# Patient Record
Sex: Female | Born: 1995 | Race: White | Hispanic: No | Marital: Single | Smoking: Former smoker
Health system: Southern US, Community
[De-identification: ages and names within clinical notes are randomized; demographics above are authoritative.]

## PROBLEM LIST (undated history)

## (undated) DIAGNOSIS — K589 Irritable bowel syndrome without diarrhea: Secondary | ICD-10-CM

## (undated) DIAGNOSIS — F419 Anxiety disorder, unspecified: Secondary | ICD-10-CM

## (undated) DIAGNOSIS — G709 Myoneural disorder, unspecified: Secondary | ICD-10-CM

## (undated) DIAGNOSIS — R200 Anesthesia of skin: Secondary | ICD-10-CM

## (undated) DIAGNOSIS — M351 Other overlap syndromes: Secondary | ICD-10-CM

## (undated) DIAGNOSIS — F32A Depression, unspecified: Secondary | ICD-10-CM

## (undated) HISTORY — DX: Anesthesia of skin: R20.0

## (undated) HISTORY — DX: Depression, unspecified: F32.A

## (undated) HISTORY — DX: Other overlap syndromes: M35.1

## (undated) HISTORY — DX: Myoneural disorder, unspecified: G70.9

## (undated) HISTORY — DX: Irritable bowel syndrome, unspecified: K58.9

## (undated) HISTORY — PX: TONSILLECTOMY AND ADENOIDECTOMY: SUR1326

## (undated) HISTORY — PX: HERNIA REPAIR: SHX51

## (undated) HISTORY — DX: Anxiety disorder, unspecified: F41.9

---

## 2013-04-28 ENCOUNTER — Ambulatory Visit: Payer: Self-pay | Admitting: Otolaryngology

## 2013-04-29 LAB — PATHOLOGY REPORT

## 2013-09-08 ENCOUNTER — Ambulatory Visit: Payer: Self-pay | Admitting: Internal Medicine

## 2015-03-23 NOTE — Op Note (Signed)
PATIENT NAME:  Joanne Wallace, [name] MR#:  [id number] DATE OF BIRTH:  Oct 08, 1996  DATE OF PROCEDURE:  04/28/2013  SURGEON:  Zackery BarefootJ. Madison Zinedine Ellner, MD   PREOPERATIVE DIAGNOSIS: Adenotonsillar hypertrophy with chronic adenotonsillitis.   POSTOPERATIVE DIAGNOSIS: Adenotonsillar hypertrophy with chronic adenotonsillitis.   PROCEDURE:  Tonsillectomy and adenoidectomy.   ANESTHESIA:  General endotracheal.  DESCRIPTION OF THE PROCEDURE:  The patient was identified in the holding area and taken to the operating room and placed in the supine position.  After general endotracheal anesthesia, the table was turned 45 degrees and the patient was draped in the usual fashion for a tonsillectomy.  A mouth gag was inserted into the oral cavity and examination of the oropharynx showed the uvula was non-bifid.  There was no evidence of submucous cleft to the palate.  A red rubber catheter was placed through the nostril.  Examination of the nasopharynx showed large obstructing adenoids.  Under indirect vision with the mirror, an adenotome was placed in the nasopharynx.  The adenoids were curetted free.  Reinspection with a mirror showed excellent removal of the adenoid.  Nasopharyngeal packs were then placed.  The nasopharyngeal packs were removed.  Suction cautery was then used to cauterize the nasopharyngeal bed to prevent bleeding.  The red rubber catheter was removed with no active bleeding.  The patient tolerated the procedure well and was awakened in the operating room and taken to the recovery room in stable condition.   CULTURES:  None.  SPECIMENS:  Adenoids.  ESTIMATED BLOOD LOSS:  Less than 10 ml.      FINDINGS: The adenoids were 3+, tonsils were 4+, chronic cryptic purulence was expressed with the tonsil tenaculum. Total amount of local used was 5 mL.   DESCRIPTION OF THE PROCEDURE: The patient was identified in the holding area and taken to the operating room and placed in the supine position.  After general  endotracheal anesthesia, the table was turned 45 degrees and the patient was draped in the usual fashion for a tonsillectomy.  A mouth gag was inserted into the oral cavity and examination of the oropharynx showed the uvula was non-bifid.  There was no evidence of submucous cleft to the palate.  There were large tonsils.  Beginning on the left-hand side a tenaculum was used to grasp the tonsil and the Bovie cautery was used to dissect it free from the fossa.  In a similar fashion, the right tonsil was removed.  Meticulous hemostasis was achieved using the Bovie cautery.  With both tonsils removed and no active bleeding, 0.5% plain Marcaine was used to inject the anterior and posterior tonsillar pillars bilaterally.  A total of 5 mL was used.  The patient tolerated the procedure well and was awakened in the operating room and taken to the recovery room in stable condition.   CULTURES:  None.  SPECIMENS:  Tonsils.  ESTIMATED BLOOD LOSS:  Less than 10 mL.     ____________________________ J. Gertie BaronMadison Hiawatha Dressel, MD [id number] D: 04/28/2013 15:27:07 ET T: 04/29/2013 02:04:59 ET JOB#: [id number]  cc: Zackery BarefootJ. Madison Omarrion Carmer, MD, <Dictator> Wendee CoppJMADISON Kemi Gell MD ELECTRONICALLY SIGNED 05/25/2013 7:45

## 2019-05-02 ENCOUNTER — Ambulatory Visit (INDEPENDENT_AMBULATORY_CARE_PROVIDER_SITE_OTHER): Payer: Commercial Managed Care - PPO | Admitting: Internal Medicine

## 2019-05-02 ENCOUNTER — Other Ambulatory Visit: Payer: Self-pay

## 2019-05-02 ENCOUNTER — Encounter: Payer: Self-pay | Admitting: Internal Medicine

## 2019-05-02 DIAGNOSIS — Z13220 Encounter for screening for lipoid disorders: Secondary | ICD-10-CM | POA: Diagnosis not present

## 2019-05-02 DIAGNOSIS — K589 Irritable bowel syndrome without diarrhea: Secondary | ICD-10-CM | POA: Diagnosis not present

## 2019-05-02 DIAGNOSIS — N926 Irregular menstruation, unspecified: Secondary | ICD-10-CM | POA: Diagnosis not present

## 2019-05-02 DIAGNOSIS — R5383 Other fatigue: Secondary | ICD-10-CM | POA: Diagnosis not present

## 2019-05-02 NOTE — Progress Notes (Signed)
Patient ID: Joanne Wallace, female   DOB: [birth year], 23 y.o.   MRN: [id number]   Virtual Visit via video Note  This visit type was conducted due to national recommendations for restrictions regarding the COVID-19 pandemic (e.g. social distancing).  This format is felt to be most appropriate for this patient at this time.  All issues noted in this document were discussed and addressed.  No physical exam was performed (except for noted visual exam findings with Video Visits).   I connected with Joanne Wallace by a video enabled telemedicine application and verified that I am speaking with the correct person using two identifiers. Location patient: home Location provider: work  Persons participating in the virtual visit: patient, provider  I discussed the limitations, risks, security and privacy concerns of performing an evaluation and management service by video and the availability of in person appointments.. The patient expressed understanding and agreed to proceed.   Reason for visit: establish care  HPI: She recently moved back home a couple of months ago.  Working at [employer].  Tries to stay active.  No chest pain.  No sob.  No acid reflux.  No abdominal pain.  Bowels moving.  Has regular periods.  States - occurs every 31 days.  Increased cramping.  Takes alleve.  Heavy cycles.  States has pain with ovulating.  Request further evaluation for possible endometriosis.  Discussed gyn evaluation.  Has a diagnosis of IBS.  Has previously been prescribed amitriptyline.  Did not tolerate.  Changed diet and now bowels are doing better.  Smoked from age [age].  Stopped smoking now.  Does smoke marijuana.     ROS: See pertinent positives and negatives per HPI.  Past Medical History:  Diagnosis Date  . IBS (irritable bowel syndrome)     Past Surgical History:  Procedure Laterality Date  . HERNIA REPAIR    . TONSILLECTOMY AND ADENOIDECTOMY      Family History  Problem  Relation Age of Onset  . Breast cancer Mother     SOCIAL HX: reviewed.    Current Outpatient Medications:  .  levocetirizine (XYZAL) 5 MG tablet, Take 5 mg by mouth every evening., Disp: , Rfl:   EXAM:  GENERAL: alert.  Answering questions appropriately.  Sounds to be in no acute distress.    PSYCH/NEURO: pleasant and cooperative, no obvious depression or anxiety, speech and thought processing grossly intact  ASSESSMENT AND PLAN:  Discussed the following assessment and plan:  Screening cholesterol level - Plan: Lipid panel  Irritable bowel syndrome, unspecified type  Menstrual changes - Plan: TSH  Other fatigue - Plan: CBC with Differential/Platelet, Comprehensive metabolic panel  IBS (irritable bowel syndrome) Doing better since she adjusted her diet.  Follow.    Menstrual changes Increased pain, cramping and bleeding with her menstrual cycle.  Request referral to gyn.  Has never had pap smear.   Fatigue Check routine labs.      I discussed the assessment and treatment plan with the patient. The patient was provided an opportunity to ask questions and all were answered. The patient agreed with the plan and demonstrated an understanding of the instructions.   The patient was advised to call back or seek an in-person evaluation if the symptoms worsen or if the condition fails to improve as anticipated.  I provided 30 minutes of non-face-to-face time during this encounter.   D[place], MD

## 2019-05-07 ENCOUNTER — Encounter: Payer: Self-pay | Admitting: Internal Medicine

## 2019-05-07 DIAGNOSIS — R5383 Other fatigue: Secondary | ICD-10-CM | POA: Insufficient documentation

## 2019-05-07 DIAGNOSIS — N926 Irregular menstruation, unspecified: Secondary | ICD-10-CM | POA: Insufficient documentation

## 2019-05-07 DIAGNOSIS — K589 Irritable bowel syndrome without diarrhea: Secondary | ICD-10-CM | POA: Insufficient documentation

## 2019-05-07 NOTE — Assessment & Plan Note (Signed)
Check routine labs.   

## 2019-05-07 NOTE — Assessment & Plan Note (Signed)
Increased pain, cramping and bleeding with her menstrual cycle.  Request referral to gyn.  Has never had pap smear.

## 2019-05-07 NOTE — Assessment & Plan Note (Signed)
Doing better since she adjusted her diet.  Follow.

## 2019-06-21 ENCOUNTER — Ambulatory Visit: Payer: Self-pay | Admitting: Internal Medicine

## 2019-08-12 ENCOUNTER — Ambulatory Visit: Payer: Commercial Managed Care - PPO | Admitting: Family Medicine

## 2019-09-07 ENCOUNTER — Ambulatory Visit: Payer: Self-pay | Admitting: Family Medicine

## 2020-06-14 ENCOUNTER — Other Ambulatory Visit: Payer: Self-pay

## 2020-06-14 ENCOUNTER — Encounter: Payer: Self-pay | Admitting: Nurse Practitioner

## 2020-06-14 ENCOUNTER — Ambulatory Visit: Payer: 59 | Admitting: Nurse Practitioner

## 2020-06-14 VITALS — BP 118/72 | HR 82 | Temp 97.7°F | Ht 65.4 in | Wt 161.0 lb

## 2020-06-14 DIAGNOSIS — Z8659 Personal history of other mental and behavioral disorders: Secondary | ICD-10-CM

## 2020-06-14 DIAGNOSIS — Z1159 Encounter for screening for other viral diseases: Secondary | ICD-10-CM

## 2020-06-14 DIAGNOSIS — Z114 Encounter for screening for human immunodeficiency virus [HIV]: Secondary | ICD-10-CM

## 2020-06-14 DIAGNOSIS — M79642 Pain in left hand: Secondary | ICD-10-CM

## 2020-06-14 DIAGNOSIS — Z13228 Encounter for screening for other metabolic disorders: Secondary | ICD-10-CM

## 2020-06-14 DIAGNOSIS — M79641 Pain in right hand: Secondary | ICD-10-CM

## 2020-06-14 DIAGNOSIS — K589 Irritable bowel syndrome without diarrhea: Secondary | ICD-10-CM | POA: Diagnosis not present

## 2020-06-14 NOTE — Progress Notes (Signed)
This visit occurred during the SARS-CoV-2 public health emergency.  Safety protocols were in place, including screening questions prior to the visit, additional usage of staff PPE, and extensive cleaning of exam room while observing appropriate contact time as indicated for disinfecting solutions.  Subjective:     Patient ID: Joanne Wallace , female    DOB: December 13, 1995 , 24 y.o.   MRN: [id number]   Chief Complaint  Patient presents with  . Establish Care    HPI  Here to establish care she found [place] with approved providers with her insurance.  She has recently moved here from [facility]. She has been to see Dr Amedeo Plenty in [facility].  She is working in [employer].  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  Single, she lives with her boyfriend. No children.  She has not seen a GYN.  She is sexually active.  She has had the Covid vaccine.  Her parents live in [place].   She has been exercising more and eating a vegan.   Aunt with Rheumatoid Arthritis, she feels her father has some sort of arthritis. Also multiple aunts with osteoarthritis.   PMH - IBS but has improved since going vegan (no fish or shrimp for the last 2 months), hand pain (she has had improvement as well).  Seasonal depression and advised she is Bipolar but not taking any medications.   She was a smoker for 7 years and has not smoked in 1 year 4 months.     Past Medical History:  Diagnosis Date  . IBS (irritable bowel syndrome)      Family History  Problem Relation Age of Onset  . Breast cancer Mother   . Hyperthyroidism Mother   . Hyperthyroidism Father      Current Outpatient Medications:  .  levocetirizine (XYZAL) 5 MG tablet, Take 5 mg by mouth every evening., Disp: , Rfl:  .  Multiple Vitamin (MULTIVITAMIN WITH MINERALS) TABS tablet, Take 1 tablet by mouth daily., Disp: , Rfl:    Allergies  Allergen Reactions  . Latex Itching     Review of Systems  Constitutional: Negative.  Negative for  fatigue.  Eyes: Negative for visual disturbance.  Respiratory: Negative.  Negative for shortness of breath.   Cardiovascular: Negative.  Negative for chest pain, palpitations and leg swelling.  Gastrointestinal: Negative.   Endocrine: Negative.   Musculoskeletal: Negative.   Skin: Negative.   Neurological: Negative for dizziness, weakness and headaches.  Psychiatric/Behavioral: Negative for confusion. The patient is not nervous/anxious.      Today's Vitals   06/14/20 1551  BP: 118/72  Pulse: 82  Temp: 97.7 F (36.5 C)  TempSrc: Oral  Weight: 161 lb (73 kg)  Height: 5' 5.4" (1.661 m)  PainSc: 0-No pain   Body mass index is 26.47 kg/m.   Objective:  Physical Exam Vitals reviewed.  Constitutional:      General: She is not in acute distress.    Appearance: Normal appearance. She is well-developed.  HENT:     Head: Normocephalic and atraumatic.  Eyes:     Pupils: Pupils are equal, round, and reactive to light.  Cardiovascular:     Rate and Rhythm: Normal rate and regular rhythm.     Pulses: Normal pulses.     Heart sounds: Normal heart sounds. No murmur heard.   Pulmonary:     Effort: Pulmonary effort is normal.     Breath sounds: Normal breath sounds.  Musculoskeletal:  General: Normal range of motion.  Skin:    General: Skin is warm and dry.     Capillary Refill: Capillary refill takes less than 2 seconds.  Neurological:     General: No focal deficit present.     Mental Status: She is alert and oriented to person, place, and time.     Cranial Nerves: No cranial nerve deficit.  Psychiatric:        Mood and Affect: Mood normal.         Assessment And Plan:     1. Irritable bowel syndrome, unspecified type  Reports this as being chronic, which has improved since changing her diet to plant based  2. Bilateral hand pain  Will check autoimmune panel due to family history of rheumatoid arthritis - Autoimmune Profile - Sed Rate (ESR)  3. History of  depression  Would like to go for counseling vs taking medications   She would like to have an established counselor - Ambulatory referral to Psychology  4. Encounter for screening for metabolic disorder - Lipid panel - CMP14+EGFR - CBC  5. Encounter for hepatitis C screening test for low risk patient  Will check Hepatitis C screening due to recent recommendations to screen all adults 18 years and older - Hepatitis C antibody  6. Encounter for screening for HIV - HIV Antibody (routine testing w rflx)     Patient was given opportunity to ask questions. Patient verbalized understanding of the plan and was able to repeat key elements of the plan. All questions were answered to their satisfaction.  Minette Brine, FNP   I, Minette Brine, FNP, have reviewed all documentation for this visit. The documentation on 06/21/20 for the exam, diagnosis, procedures, and orders are all accurate and complete.   THE PATIENT IS ENCOURAGED TO PRACTICE SOCIAL DISTANCING DUE TO THE COVID-19 PANDEMIC.

## 2020-06-15 LAB — CBC
Hematocrit: 40 % (ref 34.0–46.6)
Hemoglobin: 13.9 g/dL (ref 11.1–15.9)
MCH: 29.7 pg (ref 26.6–33.0)
MCHC: 34.8 g/dL (ref 31.5–35.7)
MCV: 86 fL (ref 79–97)
Platelets: 293 10*3/uL (ref 150–450)
RBC: 4.68 x10E6/uL (ref 3.77–5.28)
RDW: 12.5 % (ref 11.7–15.4)
WBC: 7.3 10*3/uL (ref 3.4–10.8)

## 2020-06-15 LAB — CMP14+EGFR
ALT: 9 IU/L (ref 0–32)
AST: 15 IU/L (ref 0–40)
Albumin/Globulin Ratio: 1.6 (ref 1.2–2.2)
Albumin: 4.6 g/dL (ref 3.9–5.0)
Alkaline Phosphatase: 55 IU/L (ref 48–121)
BUN/Creatinine Ratio: 9 (ref 9–23)
BUN: 6 mg/dL (ref 6–20)
Bilirubin Total: 0.4 mg/dL (ref 0.0–1.2)
CO2: 24 mmol/L (ref 20–29)
Calcium: 9.6 mg/dL (ref 8.7–10.2)
Chloride: 103 mmol/L (ref 96–106)
Creatinine, Ser: 0.67 mg/dL (ref 0.57–1.00)
GFR calc Af Amer: 140 mL/min/{1.73_m2} (ref >59)
GFR calc non Af Amer: 122 mL/min/{1.73_m2} (ref >59)
Globulin, Total: 2.8 g/dL (ref 1.5–4.5)
Glucose: 80 mg/dL (ref 65–99)
Potassium: 4.1 mmol/L (ref 3.5–5.2)
Sodium: 139 mmol/L (ref 134–144)
Total Protein: 7.4 g/dL (ref 6.0–8.5)

## 2020-06-15 LAB — AUTOIMMUNE PROFILE
Anti Nuclear Antibody (ANA): POSITIVE — AB
Complement C3, Serum: 117 mg/dL (ref 82–167)
dsDNA Ab: 2 IU/mL (ref 0–9)

## 2020-06-15 LAB — SEDIMENTATION RATE: Sed Rate: 9 mm/hr (ref 0–32)

## 2020-06-15 LAB — HIV ANTIBODY (ROUTINE TESTING W REFLEX): HIV Screen 4th Generation wRfx: NONREACTIVE

## 2020-06-15 LAB — LIPID PANEL
Chol/HDL Ratio: 3.7 ratio (ref 0.0–4.4)
Cholesterol, Total: 139 mg/dL (ref 100–199)
HDL: 38 mg/dL — ABNORMAL LOW (ref >39)
LDL Chol Calc (NIH): 82 mg/dL (ref 0–99)
Triglycerides: 104 mg/dL (ref 0–149)
VLDL Cholesterol Cal: 19 mg/dL (ref 5–40)

## 2020-06-15 LAB — HEPATITIS C ANTIBODY: Hep C Virus Ab: <0.1 s/co ratio (ref 0.0–0.9)

## 2020-06-21 ENCOUNTER — Other Ambulatory Visit: Payer: Self-pay | Admitting: Nurse Practitioner

## 2020-06-21 DIAGNOSIS — R768 Other specified abnormal immunological findings in serum: Secondary | ICD-10-CM

## 2020-08-13 ENCOUNTER — Ambulatory Visit: Payer: Self-pay

## 2020-08-13 ENCOUNTER — Other Ambulatory Visit: Payer: Self-pay

## 2020-08-13 ENCOUNTER — Ambulatory Visit (INDEPENDENT_AMBULATORY_CARE_PROVIDER_SITE_OTHER): Payer: 59 | Admitting: Rheumatology

## 2020-08-13 ENCOUNTER — Encounter: Payer: Self-pay | Admitting: Rheumatology

## 2020-08-13 VITALS — BP 111/71 | HR 75 | Resp 15 | Ht 65.75 in | Wt 163.0 lb

## 2020-08-13 DIAGNOSIS — M79642 Pain in left hand: Secondary | ICD-10-CM | POA: Diagnosis not present

## 2020-08-13 DIAGNOSIS — F329 Major depressive disorder, single episode, unspecified: Secondary | ICD-10-CM

## 2020-08-13 DIAGNOSIS — R768 Other specified abnormal immunological findings in serum: Secondary | ICD-10-CM

## 2020-08-13 DIAGNOSIS — R5383 Other fatigue: Secondary | ICD-10-CM

## 2020-08-13 DIAGNOSIS — M79671 Pain in right foot: Secondary | ICD-10-CM | POA: Diagnosis not present

## 2020-08-13 DIAGNOSIS — M2550 Pain in unspecified joint: Secondary | ICD-10-CM | POA: Diagnosis not present

## 2020-08-13 DIAGNOSIS — F32A Depression, unspecified: Secondary | ICD-10-CM

## 2020-08-13 DIAGNOSIS — M79672 Pain in left foot: Secondary | ICD-10-CM | POA: Diagnosis not present

## 2020-08-13 DIAGNOSIS — F419 Anxiety disorder, unspecified: Secondary | ICD-10-CM

## 2020-08-13 DIAGNOSIS — M79641 Pain in right hand: Secondary | ICD-10-CM

## 2020-08-13 DIAGNOSIS — Z8261 Family history of arthritis: Secondary | ICD-10-CM

## 2020-08-13 DIAGNOSIS — J302 Other seasonal allergic rhinitis: Secondary | ICD-10-CM

## 2020-08-13 DIAGNOSIS — Z8719 Personal history of other diseases of the digestive system: Secondary | ICD-10-CM

## 2020-08-13 DIAGNOSIS — Z7189 Other specified counseling: Secondary | ICD-10-CM

## 2020-08-13 DIAGNOSIS — Z8269 Family history of other diseases of the musculoskeletal system and connective tissue: Secondary | ICD-10-CM

## 2020-08-13 NOTE — Progress Notes (Signed)
Office Visit Note  Patient: Joanne Wallace             Date of Birth: June 10, 1996           MRN: [id number]             PCP: Minette Brine, FNP Referring: Minette Brine, FNP Visit Date: 08/13/2020 Occupation: @GUAROCC @  Subjective:  Pain in multiple joints.   History of Present Illness: Joanne Wallace is a 24 y.o. female seen in consultation per request of her PCP.  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  She states she was working very hard and started experiencing tightness in both of her forearms and fingers.  She states she will experience throbbing pain in the middle of the night.  She had tests for nerve conduction velocity which was negative for carpal tunnel syndrome.  She quit her job in December 2020.  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  She states she is very active there she works on the computer and also lift objects as needed.  She switched her diet to vegan diet in May 2021 which helped to some extent.  She states she continues to have pain and discomfort in multiple joints.  She has pain and swelling in her bilateral hands.  She has noticed discomfort in her elbows, hips, knees and feet.  She has noted swelling in her hands, knee joints and her feet.  She has difficulty making a fist with her hands.  She does yoga for exercise which helps her to some extent.  She has longstanding history of depression and anxiety.  She states she is not seeing a counselor at this time.  There is no history of oral ulcers, nasal ulcers, malar rash, photosensitivity, sicca symptoms, Raynaud's phenomenon or lymphadenopathy.  Paternal aunt has rheumatoid arthritis.  He is gravida 0.  No history of blood clots.  Activities of Daily Living:  Patient  reports morning stiffness for several  hours.   Patient Denies nocturnal pain.  Difficulty dressing/grooming: Denies Difficulty climbing stairs: Reports Difficulty getting out of chair: Reports Difficulty using hands for taps, buttons, cutlery, and/or writing: Reports  Review of Systems  Constitutional: Positive for fatigue. Negative for night sweats, weight gain and weight loss.  HENT: Negative for mouth sores, trouble swallowing, trouble swallowing, mouth dryness and nose dryness.   Eyes: Negative for pain, redness, itching, visual disturbance and dryness.  Respiratory: Negative for cough, shortness of breath and difficulty breathing.   Cardiovascular: Negative for chest pain, palpitations, hypertension, irregular heartbeat and swelling in legs/feet.  Gastrointestinal: Negative for blood in stool, constipation and diarrhea.  Endocrine: Negative for increased urination.  Genitourinary: Negative for difficulty urinating and vaginal dryness.  Musculoskeletal: Positive for arthralgias, joint pain, joint swelling and morning stiffness. Negative for myalgias, muscle weakness, muscle tenderness and myalgias.  Skin: Negative for color change, rash, hair loss, redness, skin tightness, ulcers and sensitivity to sunlight.  Allergic/Immunologic: Negative for susceptible to infections.  Neurological: Positive for weakness. Negative for dizziness, numbness, headaches, memory loss and night sweats.  Hematological: Negative for bruising/bleeding tendency and swollen glands.  Psychiatric/Behavioral: Positive for depressed mood and sleep disturbance. Negative for confusion. The patient is nervous/anxious.     [place]  History:  Patient Active Problem List   Diagnosis Date Noted  . IBS (irritable bowel syndrome) 05/07/2019  . Menstrual changes 05/07/2019  . Fatigue 05/07/2019    Past Medical History:  Diagnosis Date  . IBS (irritable bowel syndrome)     Family History  Problem Relation Age of Onset    . Breast cancer Mother   . Hypothyroidism Mother   . Hypothyroidism Father   . Healthy Brother   . Rheum arthritis Paternal Aunt   . Arthritis Maternal Grandmother   . Osteoarthritis Paternal Aunt    Past Surgical History:  Procedure Laterality Date  . HERNIA REPAIR     double  . TONSILLECTOMY AND ADENOIDECTOMY     Social History   Social History Narrative  . Not on file   Immunization History  Administered Date(s) Administered  . Moderna SARS-COVID-2 Vaccination 02/09/2020, 03/08/2020     Objective: Vital Signs: BP 111/71 (BP Location: Right Arm, Patient Position: Sitting, Cuff Size: Normal)   Pulse 75   Resp 15   Ht 5' 5.75" (1.67 m)   Wt 163 lb (73.9 kg)   BMI 26.51 kg/m    Physical Exam Vitals and nursing note reviewed.  Constitutional:      Appearance: She is well-developed.  HENT:     Head: Normocephalic and atraumatic.  Eyes:     Conjunctiva/sclera: Conjunctivae normal.  Cardiovascular:     Rate and Rhythm: Normal rate and regular rhythm.     Heart sounds: Normal heart sounds.  Pulmonary:     Effort: Pulmonary effort is normal.     Breath sounds: Normal breath sounds.  Abdominal:     General: Bowel sounds are normal.     Palpations: Abdomen is soft.  Musculoskeletal:     Cervical back: Normal range of motion.  Lymphadenopathy:     Cervical: No cervical adenopathy.  Skin:    General: Skin is warm and dry.     Capillary Refill: Capillary refill takes less than 2 seconds.  Neurological:     Mental Status: She is alert and oriented to person, place, and time.  Psychiatric:        Behavior: Behavior normal.      Musculoskeletal Exam: She had good range of motion of cervical, thoracic and lumbar spine.  Shoulder joints, elbow joints, wrist joints, MCPs PIPs and DIPs with good range of motion with no synovitis.  Hip joints, knee joints, ankles, MTPs PIPs and DIPs with good range of motion with no synovitis.  No tenderness was noted on the  examination.  CDAI Exam: CDAI Score: -- Patient Global: --; Provider Global: -- Swollen: --; Tender: -- Joint Exam 08/13/2020   No joint exam has been documented for this visit   There is currently no information documented on the homunculus. Go to the Rheumatology activity and complete the homunculus joint exam.  Investigation: No additional findings.  Imaging: No results found.  Recent Labs: Lab Results  Component Value Date   WBC 7.3 06/14/2020   HGB 13.9 06/14/2020   PLT 293 06/14/2020   NA 139 06/14/2020   K 4.1 06/14/2020   CL 103 06/14/2020   CO2 24 06/14/2020   GLUCOSE 80 06/14/2020   BUN 6 06/14/2020   CREATININE 0.67 06/14/2020   BILITOT 0.4 06/14/2020   ALKPHOS 55 06/14/2020   AST 15 06/14/2020   ALT 9 06/14/2020   PROT 7.4 06/14/2020   ALBUMIN 4.6 06/14/2020   CALCIUM 9.6 06/14/2020   GFRAA 140 06/14/2020  Speciality Comments: No specialty comments available.  Procedures:  No procedures performed Allergies: Latex   Assessment / Plan:     Visit Diagnoses: Positive ANA (antinuclear antibody) - 06/14/20: ANA+, ESR 9, dsDNA 2, C3 117 -she had labs performed by her PCP for history of arthralgias.  ANA is positive but no titer is given.  I will obtain complete panel today.  Plan: Urinalysis, Routine w reflex microscopic, ANA, Anti-scleroderma antibody, RNP Antibody, Anti-Smith antibody, Sjogrens syndrome-A extractable nuclear antibody, Sjogrens syndrome-B extractable nuclear antibody, C3 and C4, Beta-2 glycoprotein antibodies, Cardiolipin antibodies, IgG, IgM, IgA, Lupus Anticoagulant Eval w/Reflex  Pain in both hands -she complains of discomfort in her bilateral hands with intermittent swelling.  No synovitis was noted on my examination today.  I will obtain x-rays and labs today.  Plan: XR Hand 2 View Right, XR Hand 2 View Left, x-ray of bilateral hands were unremarkable.  Rheumatoid factor, Cyclic citrul peptide antibody, IgG  Pain in both feet -pain in  both feet with intermittent swelling palpation.  No synovitis was noted.  Plan: XR Foot 2 Views Right, XR Foot 2 Views Left.  X-ray of bilateral feet were unremarkable.  Polyarthralgia-she complains of discomfort in her bilateral elbows, bilateral hips and bilateral knees.  No warmth swelling effusion was noted.  All the joints were in good range of motion.  Other fatigue -she has been working very hard and gets tired easily.  Plan: CBC with Differential/Platelet, COMPLETE METABOLIC PANEL WITH GFR, CK, Glucose 6 phosphate dehydrogenase  History of IBS-currently not very symptomatic.  Seasonal allergies  Anxiety and depression-she has been to a counselor in the past.  She states she is dealing with anxiety and depression.  She is trying to find a Social worker.  She also gives history of insomnia.  She states she uses marijuana for insomnia.  Family history of rheumatoid arthritis - Paternal aunt  Family history of gout - Father  Educated about COVID-19 virus infection-patient has received both COVID-19 vaccines.  Use of mask, hand hygiene and social distancing was emphasized.  She should get a booster when it is available to her.  Orders: Orders Placed This Encounter  Procedures  . XR Hand 2 View Right  . XR Hand 2 View Left  . XR Foot 2 Views Right  . XR Foot 2 Views Left  . CBC with Differential/Platelet  . COMPLETE METABOLIC PANEL WITH GFR  . Urinalysis, Routine w reflex microscopic  . CK  . Rheumatoid factor  . Cyclic citrul peptide antibody, IgG  . ANA  . Anti-scleroderma antibody  . RNP Antibody  . Anti-Smith antibody  . Sjogrens syndrome-A extractable nuclear antibody  . Sjogrens syndrome-B extractable nuclear antibody  . C3 and C4  . Beta-2 glycoprotein antibodies  . Cardiolipin antibodies, IgG, IgM, IgA  . Lupus Anticoagulant Eval w/Reflex  . Glucose 6 phosphate dehydrogenase   No orders of the defined types were placed in this encounter.     Follow-Up  Instructions: Return for Pain in multiple joints.   Bo Merino, MD  Note - This record has been created using Editor, commissioning.  Chart creation errors have been sought, but may not always  have been located. Such creation errors do not reflect on  the standard of medical care.

## 2020-08-14 NOTE — Progress Notes (Signed)
We will discuss results at the follow-up visit.

## 2020-08-15 ENCOUNTER — Telehealth: Payer: Self-pay | Admitting: Rheumatology

## 2020-08-15 NOTE — Telephone Encounter (Signed)
Patient called to cancel her NPT FU appointment on 09/06/20 due to attending a wedding.  Patient states she is "extremely busy" the month of October and was hoping to combine her follow-up appointment with her ultrasound on 09/26/20.  Patient states if that is not okay she will reschedule the follow-up  in November.  Please advise.

## 2020-08-16 LAB — URINALYSIS, ROUTINE W REFLEX MICROSCOPIC
Bacteria, UA: NONE SEEN /HPF
Bilirubin Urine: NEGATIVE
Glucose, UA: NEGATIVE
Hgb urine dipstick: NEGATIVE
Hyaline Cast: NONE SEEN /LPF
Ketones, ur: NEGATIVE
Nitrite: NEGATIVE
Protein, ur: NEGATIVE
RBC / HPF: NONE SEEN /HPF (ref 0–2)
Specific Gravity, Urine: 1.01 (ref 1.001–1.03)
WBC, UA: NONE SEEN /HPF (ref 0–5)
pH: 7.5 (ref 5.0–8.0)

## 2020-08-16 LAB — COMPLETE METABOLIC PANEL WITH GFR
AG Ratio: 1.7 (calc) (ref 1.0–2.5)
ALT: 10 U/L (ref 6–29)
AST: 14 U/L (ref 10–30)
Albumin: 4.4 g/dL (ref 3.6–5.1)
Alkaline phosphatase (APISO): 52 U/L (ref 31–125)
BUN: 10 mg/dL (ref 7–25)
CO2: 25 mmol/L (ref 20–32)
Calcium: 8.7 mg/dL (ref 8.6–10.2)
Chloride: 107 mmol/L (ref 98–110)
Creat: 0.62 mg/dL (ref 0.50–1.10)
GFR, Est African American: 144 mL/min/{1.73_m2} (ref >=60)
GFR, Est Non African American: 124 mL/min/{1.73_m2} (ref >=60)
Globulin: 2.6 g/dL (calc) (ref 1.9–3.7)
Glucose, Bld: 90 mg/dL (ref 65–99)
Potassium: 4.2 mmol/L (ref 3.5–5.3)
Sodium: 138 mmol/L (ref 135–146)
Total Bilirubin: 0.6 mg/dL (ref 0.2–1.2)
Total Protein: 7 g/dL (ref 6.1–8.1)

## 2020-08-16 LAB — C3 AND C4
C3 Complement: 111 mg/dL (ref 83–193)
C4 Complement: 17 mg/dL (ref 15–57)

## 2020-08-16 LAB — CBC WITH DIFFERENTIAL/PLATELET
Absolute Monocytes: 380 cells/uL (ref 200–950)
Basophils Absolute: 60 cells/uL (ref 0–200)
Basophils Relative: 1.2 %
Eosinophils Absolute: 90 cells/uL (ref 15–500)
Eosinophils Relative: 1.8 %
HCT: 43.6 % (ref 35.0–45.0)
Hemoglobin: 14.5 g/dL (ref 11.7–15.5)
Lymphs Abs: 1290 cells/uL (ref 850–3900)
MCH: 30.4 pg (ref 27.0–33.0)
MCHC: 33.3 g/dL (ref 32.0–36.0)
MCV: 91.4 fL (ref 80.0–100.0)
MPV: 11.9 fL (ref 7.5–12.5)
Monocytes Relative: 7.6 %
Neutro Abs: 3180 cells/uL (ref 1500–7800)
Neutrophils Relative %: 63.6 %
Platelets: 263 10*3/uL (ref 140–400)
RBC: 4.77 10*6/uL (ref 3.80–5.10)
RDW: 12.3 % (ref 11.0–15.0)
Total Lymphocyte: 25.8 %
WBC: 5 10*3/uL (ref 3.8–10.8)

## 2020-08-16 LAB — CK: Total CK: 65 U/L (ref 29–143)

## 2020-08-16 LAB — GLUCOSE 6 PHOSPHATE DEHYDROGENASE: G-6PDH: 13.6 U/g Hgb (ref 7.0–20.5)

## 2020-08-16 LAB — ANTI-SMITH ANTIBODY: ENA SM Ab Ser-aCnc: <1 AI

## 2020-08-16 LAB — CARDIOLIPIN ANTIBODIES, IGG, IGM, IGA
Anticardiolipin IgA: <2 APL-U/mL
Anticardiolipin IgG: <2 GPL-U/mL
Anticardiolipin IgM: <2 MPL-U/mL

## 2020-08-16 LAB — RNP ANTIBODY: Ribonucleic Protein(ENA) Antibody, IgG: 3.2 AI — AB

## 2020-08-16 LAB — ANTI-NUCLEAR AB-TITER (ANA TITER): ANA Titer 1: 1:80 {titer} — ABNORMAL HIGH

## 2020-08-16 LAB — BETA-2 GLYCOPROTEIN ANTIBODIES
Beta-2 Glyco 1 IgA: <2 U/mL
Beta-2 Glyco 1 IgM: <2 U/mL
Beta-2 Glyco I IgG: <2 U/mL

## 2020-08-16 LAB — LUPUS ANTICOAGULANT EVAL W/ REFLEX
PTT-LA Screen: 35 s (ref <=40)
dRVVT: 35 s (ref <=45)

## 2020-08-16 LAB — SJOGRENS SYNDROME-A EXTRACTABLE NUCLEAR ANTIBODY: SSA (Ro) (ENA) Antibody, IgG: 4.3 AI — AB

## 2020-08-16 LAB — ANTI-SCLERODERMA ANTIBODY: Scleroderma (Scl-70) (ENA) Antibody, IgG: <1 AI

## 2020-08-16 LAB — ANA: Anti Nuclear Antibody (ANA): POSITIVE — AB

## 2020-08-16 LAB — SJOGRENS SYNDROME-B EXTRACTABLE NUCLEAR ANTIBODY: SSB (La) (ENA) Antibody, IgG: <1 AI

## 2020-08-16 LAB — RHEUMATOID FACTOR: Rheumatoid fact SerPl-aCnc: <14 IU/mL (ref <14)

## 2020-08-16 LAB — CYCLIC CITRUL PEPTIDE ANTIBODY, IGG: Cyclic Citrullin Peptide Ab: <16 UNITS

## 2020-08-16 NOTE — Telephone Encounter (Signed)
Left voicemail to let patient know her appointment on 09/26/20 has been changed to do both her ultrasound and follow-up at that time.

## 2020-09-06 ENCOUNTER — Ambulatory Visit: Payer: Self-pay | Admitting: Rheumatology

## 2020-09-17 NOTE — Progress Notes (Signed)
Office Visit Note  Patient: Joanne Wallace             Date of Birth: [birth year]           MRN: [id number]             PCP: Minette Brine, FNP Referring: Minette Brine, FNP Visit Date: 09/26/2020 Occupation: _0 @  Subjective:  Fatigue and joint pain.   History of Present Illness: Joanne Wallace is a 24 y.o. female with history of joint pain.  She states she continues to have fatigue and joint pain.  She has discomfort mostly in her hands and feet.  She notices intermittent swelling in her hands.  She is not able to perform certain activities she used to do before.  She has morning stiffness lasting for about 2 hours.  She denies any symptoms of dry eyes but she gives mild symptoms of dry mouth.  Is no history of oral ulcers, nasal ulcers, malar rash, Raynaud's phenomenon, photosensitivity or lymphadenopathy.  Activities of Daily Living:  Patient reports morning stiffness for 2 hours.   Patient Denies nocturnal pain.  Difficulty dressing/grooming: Denies Difficulty climbing stairs: Denies Difficulty getting out of chair: Denies Difficulty using hands for taps, buttons, cutlery, and/or writing: Denies  Review of Systems  Constitutional: Positive for fatigue.  HENT: Positive for mouth dryness.   Eyes: Negative for dryness.  Respiratory: Negative for shortness of breath.   Cardiovascular: Positive for swelling in legs/feet.  Gastrointestinal: Negative for constipation.  Endocrine: Negative for excessive thirst.  Genitourinary: Negative for difficulty urinating.  Musculoskeletal: Positive for arthralgias, gait problem, joint pain, joint swelling, muscle weakness, morning stiffness and muscle tenderness.  Skin: Negative for rash.  Allergic/Immunologic: Negative for susceptible to infections.  Neurological: Positive for numbness and weakness.  Hematological: Positive for bruising/bleeding tendency.  Psychiatric/Behavioral: Negative for sleep disturbance.    PMFS History:    Patient Active Problem List   Diagnosis Date Noted  . Autoimmune disease ([place]) 09/26/2020  . IBS (irritable bowel syndrome) 05/07/2019  . Menstrual changes 05/07/2019  . Fatigue 05/07/2019    Past Medical History:  Diagnosis Date  . IBS (irritable bowel syndrome)     Family History  Problem Relation Age of Onset  . Breast cancer Mother   . Hypothyroidism Mother   . Hypothyroidism Father   . Healthy Brother   . Rheum arthritis Paternal Aunt   . Arthritis Maternal Grandmother   . Osteoarthritis Paternal Aunt    Past Surgical History:  Procedure Laterality Date  . HERNIA REPAIR     double  . TONSILLECTOMY AND ADENOIDECTOMY     Social History   Social History Narrative  . Not on file   Immunization History  Administered Date(s) Administered  . Moderna SARS-COVID-2 Vaccination 02/09/2020, 03/08/2020     Objective: Vital Signs: BP 102/63 (BP Location: Left Arm, Patient Position: Sitting, Cuff Size: Normal)   Pulse 81   Resp 14   Ht _1  (1.651 m)   Wt 163 lb (73.9 kg)   BMI 27.12 kg/m    Physical Exam Vitals and nursing note reviewed.  Constitutional:      Appearance: She is well-developed.  HENT:     Head: Normocephalic and atraumatic.  Eyes:     Conjunctiva/sclera: Conjunctivae normal.  Cardiovascular:     Rate and Rhythm: Normal rate and regular rhythm.     Heart sounds: Normal heart sounds.  Pulmonary:     Effort: Pulmonary effort is normal.  Breath sounds: Normal breath sounds.  Abdominal:     General: Bowel sounds are normal.     Palpations: Abdomen is soft.  Musculoskeletal:     Cervical back: Normal range of motion.  Lymphadenopathy:     Cervical: No cervical adenopathy.  Skin:    General: Skin is warm and dry.     Capillary Refill: Capillary refill takes less than 2 seconds.  Neurological:     Mental Status: She is alert and oriented to person, place, and time.  Psychiatric:        Behavior: Behavior normal.      Musculoskeletal  Exam: C-spine thoracic lumbar spine with good range of motion.  Shoulder joints, elbow joints, wrist joints, MCPs PIPs and DIPs with good range of motion with no synovitis.  Hip joints, knee joints, ankles, MTPs and PIPs with good range of motion with no synovitis.  CDAI Exam: CDAI Score: -- Patient Global: --; Provider Global: -- Swollen: --; Tender: -- Joint Exam 09/26/2020   No joint exam has been documented for this visit   There is currently no information documented on the homunculus. Go to the Rheumatology activity and complete the homunculus joint exam.  Investigation: No additional findings.  Imaging: [place] COMPLETE JOINT SPACE STRUCTURES UP BILAT  Result Date: 09/26/2020 Ultrasound examination of bilateral hands was performed per EULAR recommendations. Using 15 MHz transducer, grayscale and power Doppler bilateral second, third, and fifth MCP joints and bilateral wrist joints both dorsal and volar aspects were evaluated to look for synovitis or tenosynovitis. The findings were there was no synovitis or tenosynovitis on ultrasound examination. Right median nerve was 0.06 cm squares which was in normal limits and left median nerve was 0.04 cm squares which was within normal limits. Impression: Ultrasound examination did not show any synovitis.  Bilateral median nerves within normal limits.   Recent Labs: Lab Results  Component Value Date   WBC 5.0 08/13/2020   HGB 14.5 08/13/2020   PLT 263 08/13/2020   NA 138 08/13/2020   K 4.2 08/13/2020   CL 107 08/13/2020   CO2 25 08/13/2020   GLUCOSE 90 08/13/2020   BUN 10 08/13/2020   CREATININE 0.62 08/13/2020   BILITOT 0.6 08/13/2020   ALKPHOS 55 06/14/2020   AST 14 08/13/2020   ALT 10 08/13/2020   PROT 7.0 08/13/2020   ALBUMIN 4.6 06/14/2020   CALCIUM 8.7 08/13/2020   GFRAA 144 08/13/2020   August 13, 2020 UA trace leukocytes, ANA 1:80 NS, positive, RNP positive, Ro positive) SCL 70, [place], [place] negative) C3-C4 normal,  anticardiolipin negative, beta-2 negative, lupus anticoagulant negative, RF negative, anti-CCP negative, CK 65, G6PD normal  06/14/20: ANA+, ESR 9, dsDNA 2, C3 117   Speciality Comments: No specialty comments available.  Procedures:  No procedures performed Allergies: Latex   Assessment / Plan:     Visit Diagnoses: Autoimmune disease ([place]) - Positive ANA, positive RNP, positive Ro, arthritis.  I detailed discussion with the patient regarding the lab results.  She complains of significant fatigue and sicca symptoms.  Different treatment options and their side effects were discussed.  Indications side effects contraindications of hydroxychloroquine were discussed at length.  Handout was given and consent was taken.  Patient was counseled on the purpose, proper use, and adverse effects of hydroxychloroquine including nausea/diarrhea, skin rash, headaches, and sun sensitivity.  Discussed importance of annual eye exams while on hydroxychloroquine to monitor to ocular toxicity and discussed importance of frequent laboratory monitoring.  Provided patient with  eye exam form for baseline ophthalmologic exam.  Provided patient with educational materials on hydroxychloroquine and answered all questions.  Patient consented to hydroxychloroquine.  Will upload consent in the media tab.    High risk medication use-the plan is to start her on Plaquenil 200 mg twice daily Monday to Friday.  We will check labs in a month and then every 3 months.  If labs are stable will move them to every 5 months.  She will get a baseline examination and then yearly examination.  Pain in both hands - History of intermittent swelling.  X-rays were unremarkable at the last visit. - Plan: [place] COMPLETE JOINT SPACE STRUCTURES UP BILAT.  Ultrasound of bilateral hands today did not show any synovitis.  Findings were discussed with the patient.  Bilateral median nerves within normal limits.  Pain in both feet -no synovitis was noted on the  examination today.  History of intermittent swelling.  X-rays were unremarkable at the last visit.  Other fatigue - Patient complains of extreme fatigue.  History of IBS  Anxiety and depression  Seasonal allergies  Family history of rheumatoid arthritis - Paternal aunt  Family history of gout - Father  Educated about COVID-19 virus infection-information was placed in the AVS and booster, use of mask, social distancing and hand hygiene was discussed.  Orders: Orders Placed This Encounter  Procedures  . [place] COMPLETE JOINT SPACE STRUCTURES UP BILAT   Meds ordered this encounter  Medications  . hydroxychloroquine (PLAQUENIL) 200 MG tablet    Sig: Take 214m by mouth twice daily, Monday through Friday only.    Dispense:  40 tablet    Refill:  2      Follow-Up Instructions: Return in about 6 weeks (around 11/07/2020) for Autoimmune disease.   SBo Merino MD  Note - This record has been created using DEditor, commissioning  Chart creation errors have been sought, but may not always  have been located. Such creation errors do not reflect on  the standard of medical care.

## 2020-09-26 ENCOUNTER — Other Ambulatory Visit: Payer: Self-pay

## 2020-09-26 ENCOUNTER — Ambulatory Visit: Payer: Self-pay

## 2020-09-26 ENCOUNTER — Ambulatory Visit: Payer: 59 | Admitting: Rheumatology

## 2020-09-26 ENCOUNTER — Other Ambulatory Visit: Payer: Self-pay | Admitting: *Deleted

## 2020-09-26 ENCOUNTER — Encounter: Payer: Self-pay | Admitting: Rheumatology

## 2020-09-26 VITALS — BP 102/63 | HR 81 | Resp 14 | Ht 65.0 in | Wt 163.0 lb

## 2020-09-26 DIAGNOSIS — M79672 Pain in left foot: Secondary | ICD-10-CM

## 2020-09-26 DIAGNOSIS — Z8269 Family history of other diseases of the musculoskeletal system and connective tissue: Secondary | ICD-10-CM

## 2020-09-26 DIAGNOSIS — M79671 Pain in right foot: Secondary | ICD-10-CM

## 2020-09-26 DIAGNOSIS — Z7189 Other specified counseling: Secondary | ICD-10-CM

## 2020-09-26 DIAGNOSIS — Z8261 Family history of arthritis: Secondary | ICD-10-CM

## 2020-09-26 DIAGNOSIS — M79641 Pain in right hand: Secondary | ICD-10-CM | POA: Diagnosis not present

## 2020-09-26 DIAGNOSIS — M79642 Pain in left hand: Secondary | ICD-10-CM

## 2020-09-26 DIAGNOSIS — R5383 Other fatigue: Secondary | ICD-10-CM

## 2020-09-26 DIAGNOSIS — Z79899 Other long term (current) drug therapy: Secondary | ICD-10-CM

## 2020-09-26 DIAGNOSIS — Z8719 Personal history of other diseases of the digestive system: Secondary | ICD-10-CM

## 2020-09-26 DIAGNOSIS — F419 Anxiety disorder, unspecified: Secondary | ICD-10-CM

## 2020-09-26 DIAGNOSIS — M359 Systemic involvement of connective tissue, unspecified: Secondary | ICD-10-CM

## 2020-09-26 DIAGNOSIS — J302 Other seasonal allergic rhinitis: Secondary | ICD-10-CM

## 2020-09-26 DIAGNOSIS — F32A Depression, unspecified: Secondary | ICD-10-CM

## 2020-09-26 MED ORDER — HYDROXYCHLOROQUINE SULFATE 200 MG PO TABS: ORAL_TABLET | ORAL | 2 refills | Status: DC

## 2020-09-26 NOTE — Patient Instructions (Addendum)
Hydroxychloroquine tablets What is this medicine? HYDROXYCHLOROQUINE (hye drox ee KLOR oh kwin) is used to treat rheumatoid arthritis and systemic lupus erythematosus. It is also used to treat malaria. This medicine may be used for other purposes; ask your health care provider or pharmacist if you have questions. COMMON BRAND NAME(S): Plaquenil, Quineprox What should I tell my health care provider before I take this medicine? They need to know if you have any of these conditions:  diabetes  eye disease, vision problems  G6PD deficiency  heart disease  history of irregular heartbeat  if you often drink alcohol  kidney disease  liver disease  porphyria  psoriasis  an unusual or allergic reaction to chloroquine, hydroxychloroquine, other medicines, foods, dyes, or preservatives  pregnant or trying to get pregnant  breast-feeding How should I use this medicine? Take this medicine by mouth with a glass of water. Follow the directions on the prescription label. Do not cut, crush or chew this medicine. Swallow the tablets whole. Take this medicine with food. Avoid taking antacids within 4 hours of taking this medicine. It is best to separate these medicines by at least 4 hours. Take your medicine at regular intervals. Do not take it more often than directed. Take all of your medicine as directed even if you think you are better. Do not skip doses or stop your medicine early. Talk to your pediatrician regarding the use of this medicine in children. While this drug may be prescribed for selected conditions, precautions do apply. Overdosage: If you think you have taken too much of this medicine contact a poison control center or emergency room at once. NOTE: This medicine is only for you. Do not share this medicine with others. What if I miss a dose? If you miss a dose, take it as soon as you can. If it is almost time for your next dose, take only that dose. Do not take double or extra  doses. What may interact with this medicine? Do not take this medicine with any of the following medications:  cisapride  dronedarone  pimozide  thioridazine This medicine may also interact with the following medications:  ampicillin  antacids  cimetidine  cyclosporine  digoxin  kaolin  medicines for diabetes, like insulin, glipizide, glyburide  medicines for seizures like carbamazepine, phenobarbital, phenytoin  mefloquine  methotrexate  other medicines that prolong the QT interval (cause an abnormal heart rhythm)  praziquantel This list may not describe all possible interactions. Give your health care provider a list of all the medicines, herbs, non-prescription drugs, or dietary supplements you use. Also tell them if you smoke, drink alcohol, or use illegal drugs. Some items may interact with your medicine. What should I watch for while using this medicine? Visit your health care professional for regular checks on your progress. Tell your health care professional if your symptoms do not start to get better or if they get worse. You may need blood work done while you are taking this medicine. If you take other medicines that can affect heart rhythm, you may need more testing. Talk to your health care professional if you have questions. Your vision may be tested before and during use of this medicine. Tell your health care professional right away if you have any change in your eyesight. What side effects may I notice from receiving this medicine? Side effects that you should report to your doctor or health care professional as soon as possible:  allergic reactions like skin rash, itching or hives,   swelling of the face, lips, or tongue  changes in vision  decreased hearing or ringing of the ears  muscle weakness  redness, blistering, peeling or loosening of the skin, including inside the mouth  sensitivity to light  signs and symptoms of a dangerous change in  heartbeat or heart rhythm like chest pain; dizziness; fast or irregular heartbeat; palpitations; feeling faint or lightheaded, falls; breathing problems  signs and symptoms of liver injury like dark yellow or brown urine; general ill feeling or flu-like symptoms; light-colored stools; loss of appetite; nausea; right upper belly pain; unusually weak or tired; yellowing of the eyes or skin  signs and symptoms of low blood sugar such as feeling anxious; confusion; dizziness; increased hunger; unusually weak or tired; sweating; shakiness; cold; irritable; headache; blurred vision; fast heartbeat; loss of consciousness  suicidal thoughts  uncontrollable head, mouth, neck, arm, or leg movements Side effects that usually do not require medical attention (report to your doctor or health care professional if they continue or are bothersome):  diarrhea  dizziness  hair loss  headache  irritable  loss of appetite  nausea, vomiting  stomach pain This list may not describe all possible side effects. Call your doctor for medical advice about side effects. You may report side effects to FDA at 1-800-FDA-1088. Where should I keep my medicine? Keep out of the reach of children. Store at room temperature between 15 and 30 degrees C (59 and 86 degrees F). Protect from moisture and light. Throw away any unused medicine after the expiration date. NOTE: This sheet is a summary. It may not cover all possible information. If you have questions about this medicine, talk to your doctor, pharmacist, or health care provider.  2020 Elsevier/Gold Standard (2019-03-28 12:56:32)  Standing Labs We placed an order today for your standing lab work.   Please have your standing labs drawn in 1 month, 3 months and then every 5 months  If possible, please have your labs drawn 2 weeks prior to your appointment so that the provider can discuss your results at your appointment.  We have open lab daily Monday through  Thursday from 8:30-12:30 PM and 1:30-4:30 PM and Friday from 8:30-12:30 PM and 1:30-4:00 PM at the office of Dr. Pollyann Savoy, [facility].   Please be advised, patients with office appointments requiring lab work will take precedents over walk-in lab work.  If possible, please come for your lab work on Monday and Friday afternoons, as you may experience shorter wait times. The office is located at [address], [place], [place] [address] No appointment is necessary.   Labs are drawn by [facility]. Please bring your co-pay at the time of your lab draw.  You may receive a bill from [facility] for your lab work.  If you wish to have your labs drawn at another location, please call the office 24 hours in advance to send orders.  If you have any questions regarding directions or hours of operation,  please call [phone].   As a reminder, please drink plenty of water prior to coming for your lab work. Thanks!    COVID-19 vaccine recommendations:   COVID-19 vaccine is recommended for everyone (unless you are allergic to a vaccine component), even if you are on a medication that suppresses your immune system.   If you are on Methotrexate, Cellcept (mycophenolate), Rinvoq, Harriette Ohara, and Olumiant- hold the medication for 1 week after each vaccine. Hold Methotrexate for 2 weeks after the single dose COVID-19  vaccine.   If you are on Orencia subcutaneous injection - hold medication one week prior to and one week after the first COVID-19 vaccine dose (only).   If you are on Orencia IV infusions- time vaccination administration so that the first COVID-19 vaccination will occur four weeks after the infusion and postpone the subsequent infusion by one week.   If you are on Cyclophosphamide or Rituxan infusions please contact your doctor prior to receiving the COVID-19 vaccine.   Do not take Tylenol or any anti-inflammatory medications (NSAIDs) 24 hours prior to the COVID-19  vaccination.   There is no direct evidence about the efficacy of the COVID-19 vaccine in individuals who are on medications that suppress the immune system.   Even if you are fully vaccinated, and you are on any medications that suppress your immune system, please continue to wear a mask, maintain at least six feet social distance and practice hand hygiene.   If you develop a COVID-19 infection, please contact your PCP or our office to determine if you need monoclonal antibody infusion.  The booster vaccine is now available for immunocompromised patients.   Please see the following web sites for updated information.   [email or web address]

## 2020-10-01 ENCOUNTER — Ambulatory Visit: Payer: Self-pay | Admitting: Rheumatology

## 2020-10-16 ENCOUNTER — Other Ambulatory Visit: Payer: Self-pay

## 2020-10-16 ENCOUNTER — Other Ambulatory Visit (HOSPITAL_COMMUNITY)
Admission: RE | Admit: 2020-10-16 | Discharge: 2020-10-16 | Disposition: A | Discharge status: Home / Self Care | Payer: 59 | Source: Ambulatory Visit | Attending: Nurse Practitioner | Admitting: Nurse Practitioner

## 2020-10-16 ENCOUNTER — Encounter: Payer: Self-pay | Admitting: Nurse Practitioner

## 2020-10-16 ENCOUNTER — Ambulatory Visit: Payer: 59 | Admitting: Nurse Practitioner

## 2020-10-16 VITALS — BP 116/80 | HR 83 | Temp 98.4°F | Ht 65.0 in | Wt 162.2 lb

## 2020-10-16 DIAGNOSIS — Z23 Encounter for immunization: Secondary | ICD-10-CM | POA: Diagnosis not present

## 2020-10-16 DIAGNOSIS — Z0000 Encounter for general adult medical examination without abnormal findings: Secondary | ICD-10-CM

## 2020-10-16 DIAGNOSIS — E786 Lipoprotein deficiency: Secondary | ICD-10-CM

## 2020-10-16 DIAGNOSIS — Z124 Encounter for screening for malignant neoplasm of cervix: Secondary | ICD-10-CM | POA: Insufficient documentation

## 2020-10-16 NOTE — Patient Instructions (Addendum)
Health Maintenance, Female Adopting a healthy lifestyle and getting preventive care are important in promoting health and wellness. Ask your health care provider about:  The right schedule for you to have regular tests and exams.  Things you can do on your own to prevent diseases and keep yourself healthy. What should I know about diet, weight, and exercise? Eat a healthy diet   Eat a diet that includes plenty of vegetables, fruits, low-fat dairy products, and lean protein.  Do not eat a lot of foods that are high in solid fats, added sugars, or sodium. Maintain a healthy weight Body mass index (BMI) is used to identify weight problems. It estimates body fat based on height and weight. Your health care provider can help determine your BMI and help you achieve or maintain a healthy weight. Get regular exercise Get regular exercise. This is one of the most important things you can do for your health. Most adults should:  Exercise for at least 150 minutes each week. The exercise should increase your heart rate and make you sweat (moderate-intensity exercise).  Do strengthening exercises at least twice a week. This is in addition to the moderate-intensity exercise.  Spend less time sitting. Even light physical activity can be beneficial. Watch cholesterol and blood lipids Have your blood tested for lipids and cholesterol at 26 years of age, then have this test every 5 years. Have your cholesterol levels checked more often if:  Your lipid or cholesterol levels are high.  You are older than 26 years of age.  You are at high risk for heart disease. What should I know about cancer screening? Depending on your health history and family history, you may need to have cancer screening at various ages. This may include screening for:  Breast cancer.  Cervical cancer.  Colorectal cancer.  Skin cancer.  Lung cancer. What should I know about heart disease, diabetes, and high blood  pressure? Blood pressure and heart disease  High blood pressure causes heart disease and increases the risk of stroke. This is more likely to develop in people who have high blood pressure readings, are of African descent, or are overweight.  Have your blood pressure checked: ? Every 3-5 years if you are 18-39 years of age. ? Every year if you are 40 years old or older. Diabetes Have regular diabetes screenings. This checks your fasting blood sugar level. Have the screening done:  Once every three years after age 40 if you are at a normal weight and have a low risk for diabetes.  More often and at a younger age if you are overweight or have a high risk for diabetes. What should I know about preventing infection? Hepatitis B If you have a higher risk for hepatitis B, you should be screened for this virus. Talk with your health care provider to find out if you are at risk for hepatitis B infection. Hepatitis C Testing is recommended for:  Everyone born from 1945 through 1965.  Anyone with known risk factors for hepatitis C. Sexually transmitted infections (STIs)  Get screened for STIs, including gonorrhea and chlamydia, if: ? You are sexually active and are younger than 26 years of age. ? You are older than 26 years of age and your health care provider tells you that you are at risk for this type of infection. ? Your sexual activity has changed since you were last screened, and you are at increased risk for chlamydia or gonorrhea. Ask your health care provider if   you are at risk.  Ask your health care provider about whether you are at high risk for HIV. Your health care provider may recommend a prescription medicine to help prevent HIV infection. If you choose to take medicine to prevent HIV, you should first get tested for HIV. You should then be tested every 3 months for as long as you are taking the medicine. Pregnancy  If you are about to stop having your period (premenopausal) and  you may become pregnant, seek counseling before you get pregnant.  Take 400 to 800 micrograms (mcg) of folic acid every day if you become pregnant.  Ask for birth control (contraception) if you want to prevent pregnancy. Osteoporosis and menopause Osteoporosis is a disease in which the bones lose minerals and strength with aging. This can result in bone fractures. If you are 65 years old or older, or if you are at risk for osteoporosis and fractures, ask your health care provider if you should:  Be screened for bone loss.  Take a calcium or vitamin D supplement to lower your risk of fractures.  Be given hormone replacement therapy (HRT) to treat symptoms of menopause. Follow these instructions at home: Lifestyle  Do not use any products that contain nicotine or tobacco, such as cigarettes, e-cigarettes, and chewing tobacco. If you need help quitting, ask your health care provider.  Do not use street drugs.  Do not share needles.  Ask your health care provider for help if you need support or information about quitting drugs. Alcohol use  Do not drink alcohol if: ? Your health care provider tells you not to drink. ? You are pregnant, may be pregnant, or are planning to become pregnant.  If you drink alcohol: ? Limit how much you use to 0-1 drink a day. ? Limit intake if you are breastfeeding.  Be aware of how much alcohol is in your drink. In the [place] one drink equals one 12 oz bottle of beer (355 mL), one 5 oz glass of wine (148 mL), or one 1 oz glass of hard liquor (44 mL). General instructions  Schedule regular health, dental, and eye exams.  Stay current with your vaccines.  Tell your health care provider if: ? You often feel depressed. ? You have ever been abused or do not feel safe at home. Summary  Adopting a healthy lifestyle and getting preventive care are important in promoting health and wellness.  Follow your health care provider's instructions about healthy  diet, exercising, and getting tested or screened for diseases.  Follow your health care provider's instructions on monitoring your cholesterol and blood pressure. This information is not intended to replace advice given to you by your health care provider. Make sure you discuss any questions you have with your health care provider. Document Revised: 11/10/2018 Document Reviewed: 11/10/2018 Elsevier Patient Education  2020 Else[employer]  Influenza Virus Vaccine (Flucelvax) What is this medicine? INFLUENZA VIRUS VACCINE (in floo EN zuh VAHY ruhs vak SEEN) helps to reduce the risk of getting influenza also known as the flu. The vaccine only helps protect you against some strains of the flu. This medicine may be used for other purposes; ask your health care provider or pharmacist if you have questions. COMMON BRAND NAME(S): FLUCELVAX What should I tell my health care provider before I take this medicine? They need to know if you have any of these conditions:  bleeding disorder like hemophilia  fever or infection  Guillain-Barre syndrome or other neurological problems    immune system problems  infection with the human immunodeficiency virus (HIV) or AIDS  low blood platelet counts  multiple sclerosis  an unusual or allergic reaction to influenza virus vaccine, other medicines, foods, dyes or preservatives  pregnant or trying to get pregnant  breast-feeding How should I use this medicine? This vaccine is for injection into a muscle. It is given by a health care professional. A copy of Vaccine Information Statements will be given before each vaccination. Read this sheet carefully each time. The sheet may change frequently. Talk to your pediatrician regarding the use of this medicine in children. Special care may be needed. Overdosage: If you think you've taken too much of this medicine contact a poison control center or emergency room at once. Overdosage: If you think you have taken  too much of this medicine contact a poison control center or emergency room at once. NOTE: This medicine is only for you. Do not share this medicine with others. What if I miss a dose? This does not apply. What may interact with this medicine?  chemotherapy or radiation therapy  medicines that lower your immune system like etanercept, anakinra, infliximab, and adalimumab  medicines that treat or prevent blood clots like warfarin  phenytoin  steroid medicines like prednisone or cortisone  theophylline  vaccines This list may not describe all possible interactions. Give your health care provider a list of all the medicines, herbs, non-prescription drugs, or dietary supplements you use. Also tell them if you smoke, drink alcohol, or use illegal drugs. Some items may interact with your medicine. What should I watch for while using this medicine? Report any side effects that do not go away within 3 days to your doctor or health care professional. Call your health care provider if any unusual symptoms occur within 6 weeks of receiving this vaccine. You may still catch the flu, but the illness is not usually as bad. You cannot get the flu from the vaccine. The vaccine will not protect against colds or other illnesses that may cause fever. The vaccine is needed every year. What side effects may I notice from receiving this medicine? Side effects that you should report to your doctor or health care professional as soon as possible:  allergic reactions like skin rash, itching or hives, swelling of the face, lips, or tongue Side effects that usually do not require medical attention (Report these to your doctor or health care professional if they continue or are bothersome.):  fever  headache  muscle aches and pains  pain, tenderness, redness, or swelling at the injection site  tiredness This list may not describe all possible side effects. Call your doctor for medical advice about side  effects. You may report side effects to FDA at 1-800-FDA-1088. Where should I keep my medicine? The vaccine will be given by a health care professional in a clinic, pharmacy, doctor's office, or other health care setting. You will not be given vaccine doses to store at home. NOTE: This sheet is a summary. It may not cover all possible information. If you have questions about this medicine, talk to your doctor, pharmacist, or health care provider.  2020 Elsevier/Gold Standard (2011-10-29 14:06:47)  

## 2020-10-16 NOTE — Progress Notes (Signed)
I,Yamilka Roman [employer] as a Neurosurgeon for [employer], FNP.,have documented all relevant documentation on the behalf of [name], FNP,as directed by  [name], FNP while in the presence of [name], FNP. This visit occurred during the SARS-CoV-2 public health emergency.  Safety protocols were in place, including screening questions prior to the visit, additional usage of staff PPE, and extensive cleaning of exam room while observing appropriate contact time as indicated for disinfecting solutions.  Subjective:     Patient ID: Joanne Wallace , female    DOB: 1996-01-23 , 24 y.o.   MRN: [id number]   Chief Complaint  Patient presents with  . Annual Exam    HPI  Patient here for HM.  She has seen Dr Loni Beckwith and has "some type of mixed connective tissue disorder", now taking plaquenil and her eye appt is in February.   Wt Readings from Last 3 Encounters: 10/16/20 : 162 lb 3.2 oz (73.6 kg) 09/26/20 : 163 lb (73.9 kg) 08/13/20 : 163 lb (73.9 kg)    Past Medical History:  Diagnosis Date  . IBS (irritable bowel syndrome)      Family History  Problem Relation Age of Onset  . Breast cancer Mother   . Hypothyroidism Mother   . Hypothyroidism Father   . Healthy Brother   . Rheum arthritis Paternal Aunt   . Arthritis Maternal Grandmother   . Osteoarthritis Paternal Aunt      Current Outpatient Medications:  .  hydroxychloroquine (PLAQUENIL) 200 MG tablet, Take 200mg  by mouth twice daily, Monday through Friday only., Disp: 40 tablet, Rfl: 2 .  Multiple Vitamin (MULTIVITAMIN WITH MINERALS) TABS tablet, Take 1 tablet by mouth daily., Disp: , Rfl:  .  TURMERIC PO, Take by mouth as needed., Disp: , Rfl:  .  levocetirizine (XYZAL) 5 MG tablet, Take 5 mg by mouth as needed.  (Patient not taking: Reported on 10/16/2020), Disp: , Rfl:    Allergies  Allergen Reactions  . Latex Itching      The patient states she uses condoms for birth control.  Patient's last menstrual  period was 10/03/2020.. Negative for Dysmenorrhea and Negative for Menorrhagia Negative for: breast discharge, breast lump(s), breast pain and breast self exam. Associated symptoms include abnormal vaginal bleeding. Pertinent negatives include abnormal bleeding (hematology), anxiety, decreased libido, depression, difficulty falling sleep, dyspareunia, history of infertility, nocturia, sexual dysfunction, sleep disturbances, urinary incontinence, urinary urgency, vaginal discharge and vaginal itching. Diet: vegan.  The patient states her exercise level is minimal, since being promoted she is working more.  She manages a bed and breakfast.    The patient's tobacco use is:  Social History   Tobacco Use  Smoking Status Former Smoker  . Packs/day: 0.25  . Years: 7.00  . Pack years: 1.75  . Types: Cigarettes  . Quit date: 2019  . Years since quitting: 2.8  Smokeless Tobacco Never Used   She has been exposed to passive smoke. The patient's alcohol use is:  Social History   Substance and Sexual Activity  Alcohol Use Yes   Comment: occ   Additional information: she has never had a PAP, next one scheduled for today.    Review of Systems  Constitutional: Negative.   HENT: Negative.   Eyes: Negative.   Respiratory: Negative.   Cardiovascular: Negative.   Gastrointestinal: Negative.   Endocrine: Negative.   Genitourinary: Negative.   Musculoskeletal: Negative.   Skin: Negative.   Allergic/Immunologic: Negative.   Neurological: Negative.   Hematological: Negative.  Psychiatric/Behavioral: Negative.      Today's Vitals   10/16/20 1444  BP: 116/80  Pulse: 83  Temp: 98.4 F (36.9 C)  TempSrc: Oral  Weight: 162 lb 3.2 oz (73.6 kg)  Height: 5\' 5"  (1.651 m)  PainSc: 0-No pain   Body mass index is 26.99 kg/m.   Objective:  Physical Exam Vitals reviewed.  Constitutional:      General: She is not in acute distress.    Appearance: Normal appearance. She is well-developed. She is  obese.  HENT:     Head: Normocephalic and atraumatic.     Right Ear: Hearing, tympanic membrane, ear canal and external ear normal. There is no impacted cerumen.     Left Ear: Hearing, tympanic membrane, ear canal and external ear normal. There is no impacted cerumen.     Nose:     Comments: Deferred - masked    Mouth/Throat:     Comments: Deferred - masked Eyes:     General: Lids are normal.     Extraocular Movements: Extraocular movements intact.     Conjunctiva/sclera: Conjunctivae normal.     Pupils: Pupils are equal, round, and reactive to light.     Funduscopic exam:    Right eye: No papilledema.        Left eye: No papilledema.  Neck:     Thyroid: No thyroid mass.     Vascular: No carotid bruit.  Cardiovascular:     Rate and Rhythm: Normal rate and regular rhythm.     Pulses: Normal pulses.     Heart sounds: Normal heart sounds. No murmur heard.   Pulmonary:     Effort: Pulmonary effort is normal.     Breath sounds: Normal breath sounds.  Chest:     Chest wall: No mass.     Breasts: Tanner Score is 5.        Right: Normal. No mass or tenderness.        Left: Normal. No mass or tenderness.  Abdominal:     General: Abdomen is flat. Bowel sounds are normal. There is no distension.     Palpations: Abdomen is soft.     Tenderness: There is no abdominal tenderness.  Genitourinary:    Labia:        Right: No tenderness.        Left: No tenderness.      Vagina: Normal.     Cervix: Normal.     Uterus: Normal.      Adnexa: Right adnexa normal and left adnexa normal.       Right: No mass or tenderness.         Left: No mass or tenderness.       Rectum: Guaiac result negative.  Musculoskeletal:        General: No swelling. Normal range of motion.     Cervical back: Full passive range of motion without pain, normal range of motion and neck supple.     Right lower leg: No edema.     Left lower leg: No edema.  Lymphadenopathy:     Upper Body:     Right upper body: No  supraclavicular, axillary or pectoral adenopathy.     Left upper body: No supraclavicular, axillary or pectoral adenopathy.  Skin:    General: Skin is warm and dry.     Capillary Refill: Capillary refill takes less than 2 seconds.  Neurological:     General: No focal deficit present.     Mental  Status: She is alert and oriented to person, place, and time.     Cranial Nerves: No cranial nerve deficit.     Sensory: No sensory deficit.  Psychiatric:        Mood and Affect: Mood normal.        Behavior: Behavior normal.        Thought Content: Thought content normal.        Judgment: Judgment normal.         Assessment And Plan:     1. Encounter for annual physical exam . Behavior modifications discussed and diet history reviewed.   . Pt will continue to exercise regularly and modify diet with low GI, plant based foods and decrease intake of processed foods.  . Recommend intake of daily multivitamin, Vitamin D, and calcium.  . Recommend for preventive screenings, as well as recommend immunizations that include influenza, TDAP (she is due last done in 2009)  2. Need for influenza vaccination  Influenza vaccine administered  Encouraged to take Tylenol as needed for fever or muscle aches. - Flu Vaccine QUAD 6+ mos PF IM (Fluarix Quad PF)   3. Encounter for Papanicolaou smear of cervix  No abnormal findings on physical exam, this was her first pap - Cytology -Pap Smear  4. Low HDL (under 40) Just under normal, discussed ways to improve by exercising, increasing good fats - Lipid panel  5. Encounter for immunization  Will give tetanus vaccine today while in office. TDAP will be administered to adults 56-41 years old every 10 years. - Tdap vaccine greater than or equal to 7yo IM     Patient was given opportunity to ask questions. Patient verbalized understanding of the plan and was able to repeat key elements of the plan. All questions were answered to their satisfaction.      Jeanell Sparrow, FNP, have reviewed all documentation for this visit. The documentation on 10/16/20 for the exam, diagnosis, procedures, and orders are all accurate and complete.  THE PATIENT IS ENCOURAGED TO PRACTICE SOCIAL DISTANCING DUE TO THE COVID-19 PANDEMIC.

## 2020-10-17 LAB — LIPID PANEL
Chol/HDL Ratio: 2.7 ratio (ref 0.0–4.4)
Cholesterol, Total: 146 mg/dL (ref 100–199)
HDL: 55 mg/dL (ref >39)
LDL Chol Calc (NIH): 73 mg/dL (ref 0–99)
Triglycerides: 97 mg/dL (ref 0–149)
VLDL Cholesterol Cal: 18 mg/dL (ref 5–40)

## 2020-10-19 LAB — CYTOLOGY - PAP: Diagnosis: NEGATIVE

## 2020-10-24 NOTE — Progress Notes (Signed)
Office Visit Note  Patient: Joanne Wallace             Date of Birth: 19-May-1996           MRN: [id number]             PCP: [name], FNP Referring: [name], FNP Visit Date: 11/07/2020 Occupation: @GUAROCC @  Subjective:  Fatigue and joint pain.   History of Present Illness: Joanne Wallace is a 24 y.o. female with history of autoimmune disease.  She was placed on hydroxychloroquine about a month ago.  She states she has not noticed much improvement in her fatigue.  Although she feels the joint inflammation has gone down.  She states she still have discomfort in her hands and feet after doing strenuous activities.  She states she is unable to make a complete fist with her left hand.  There is no history of oral ulcers, nasal ulcers, malar rash, photosensitivity or Raynaud's phenomenon.  She denies any shortness of breath or palpitations.  Activities of Daily Living:  Patient reports morning stiffness for 0 minutes.   Patient Reports nocturnal pain.  Difficulty dressing/grooming: Denies Difficulty climbing stairs: Denies Difficulty getting out of chair: Denies Difficulty using hands for taps, buttons, cutlery, and/or writing: Reports  Review of Systems  Constitutional: Positive for fatigue.  HENT: Negative for mouth sores, mouth dryness and nose dryness.   Eyes: Negative for pain, itching, visual disturbance and dryness.  Respiratory: Negative for cough, hemoptysis, shortness of breath and difficulty breathing.   Cardiovascular: Negative for chest pain, palpitations and swelling in legs/feet.  Gastrointestinal: Negative for abdominal pain, blood in stool, constipation and diarrhea.  Endocrine: Negative for increased urination.  Genitourinary: Negative for painful urination.  Musculoskeletal: Positive for arthralgias, joint pain, myalgias and myalgias. Negative for joint swelling, muscle weakness, morning stiffness and muscle tenderness.  Skin: Negative for color change,  rash, redness and sensitivity to sunlight.  Allergic/Immunologic: Negative for susceptible to infections.  Neurological: Positive for weakness. Negative for dizziness, numbness, headaches and memory loss.  Hematological: Negative for swollen glands.  Psychiatric/Behavioral: Positive for confusion. Negative for sleep disturbance.    PMFS History:  Patient Active Problem List   Diagnosis Date Noted  . Autoimmune disease (HCC) 09/26/2020  . IBS (irritable bowel syndrome) 05/07/2019  . Menstrual changes 05/07/2019  . Fatigue 05/07/2019    Past Medical History:  Diagnosis Date  . IBS (irritable bowel syndrome)     Family History  Problem Relation Age of Onset  . Breast cancer Mother   . Hypothyroidism Mother   . Hypothyroidism Father   . Healthy Brother   . Rheum arthritis Paternal Aunt   . Arthritis Maternal Grandmother   . Osteoarthritis Paternal Aunt    Past Surgical History:  Procedure Laterality Date  . HERNIA REPAIR     double  . TONSILLECTOMY AND ADENOIDECTOMY     Social History   Social History Narrative  . Not on file   Immunization History  Administered Date(s) Administered  . Influenza,inj,Quad PF,6+ Mos 10/16/2020  . Moderna SARS-COVID-2 Vaccination 02/09/2020, 03/08/2020, 11/01/2020  . Tdap 10/16/2020     Objective: Vital Signs: BP 115/79 (BP Location: Left Arm, Patient Position: Sitting, Cuff Size: Small)   Pulse 73   Ht 5' 4.5" (1.638 m)   Wt 163 lb (73.9 kg)   BMI 27.55 kg/m    Physical Exam Vitals and nursing note reviewed.  Constitutional:      Appearance: She is well-developed.  HENT:  Head: Normocephalic and atraumatic.  Eyes:     Conjunctiva/sclera: Conjunctivae normal.  Cardiovascular:     Rate and Rhythm: Normal rate and regular rhythm.     Heart sounds: Normal heart sounds.  Pulmonary:     Effort: Pulmonary effort is normal.     Breath sounds: Normal breath sounds.  Abdominal:     General: Bowel sounds are normal.      Palpations: Abdomen is soft.  Musculoskeletal:     Cervical back: Normal range of motion.  Lymphadenopathy:     Cervical: No cervical adenopathy.  Skin:    General: Skin is warm and dry.     Capillary Refill: Capillary refill takes less than 2 seconds.  Neurological:     Mental Status: She is alert and oriented to person, place, and time.  Psychiatric:        Behavior: Behavior normal.      Musculoskeletal Exam: C-spine thoracic and lumbar spine with good range of motion.  Shoulder joints, elbow joints, wrist joints, MCPs PIPs and DIPs with good range of motion with no synovitis.  Hip joints, knee joints, ankles, MTPs and PIPs with good range of motion with no synovitis.  CDAI Exam: CDAI Score: -- Patient Global: --; Provider Global: -- Swollen: --; Tender: -- Joint Exam 11/07/2020   No joint exam has been documented for this visit   There is currently no information documented on the homunculus. Go to the Rheumatology activity and complete the homunculus joint exam.  Investigation: No additional findings.  Imaging: No results found.  Recent Labs: Lab Results  Component Value Date   WBC 4.8 11/05/2020   HGB 13.8 11/05/2020   PLT 279 11/05/2020   NA 142 11/05/2020   K 4.1 11/05/2020   CL 108 11/05/2020   CO2 23 11/05/2020   GLUCOSE 77 11/05/2020   BUN 10 11/05/2020   CREATININE 0.66 11/05/2020   BILITOT 0.4 11/05/2020   ALKPHOS 55 06/14/2020   AST 12 11/05/2020   ALT 8 11/05/2020   PROT 6.8 11/05/2020   ALBUMIN 4.6 06/14/2020   CALCIUM 9.0 11/05/2020   GFRAA 141 11/05/2020    Speciality Comments: No specialty comments available.  Procedures:  No procedures performed Allergies: Latex   Assessment / Plan:     Visit Diagnoses: Autoimmune disease (HCC) - Positive ANA, positive RNP, positive Ro, arthritis.  She continues to have fatigue.  She denies any joint swelling but continues to have a stiffness in her joints.  She has difficulty making a fist with her  left hand.  We will reevaluate the situation at the next visit.    High risk medication use - Plaquenil 200 mg twice daily Monday to Friday.  Her labs were normal in December 2021.  She states her eye exam is a scheduled for February.  Pain in both hands - History of intermittent swelling.  X-rays were unremarkable.  She has noticed improvement in the swelling.  Pain in both feet - History of intermittent swelling.  X-rays were unremarkable.  She reports improvement in the swelling.  She still have some discomfort.  Other fatigue-she continues to have some fatigue.  History of IBS  Seasonal allergies  Anxiety and depression  Family history of rheumatoid arthritis - Paternal aunt  Family history of gout - Father  Orders: No orders of the defined types were placed in this encounter.  No orders of the defined types were placed in this encounter.    Follow-Up Instructions: Return in about  3 months (around 02/05/2021) for Autoimmune disease.   Pollyann Savoy, MD  Note - This record has been created using Animal nutritionist.  Chart creation errors have been sought, but may not always  have been located. Such creation errors do not reflect on  the standard of medical care.

## 2020-10-30 ENCOUNTER — Ambulatory Visit: Payer: Self-pay | Admitting: Rheumatology

## 2020-11-05 ENCOUNTER — Other Ambulatory Visit: Payer: Self-pay | Admitting: *Deleted

## 2020-11-05 DIAGNOSIS — Z79899 Other long term (current) drug therapy: Secondary | ICD-10-CM

## 2020-11-06 LAB — CBC WITH DIFFERENTIAL/PLATELET
Absolute Monocytes: 336 cells/uL (ref 200–950)
Basophils Absolute: 82 cells/uL (ref 0–200)
Basophils Relative: 1.7 %
Eosinophils Absolute: 317 cells/uL (ref 15–500)
Eosinophils Relative: 6.6 %
HCT: 41.1 % (ref 35.0–45.0)
Hemoglobin: 13.8 g/dL (ref 11.7–15.5)
Lymphs Abs: 1670 cells/uL (ref 850–3900)
MCH: 30.5 pg (ref 27.0–33.0)
MCHC: 33.6 g/dL (ref 32.0–36.0)
MCV: 90.9 fL (ref 80.0–100.0)
MPV: 11.8 fL (ref 7.5–12.5)
Monocytes Relative: 7 %
Neutro Abs: 2395 cells/uL (ref 1500–7800)
Neutrophils Relative %: 49.9 %
Platelets: 279 10*3/uL (ref 140–400)
RBC: 4.52 10*6/uL (ref 3.80–5.10)
RDW: 12 % (ref 11.0–15.0)
Total Lymphocyte: 34.8 %
WBC: 4.8 10*3/uL (ref 3.8–10.8)

## 2020-11-06 LAB — COMPLETE METABOLIC PANEL WITH GFR
AG Ratio: 1.5 (calc) (ref 1.0–2.5)
ALT: 8 U/L (ref 6–29)
AST: 12 U/L (ref 10–30)
Albumin: 4.1 g/dL (ref 3.6–5.1)
Alkaline phosphatase (APISO): 43 U/L (ref 31–125)
BUN: 10 mg/dL (ref 7–25)
CO2: 23 mmol/L (ref 20–32)
Calcium: 9 mg/dL (ref 8.6–10.2)
Chloride: 108 mmol/L (ref 98–110)
Creat: 0.66 mg/dL (ref 0.50–1.10)
GFR, Est African American: 141 mL/min/{1.73_m2} (ref >=60)
GFR, Est Non African American: 122 mL/min/{1.73_m2} (ref >=60)
Globulin: 2.7 g/dL (calc) (ref 1.9–3.7)
Glucose, Bld: 77 mg/dL (ref 65–139)
Potassium: 4.1 mmol/L (ref 3.5–5.3)
Sodium: 142 mmol/L (ref 135–146)
Total Bilirubin: 0.4 mg/dL (ref 0.2–1.2)
Total Protein: 6.8 g/dL (ref 6.1–8.1)

## 2020-11-07 ENCOUNTER — Ambulatory Visit (INDEPENDENT_AMBULATORY_CARE_PROVIDER_SITE_OTHER): Payer: 59 | Admitting: Rheumatology

## 2020-11-07 ENCOUNTER — Encounter: Payer: Self-pay | Admitting: Rheumatology

## 2020-11-07 ENCOUNTER — Other Ambulatory Visit: Payer: Self-pay

## 2020-11-07 VITALS — BP 115/79 | HR 73 | Ht 64.5 in | Wt 163.0 lb

## 2020-11-07 DIAGNOSIS — F419 Anxiety disorder, unspecified: Secondary | ICD-10-CM

## 2020-11-07 DIAGNOSIS — Z8261 Family history of arthritis: Secondary | ICD-10-CM

## 2020-11-07 DIAGNOSIS — M79671 Pain in right foot: Secondary | ICD-10-CM | POA: Diagnosis not present

## 2020-11-07 DIAGNOSIS — J302 Other seasonal allergic rhinitis: Secondary | ICD-10-CM

## 2020-11-07 DIAGNOSIS — Z8269 Family history of other diseases of the musculoskeletal system and connective tissue: Secondary | ICD-10-CM

## 2020-11-07 DIAGNOSIS — R5383 Other fatigue: Secondary | ICD-10-CM

## 2020-11-07 DIAGNOSIS — Z8719 Personal history of other diseases of the digestive system: Secondary | ICD-10-CM

## 2020-11-07 DIAGNOSIS — Z79899 Other long term (current) drug therapy: Secondary | ICD-10-CM | POA: Diagnosis not present

## 2020-11-07 DIAGNOSIS — M359 Systemic involvement of connective tissue, unspecified: Secondary | ICD-10-CM

## 2020-11-07 DIAGNOSIS — M79642 Pain in left hand: Secondary | ICD-10-CM

## 2020-11-07 DIAGNOSIS — M79641 Pain in right hand: Secondary | ICD-10-CM | POA: Diagnosis not present

## 2020-11-07 DIAGNOSIS — F32A Depression, unspecified: Secondary | ICD-10-CM

## 2020-11-07 DIAGNOSIS — M79672 Pain in left foot: Secondary | ICD-10-CM

## 2020-11-07 NOTE — Patient Instructions (Signed)
Standing Labs We placed an order today for your standing lab work.   Please have your standing labs drawn in March and every 5 months  If possible, please have your labs drawn 2 weeks prior to your appointment so that the provider can discuss your results at your appointment.  We have open lab daily Monday through Thursday from 8:30-12:30 PM and 1:30-4:30 PM and Friday from 8:30-12:30 PM and 1:30-4:00 PM at the office of Dr. Daquavion Catala, [facility].   Please be advised, patients with office appointments requiring lab work will take precedents over walk-in lab work.  If possible, please come for your lab work on Monday and Friday afternoons, as you may experience shorter wait times. The office is located at [address], [address], [place], [place] [address] No appointment is necessary.   Labs are drawn by [facility]. Please bring your co-pay at the time of your lab draw.  You may receive a bill from [facility] for your lab work.  If you wish to have your labs drawn at another location, please call the office 24 hours in advance to send orders.  If you have any questions regarding directions or hours of operation,  please call [phone].   As a reminder, please drink plenty of water prior to coming for your lab work. Thanks!  

## 2020-11-14 ENCOUNTER — Ambulatory Visit: Payer: Self-pay | Admitting: Rheumatology

## 2020-12-11 ENCOUNTER — Ambulatory Visit: Payer: Self-pay | Admitting: Rheumatology

## 2021-01-09 ENCOUNTER — Telehealth: Payer: Self-pay

## 2021-01-09 MED ORDER — HYDROXYCHLOROQUINE SULFATE 200 MG PO TABS: ORAL_TABLET | ORAL | 2 refills | Status: DC

## 2021-01-09 NOTE — Telephone Encounter (Signed)
Patient called requesting prescription refill of Plaquenil to be sent to [facility] at [address]

## 2021-01-09 NOTE — Telephone Encounter (Signed)
Last Visit: 11/07/2020 Next Visit: 02/06/2021 Labs: 11/05/2020, CBC and CMP WNL Eye exam: 01/01/2021  Current Dose per office note 11/07/2020, Plaquenil 200 mg twice daily Monday to Friday SH:[id number] disease   Last Fill: 09/26/2020  Okay to refill Plaquenil?

## 2021-01-23 NOTE — Progress Notes (Signed)
Office Visit Note  Patient: Joanne Wallace             Date of Birth: 02-Apr-1996           MRN: [id number]             PCP: [name], FNP Referring: [name], FNP Visit Date: 02/06/2021 Occupation: @GUAROCC @  Subjective:  Medication monitoring   History of Present Illness: Joanne Wallace is a 25 y.o. female with history of autoimmune disease.  She is taking plaquenil 200 mg 1 tablet by mouth twice daily M-F. She was started on Plaquenil in November 2021.  Patient reports that she missed about 3 to 4 weeks of Plaquenil recently due to requiring a baseline Plaquenil eye exam.  Patient reports that while off of Plaquenil she experienced increased joint pain, joint swelling, stiffness, and fatigue.  She reports that she became aware of how effective Plaquenil has been in managing her symptoms.  She has since resumed Plaquenil and is tolerating without any side effects.  She denies any recent rashes, increased hair loss, oral or nasal ulcerations, symptoms of Raynaud's, swollen lymph nodes, palpitations, pleuritic chest pain, or shortness of breath.  She has been taking turmeric on a daily basis as well as performing yoga on a daily basis which she feels has helped with her overall stiffness. She denies any recent infections     Activities of Daily Living:  Patient reports morning stiffness for 1-2 hours.   Patient Denies nocturnal pain.  Difficulty dressing/grooming: Denies Difficulty climbing stairs: Denies Difficulty getting out of chair: Denies Difficulty using hands for taps, buttons, cutlery, and/or writing: Reports  Review of Systems  Constitutional: Positive for fatigue.  HENT: Negative for mouth sores, mouth dryness and nose dryness.   Eyes: Negative for pain, itching and dryness.  Respiratory: Negative for shortness of breath and difficulty breathing.   Cardiovascular: Negative for chest pain and palpitations.  Gastrointestinal: Negative for blood in stool,  constipation and diarrhea.  Endocrine: Negative for increased urination.  Genitourinary: Negative for difficulty urinating.  Musculoskeletal: Positive for arthralgias, joint pain, joint swelling and morning stiffness. Negative for myalgias, muscle tenderness and myalgias.  Skin: Negative for color change, rash and redness.  Allergic/Immunologic: Negative for susceptible to infections.  Neurological: Negative for dizziness, numbness, headaches, memory loss and weakness.  Hematological: Positive for bruising/bleeding tendency.  Psychiatric/Behavioral: Negative for confusion and sleep disturbance.    PMFS History:  Patient Active Problem List   Diagnosis Date Noted  . Autoimmune disease (HCC) 09/26/2020  . IBS (irritable bowel syndrome) 05/07/2019  . Menstrual changes 05/07/2019  . Fatigue 05/07/2019    Past Medical History:  Diagnosis Date  . IBS (irritable bowel syndrome)     Family History  Problem Relation Age of Onset  . Breast cancer Mother   . Hypothyroidism Mother   . Hypothyroidism Father   . Healthy Brother   . Rheum arthritis Paternal Aunt   . Arthritis Maternal Grandmother   . Osteoarthritis Paternal Aunt    Past Surgical History:  Procedure Laterality Date  . HERNIA REPAIR     double  . TONSILLECTOMY AND ADENOIDECTOMY     Social History   Social History Narrative  . Not on file   Immunization History  Administered Date(s) Administered  . Influenza,inj,Quad PF,6+ Mos 10/16/2020  . Moderna Sars-Covid-2 Vaccination 02/09/2020, 03/08/2020, 11/01/2020  . Tdap 10/16/2020     Objective: Vital Signs: BP 113/77 (BP Location: Left Arm, Patient Position: Sitting, Cuff Size:  Normal)   Pulse 80   Resp 15   Ht 5\' 5"  (1.651 m)   Wt 166 lb 6.4 oz (75.5 kg)   BMI 27.69 kg/m    Physical Exam Vitals and nursing note reviewed.  Constitutional:      Appearance: She is well-developed and well-nourished.  HENT:     Head: Normocephalic and atraumatic.  Eyes:      Extraocular Movements: EOM normal.     Conjunctiva/sclera: Conjunctivae normal.  Cardiovascular:     Pulses: Intact distal pulses.  Pulmonary:     Effort: Pulmonary effort is normal.  Abdominal:     Palpations: Abdomen is soft.  Musculoskeletal:     Cervical back: Normal range of motion.  Skin:    General: Skin is warm and dry.     Capillary Refill: Capillary refill takes less than 2 seconds.  Neurological:     Mental Status: She is alert and oriented to person, place, and time.  Psychiatric:        Mood and Affect: Mood and affect normal.        Behavior: Behavior normal.      Musculoskeletal Exam: C-spine, thoracic spine, and lumbar spine have good range of motion with no discomfort.  Shoulder joints, elbow joints, wrist joints, MCPs, PIPs, DIPs have good range of motion with no synovitis.  She is able to make a complete fist bilaterally.  Hip joints have good range of motion with no discomfort.  No tenderness over trochanteric bursa bilaterally.  Knee joints have good range of motion with no warmth or effusion.  Ankle joints have good range of motion with no tenderness or inflammation.   CDAI Exam: CDAI Score: -- Patient Global: --; Provider Global: -- Swollen: --; Tender: -- Joint Exam 02/06/2021   No joint exam has been documented for this visit   There is currently no information documented on the homunculus. Go to the Rheumatology activity and complete the homunculus joint exam.  Investigation: No additional findings.  Imaging: No results found.  Recent Labs: Lab Results  Component Value Date   WBC 4.8 11/05/2020   HGB 13.8 11/05/2020   PLT 279 11/05/2020   NA 142 11/05/2020   K 4.1 11/05/2020   CL 108 11/05/2020   CO2 23 11/05/2020   GLUCOSE 77 11/05/2020   BUN 10 11/05/2020   CREATININE 0.66 11/05/2020   BILITOT 0.4 11/05/2020   ALKPHOS 55 06/14/2020   AST 12 11/05/2020   ALT 8 11/05/2020   PROT 6.8 11/05/2020   ALBUMIN 4.6 06/14/2020   CALCIUM 9.0  11/05/2020   GFRAA 141 11/05/2020    Speciality Comments: PLQ EYE EXAM 01/01/2021 normal [facility] 1 year  Procedures:  No procedures performed Allergies: Latex   Assessment / Plan:     Visit Diagnoses: Autoimmune disease (HCC) - Positive ANA, positive RNP, positive Ro, arthritis: She has noticed significant clinical improvement since starting on Plaquenil initially in November 2021.  According to the patient she recently missed 3 to 4 weeks of Plaquenil while waiting on a baseline Plaquenil eye exam.  While being off of Plaquenil she experienced increased arthralgias, joint stiffness, joint swelling, and fatigue.  She was able to recognize how effective Plaquenil has been at managing her symptoms.  She has since resumed Plaquenil and is tolerating it without any side effects.  She continues to experience occasional arthralgias which is exacerbated by weather changes.  No joint tenderness or synovitis was noted on exam.  She has not  had any recent rashes, symptoms of Raynaud's, increased hair loss, oral or nasal ulcerations, sicca symptoms, symptoms of Raynaud's, enlarged lymph nodes, pleuritic chest pain, palpitations, or shortness of breath.  We will obtain the following lab work today including autoimmune labs since she has not had them recheck since starting on Plaquenil.  She will continue taking Plaquenil 200 mg 1 tablet by mouth twice daily Monday through Friday.  She does not need a refill at this time.  She was advised to notify us if she develops any new or worsening symptoms.  She will follow-up in the office in 5 months- Plan: COMPLETE METABOLIC PANEL WITH GFR, Urinalysis, Routine w reflex microscopic, CBC with Differential/Platelet, Anti-DNA antibody, double-stranded, C3 and C4, Sedimentation rate, RNP Antibody, ANA, Sjogrens syndrome-A extractable nuclear antibody  High risk medication use - Plaquenil 200 mg twice daily Monday to Friday. CBC and CMP updated on 11/05/20.  We  will update CBC and CMP today.  Her next lab work will be due in 5 months which we will obtain at her follow-up visit.  PLQ EYE EXAM 01/01/2021 normal [facility] 1 year.  She has not had any recent infections. - Plan: COMPLETE METABOLIC PANEL WITH GFR, CBC with Differential/Platelet  Pain in both hands - History of intermittent swelling.  X-rays were unremarkable.  She has no joint tenderness or synovitis on exam.  She is able to make a complete fist bilaterally.  She was encouraged to continue to take turmeric on a daily basis.  Joint protection and muscle strengthening were discussed.  Pain in both feet - History of intermittent swelling.  X-rays were unremarkable.  She has intermittent discomfort and stiffness in her feet.  She has good range of motion of both ankle joints with no tenderness or inflammation.  She is wearing proper fitting shoes.  Other fatigue: Overall her energy level has improved since starting on Plaquenil.  She has been practicing yoga on a daily basis for exercise.  Other medical conditions are listed as follows:  History of IBS  Seasonal allergies  Anxiety and depression  Family history of gout - Father.   Family history of rheumatoid arthritis - Paternal aunt  Orders: Orders Placed This Encounter  Procedures  . COMPLETE METABOLIC PANEL WITH GFR  . Urinalysis, Routine w reflex microscopic  . CBC with Differential/Platelet  . Anti-DNA antibody, double-stranded  . C3 and C4  . Sedimentation rate  . RNP Antibody  . ANA  . Sjogrens syndrome-A extractable nuclear antibody   No orders of the defined types were placed in this encounter.    Follow-Up Instructions: Return in about 5 months (around 07/09/2021) for Autoimmune Disease.   Gearldine Bienenstock, PA-C  Note - This record has been created using Dragon software.  Chart creation errors have been sought, but may not always  have been located. Such creation errors do not reflect on  the standard  of medical care.

## 2021-02-06 ENCOUNTER — Ambulatory Visit: Payer: 59 | Admitting: Physician Assistant

## 2021-02-06 ENCOUNTER — Encounter: Payer: Self-pay | Admitting: Physician Assistant

## 2021-02-06 ENCOUNTER — Other Ambulatory Visit: Payer: Self-pay

## 2021-02-06 VITALS — BP 113/77 | HR 80 | Resp 15 | Ht 65.0 in | Wt 166.4 lb

## 2021-02-06 DIAGNOSIS — F419 Anxiety disorder, unspecified: Secondary | ICD-10-CM

## 2021-02-06 DIAGNOSIS — M359 Systemic involvement of connective tissue, unspecified: Secondary | ICD-10-CM

## 2021-02-06 DIAGNOSIS — Z79899 Other long term (current) drug therapy: Secondary | ICD-10-CM | POA: Diagnosis not present

## 2021-02-06 DIAGNOSIS — M79641 Pain in right hand: Secondary | ICD-10-CM

## 2021-02-06 DIAGNOSIS — Z8719 Personal history of other diseases of the digestive system: Secondary | ICD-10-CM

## 2021-02-06 DIAGNOSIS — M79642 Pain in left hand: Secondary | ICD-10-CM

## 2021-02-06 DIAGNOSIS — Z8269 Family history of other diseases of the musculoskeletal system and connective tissue: Secondary | ICD-10-CM

## 2021-02-06 DIAGNOSIS — R5383 Other fatigue: Secondary | ICD-10-CM

## 2021-02-06 DIAGNOSIS — J302 Other seasonal allergic rhinitis: Secondary | ICD-10-CM

## 2021-02-06 DIAGNOSIS — Z8261 Family history of arthritis: Secondary | ICD-10-CM

## 2021-02-06 DIAGNOSIS — F32A Depression, unspecified: Secondary | ICD-10-CM

## 2021-02-06 DIAGNOSIS — M79672 Pain in left foot: Secondary | ICD-10-CM

## 2021-02-06 DIAGNOSIS — M79671 Pain in right foot: Secondary | ICD-10-CM | POA: Diagnosis not present

## 2021-02-07 NOTE — Progress Notes (Signed)
CBC and CMP WNL.  Complements and ESR WNL.   UA revealed trace leukocytes but negative for nitrites and bacteria.

## 2021-02-07 NOTE — Progress Notes (Signed)
Ro antibody and RNP antibody remain positive-stable titers.   dsDNA is negative.   No further recommendations at this time. Continue current dose of plaquenil.

## 2021-02-08 LAB — COMPLETE METABOLIC PANEL WITH GFR
AG Ratio: 1.7 (calc) (ref 1.0–2.5)
ALT: 11 U/L (ref 6–29)
AST: 16 U/L (ref 10–30)
Albumin: 4.8 g/dL (ref 3.6–5.1)
Alkaline phosphatase (APISO): 47 U/L (ref 31–125)
BUN: 7 mg/dL (ref 7–25)
CO2: 25 mmol/L (ref 20–32)
Calcium: 9.7 mg/dL (ref 8.6–10.2)
Chloride: 105 mmol/L (ref 98–110)
Creat: 0.61 mg/dL (ref 0.50–1.10)
GFR, Est African American: 144 mL/min/{1.73_m2} (ref >=60)
GFR, Est Non African American: 124 mL/min/{1.73_m2} (ref >=60)
Globulin: 2.9 g/dL (calc) (ref 1.9–3.7)
Glucose, Bld: 79 mg/dL (ref 65–99)
Potassium: 4.2 mmol/L (ref 3.5–5.3)
Sodium: 140 mmol/L (ref 135–146)
Total Bilirubin: 0.6 mg/dL (ref 0.2–1.2)
Total Protein: 7.7 g/dL (ref 6.1–8.1)

## 2021-02-08 LAB — CBC WITH DIFFERENTIAL/PLATELET
Absolute Monocytes: 422 cells/uL (ref 200–950)
Basophils Absolute: 81 cells/uL (ref 0–200)
Basophils Relative: 1.3 %
Eosinophils Absolute: 43 cells/uL (ref 15–500)
Eosinophils Relative: 0.7 %
HCT: 44.1 % (ref 35.0–45.0)
Hemoglobin: 15.1 g/dL (ref 11.7–15.5)
Lymphs Abs: 1705 cells/uL (ref 850–3900)
MCH: 31.1 pg (ref 27.0–33.0)
MCHC: 34.2 g/dL (ref 32.0–36.0)
MCV: 90.7 fL (ref 80.0–100.0)
MPV: 11.1 fL (ref 7.5–12.5)
Monocytes Relative: 6.8 %
Neutro Abs: 3949 cells/uL (ref 1500–7800)
Neutrophils Relative %: 63.7 %
Platelets: 296 10*3/uL (ref 140–400)
RBC: 4.86 10*6/uL (ref 3.80–5.10)
RDW: 11.8 % (ref 11.0–15.0)
Total Lymphocyte: 27.5 %
WBC: 6.2 10*3/uL (ref 3.8–10.8)

## 2021-02-08 LAB — SEDIMENTATION RATE: Sed Rate: 2 mm/h (ref 0–20)

## 2021-02-08 LAB — URINALYSIS, ROUTINE W REFLEX MICROSCOPIC
Bacteria, UA: NONE SEEN /HPF
Bilirubin Urine: NEGATIVE
Glucose, UA: NEGATIVE
Hgb urine dipstick: NEGATIVE
Hyaline Cast: NONE SEEN /LPF
Ketones, ur: NEGATIVE
Nitrite: NEGATIVE
Protein, ur: NEGATIVE
RBC / HPF: NONE SEEN /HPF (ref 0–2)
Specific Gravity, Urine: 1.01 (ref 1.001–1.03)
pH: 8 (ref 5.0–8.0)

## 2021-02-08 LAB — C3 AND C4
C3 Complement: 125 mg/dL (ref 83–193)
C4 Complement: 19 mg/dL (ref 15–57)

## 2021-02-08 LAB — ANTI-NUCLEAR AB-TITER (ANA TITER): ANA Titer 1: 1:40 {titer} — ABNORMAL HIGH

## 2021-02-08 LAB — RNP ANTIBODY: Ribonucleic Protein(ENA) Antibody, IgG: 2.5 AI — AB

## 2021-02-08 LAB — ANTI-DNA ANTIBODY, DOUBLE-STRANDED: ds DNA Ab: 2 IU/mL

## 2021-02-08 LAB — ANA: Anti Nuclear Antibody (ANA): POSITIVE — AB

## 2021-02-08 LAB — SJOGRENS SYNDROME-A EXTRACTABLE NUCLEAR ANTIBODY: SSA (Ro) (ENA) Antibody, IgG: 4.2 AI — AB

## 2021-02-11 NOTE — Progress Notes (Signed)
ANA is low, nonspecific titer.

## 2021-04-03 ENCOUNTER — Other Ambulatory Visit: Payer: Self-pay | Admitting: Physician Assistant

## 2021-04-03 NOTE — Telephone Encounter (Signed)
Last Visit: 02/06/2021 Next Visit: 07/15/2021 Labs: 02/06/2021, CBC and CMP WNL. Complements and ESR WNL.  UA revealed trace leukocytes but negative for nitrites and bacteria. Ro antibody and RNP antibody remain positive-stable titers.  dsDNA is negative.  No further recommendations at this time. Continue current dose of plaquenil. ANA is low, nonspecific titer. Eye exam: 01/01/2021  Current Dose per office note 02/06/2021, Plaquenil 200 mg twice daily Monday to Friday  DX:  Autoimmune disease   Last Fill: 01/09/2021  Okay to refill Plaquenil?

## 2021-05-13 ENCOUNTER — Telehealth: Payer: 59 | Admitting: Physician Assistant

## 2021-05-13 DIAGNOSIS — J028 Acute pharyngitis due to other specified organisms: Secondary | ICD-10-CM

## 2021-05-13 DIAGNOSIS — B9689 Other specified bacterial agents as the cause of diseases classified elsewhere: Secondary | ICD-10-CM | POA: Diagnosis not present

## 2021-05-13 MED ORDER — AMOXICILLIN 500 MG PO CAPS: 500.0000 mg | ORAL_CAPSULE | Freq: Two times a day (BID) | ORAL | 0 refills | Status: AC

## 2021-05-13 NOTE — Progress Notes (Signed)

## 2021-05-24 ENCOUNTER — Ambulatory Visit (HOSPITAL_COMMUNITY)
Admission: EM | Admit: 2021-05-24 | Discharge: 2021-05-24 | Disposition: A | Discharge status: Home / Self Care | Payer: 59 | Attending: Urgent Care | Admitting: Urgent Care

## 2021-05-24 ENCOUNTER — Encounter (HOSPITAL_COMMUNITY): Payer: Self-pay

## 2021-05-24 ENCOUNTER — Telehealth: Payer: 59 | Admitting: Emergency Medicine

## 2021-05-24 DIAGNOSIS — J029 Acute pharyngitis, unspecified: Secondary | ICD-10-CM | POA: Insufficient documentation

## 2021-05-24 DIAGNOSIS — J028 Acute pharyngitis due to other specified organisms: Secondary | ICD-10-CM | POA: Insufficient documentation

## 2021-05-24 LAB — POCT RAPID STREP A, ED / UC: Streptococcus, Group A Screen (Direct): NEGATIVE

## 2021-05-24 MED ORDER — CLINDAMYCIN HCL 300 MG PO CAPS: 300.0000 mg | ORAL_CAPSULE | Freq: Three times a day (TID) | ORAL | 0 refills | Status: DC

## 2021-05-24 MED ORDER — ACETAMINOPHEN 325 MG PO TABS
ORAL_TABLET | ORAL | Status: AC
  Filled 2021-05-24: qty 2

## 2021-05-24 MED ORDER — ACETAMINOPHEN 325 MG PO TABS
650.0000 mg | ORAL_TABLET | Freq: Once | ORAL | Status: AC
  Administered 2021-05-24: 650 mg via ORAL

## 2021-05-24 MED ORDER — NAPROXEN 500 MG PO TABS: 500.0000 mg | ORAL_TABLET | Freq: Two times a day (BID) | ORAL | 0 refills | Status: DC

## 2021-05-24 NOTE — Progress Notes (Signed)
Based on what you shared with me, worsening sore throat after completing your antibiotics, I feel your condition warrants further evaluation and I recommend that you be seen in a face to face visit for a physical exam and possible testing to help determine the cause of your worsening sore throat, and best treatment plan for you.   NOTE: There will be NO CHARGE for this eVisit   If you are having a true medical emergency please call 911.      For an urgent face to face visit, [place] has six urgent care centers for your convenience:     [facility] at [facility] Directions [phone] [address] [place], [place] [address]    [facility] Center [facility]) Get Driving Directions [phone] [address] [place], [place] [address]  [facility] Urgent Care Center [facility]) Get Driving Directions [phone] [address] [place],  [place]  [address]  [facility] at [facility] Get Driving Directions [phone] [address], [address] [place], [place] [address]   [facility] Health Urgent Care at [facility] Get Driving Directions  [phone] [address] [place], [place] [address]   [facility] Health Urgent Care at [facility] Directions [phone] [address], [address] [place], [place] [address]  Your MyChart E-visit questionnaire answers were reviewed by a board certified advanced clinical practitioner to complete your personal care plan based on your specific symptoms.  Thank you for using e-Visits.   Approximately 5 minutes was spent documenting and reviewing patient's chart.

## 2021-05-24 NOTE — ED Triage Notes (Signed)
Pt c/o sore throat X 1500. She stats she has started to have chills and see tonsils.   She states she has taken Amoxicillin for 10 days. States she has gotten better and now she feels the soreness in her throat again.

## 2021-05-24 NOTE — ED Provider Notes (Signed)
[facility]   MRN: [id number] DOB: 1996-12-01  Subjective:   Joanne Wallace is a 25 y.o. female presenting for 12-day history of persistent and recurrent moderate throat pain with painful swallowing, chills.  Patient has already undergone a 10-day course of amoxicillin and was improving but toward the end her symptoms returned.  Symptoms were acute in onset and not progressive.  Denies runny or stuffy nose, cough, chest pain, shortness of breath, body aches.  No current facility-administered medications for this encounter.  Current Outpatient Medications:    hydroxychloroquine (PLAQUENIL) 200 MG tablet, TAKE 1 TABLET BY MOUTH TWICE DAILY MONDAY THROUGH FRIDAY ONLY., Disp: 40 tablet, Rfl: 2   levocetirizine (XYZAL) 5 MG tablet, Take 5 mg by mouth as needed., Disp: , Rfl:    Multiple Vitamin (MULTIVITAMIN WITH MINERALS) TABS tablet, Take 1 tablet by mouth daily., Disp: , Rfl:    TURMERIC PO, Take by mouth daily., Disp: , Rfl:    Allergies  Allergen Reactions   Latex Itching    Past Medical History:  Diagnosis Date   IBS (irritable bowel syndrome)      Past Surgical History:  Procedure Laterality Date   HERNIA REPAIR     double   TONSILLECTOMY AND ADENOIDECTOMY      Family History  Problem Relation Age of Onset   Breast cancer Mother    Hypothyroidism Mother    Hypothyroidism Father    Healthy Brother    Rheum arthritis Paternal Aunt    Arthritis Maternal Grandmother    Osteoarthritis Paternal Aunt     Social History   Tobacco Use   Smoking status: Former    Packs/day: 0.25    Years: 7.00    Pack years: 1.75    Types: Cigarettes    Quit date: 2019    Years since quitting: 3.4   Smokeless tobacco: Never  Vaping Use   Vaping Use: Never used  Substance Use Topics   Alcohol use: Yes    Comment: occ   Drug use: Yes    Types: Marijuana    Comment: daily    ROS   Objective:   Vitals: BP (!) 148/73 (BP Location: Left Arm)   Pulse (!)  102   Temp (!) 100.6 F (38.1 C) (Oral)   Resp 17   LMP 04/25/2021 (Exact Date)   SpO2 98%   Physical Exam Constitutional:      General: She is not in acute distress.    Appearance: Normal appearance. She is well-developed. She is ill-appearing. She is not toxic-appearing or diaphoretic.  HENT:     Head: Normocephalic and atraumatic.     Nose: Nose normal.     Mouth/Throat:     Mouth: Mucous membranes are moist.     Pharynx: Oropharynx is clear. Posterior oropharyngeal erythema (Overlying posterior pharynx) present. No pharyngeal swelling, oropharyngeal exudate or uvula swelling.     Tonsils: No tonsillar exudate or tonsillar abscesses. 0 on the right. 0 on the left.  Eyes:     General: No scleral icterus.    Extraocular Movements: Extraocular movements intact.     Pupils: Pupils are equal, round, and reactive to light.  Cardiovascular:     Rate and Rhythm: Normal rate.  Pulmonary:     Effort: Pulmonary effort is normal.  Skin:    General: Skin is warm and dry.  Neurological:     General: No focal deficit present.     Mental Status: She is alert and  oriented to person, place, and time.  Psychiatric:        Mood and Affect: Mood normal.        Behavior: Behavior normal.    Results for orders placed or performed during the hospital encounter of 05/24/21 (from the past 24 hour(s))  POCT Rapid Strep A     Status: None   Collection Time: 05/24/21  6:18 PM  Result Value Ref Range   Streptococcus, Group A Screen (Direct) NEGATIVE NEGATIVE    Assessment and Plan :   PDMP not reviewed this encounter.  1. Pharyngitis due to other organism   2. Sore throat     Will have patient start clindamycin 3 times daily for 10 days, strep culture pending.  Naproxen for pain and inflammation. Counseled patient on potential for adverse effects with medications prescribed/recommended today, ER and return-to-clinic precautions discussed, patient verbalized understanding.    Wallis Bamberg,  [place] 05/24/21 1951

## 2021-05-26 ENCOUNTER — Telehealth (HOSPITAL_COMMUNITY): Payer: Self-pay | Admitting: Urgent Care

## 2021-05-26 MED ORDER — AMOXICILLIN-POT CLAVULANATE 875-125 MG PO TABS: 1.0000 | ORAL_TABLET | Freq: Two times a day (BID) | ORAL | 0 refills | Status: DC

## 2021-05-26 NOTE — Telephone Encounter (Signed)
Called patient to check on her progress.  Unfortunately she is not much better with clindamycin.  She is already failed treatment with amoxicillin.  We decided to avoid another round of amoxicillin.  On exam she did not have signs of retropharyngeal abscess but given that she is not improving we agreed to switch to Augmentin.  Patient is to maintain strict ER precautions.  She will follow-up with Dr. Suszanne Conners, the [facility] ENT specialist on-call to the [facility].

## 2021-05-27 ENCOUNTER — Telehealth (HOSPITAL_COMMUNITY): Payer: Self-pay | Admitting: Emergency Medicine

## 2021-05-27 LAB — CULTURE, GROUP A STREP (THRC)

## 2021-05-27 NOTE — Telephone Encounter (Signed)
Patient called expressing concerns that she has not improved as much with her new antibiotic as she had anticipated, and wanted to see if [facility], APP recommended ER follow-up.  Joanne Wallace, APP not working today, reviewed with Joanne Wallace, APP who had me review ER precautions with patient.  Patient denies any worsening in symptoms, denies inability to swallow or breath at this time, but states Joanne Wallace was concerned for abscess.  This RN explained that the symptoms for ER precautions would be most indicative of that, but that the only way to diagnose is with a CT scan which could be done through her PCP or could be done in the ER emergently if deemed necessary based on symptoms.  Patient verbalized understanding and states she will monitor for another 24 hours.

## 2021-05-29 ENCOUNTER — Encounter: Payer: Self-pay | Admitting: Nurse Practitioner

## 2021-05-29 ENCOUNTER — Other Ambulatory Visit: Payer: Self-pay

## 2021-05-29 ENCOUNTER — Ambulatory Visit (INDEPENDENT_AMBULATORY_CARE_PROVIDER_SITE_OTHER): Payer: 59 | Admitting: Nurse Practitioner

## 2021-05-29 DIAGNOSIS — R0989 Other specified symptoms and signs involving the circulatory and respiratory systems: Secondary | ICD-10-CM

## 2021-05-29 DIAGNOSIS — J029 Acute pharyngitis, unspecified: Secondary | ICD-10-CM | POA: Diagnosis not present

## 2021-05-29 NOTE — Patient Instructions (Signed)
Sore Throat When you have a sore throat, your throat may feel: Tender. Burning. Irritated. Scratchy. Painful when you swallow. Painful when you talk. Many things can cause a sore throat, such as: An infection. Allergies. Dry air. Smoke or pollution. Radiation treatment. Gastroesophageal reflux disease (GERD). A tumor. A sore throat can be the first sign of another sickness. It can happen with other problems, like: Coughing. Sneezing. Fever. Swelling in the neck. Most sore throats go away without treatment. Follow these instructions at home:     Take over-the-counter medicines only as told by your doctor. If your child has a sore throat, do not give your child aspirin. Drink enough fluids to keep your pee (urine) pale yellow. Rest when you feel you need to. To help with pain: Sip warm liquids, such as broth, herbal tea, or warm water. Eat or drink cold or frozen liquids, such as frozen ice pops. Gargle with a salt-water mixture 3-4 times a day or as needed. To make a salt-water mixture, add -1 tsp (3-6 g) of salt to 1 cup (237 mL) of warm water. Mix it until you cannot see the salt anymore. Suck on hard candy or throat lozenges. Put a cool-mist humidifier in your bedroom at night. Sit in the bathroom with the door closed for 5-10 minutes while you run hot water in the shower. Do not use any products that contain nicotine or tobacco, such as cigarettes, e-cigarettes, and chewing tobacco. If you need help quitting, ask your doctor. Wash your hands well and often with soap and water. If soap and water are not available, use hand sanitizer. Contact a doctor if: You have a fever for more than 2-3 days. You keep having symptoms for more than 2-3 days. Your throat does not get better in 7 days. You have a fever and your symptoms suddenly get worse. Your child who is 3 months to 3 years old has a temperature of 102.2F (39C) or higher. Get help right away if: You have trouble  breathing. You cannot swallow fluids, soft foods, or your saliva. You have swelling in your throat or neck that gets worse. You keep feeling sick to your stomach (nauseous). You keep throwing up (vomiting). Summary A sore throat is pain, burning, irritation, or scratchiness in the throat. Many things can cause a sore throat. Take over-the-counter medicines only as told by your doctor. Do not give your child aspirin. Drink plenty of fluids, and rest as needed. Contact a doctor if your symptoms get worse or your sore throat does not get better within 7 days. This information is not intended to replace advice given to you by your health care provider. Make sure you discuss any questions you have with your healthcare provider. Document Revised: 04/19/2018 Document Reviewed: 04/19/2018 Elsevier Patient Education  2022 Elsevier Inc.  

## 2021-05-29 NOTE — Progress Notes (Signed)
Virtual Visit via sore throat   This visit type was conducted due to national recommendations for restrictions regarding the COVID-19 Pandemic (e.g. social distancing) in an effort to limit this patient's exposure and mitigate transmission in our community.  Due to her co-morbid illnesses, this patient is at least at moderate risk for complications without adequate follow up.  This format is felt to be most appropriate for this patient at this time.  All issues noted in this document were discussed and addressed.  A limited physical exam was performed with this format.    This visit type was conducted due to national recommendations for restrictions regarding the COVID-19 Pandemic (e.g. social distancing) in an effort to limit this patient's exposure and mitigate transmission in our community.  Patients identity confirmed using two different identifiers.  This format is felt to be most appropriate for this patient at this time.  All issues noted in this document were discussed and addressed.  No physical exam was performed (except for noted visual exam findings with Video Visits).    Date:  05/29/2021   ID:  Leanne Chang, DOB 1996-03-29, MRN [id number]  Patient Location:  Home   Provider location:   Office    Chief Complaint:  sore throat.   History of Present Illness:    Modesty Rudy is a 25 y.o. female who presents via video conferencing for a telehealth visit today.    The patient does have symptoms concerning for COVID-19 infection   6/13 sore throat and she did a E-visit and thought it was strep. Amoxacillin for 10 days. 6/22 on the 6/24 went to urgent care sore throat, strep was neg. Fever. Clindamycin and he switched her to Augmentin Sunday evening. She does have autoimmune disease. No fever. No SOB, No chest pain, she is coughing but not a lot .    Past Medical History:  Diagnosis Date   IBS (irritable bowel syndrome)    Past Surgical History:  Procedure Laterality  Date   HERNIA REPAIR     double   TONSILLECTOMY AND ADENOIDECTOMY       Current Meds  Medication Sig   hydroxychloroquine (PLAQUENIL) 200 MG tablet TAKE 1 TABLET BY MOUTH TWICE DAILY MONDAY THROUGH FRIDAY ONLY.   levocetirizine (XYZAL) 5 MG tablet Take 5 mg by mouth as needed.   Multiple Vitamin (MULTIVITAMIN WITH MINERALS) TABS tablet Take 1 tablet by mouth daily.   naproxen (NAPROSYN) 500 MG tablet Take 1 tablet (500 mg total) by mouth 2 (two) times daily with a meal.   TURMERIC PO Take by mouth daily.     Allergies:   Latex   Social History   Tobacco Use   Smoking status: Former    Packs/day: 0.25    Years: 7.00    Pack years: 1.75    Types: Cigarettes    Quit date: 2019    Years since quitting: 3.4   Smokeless tobacco: Never  Vaping Use   Vaping Use: Never used  Substance Use Topics   Alcohol use: Yes    Comment: occ   Drug use: Yes    Types: Marijuana    Comment: daily     Family Hx: The patient's family history includes Arthritis in her maternal grandmother; Breast cancer in her mother; Healthy in her brother; Hypothyroidism in her father and mother; Osteoarthritis in her paternal aunt; Rheum arthritis in her paternal aunt.  ROS:   Please see the history of present illness.    Review of  Systems  Constitutional:  Positive for malaise/fatigue. Negative for fever.  HENT:  Positive for congestion and sore throat.   Respiratory:  Negative for wheezing.   Cardiovascular:  Negative for chest pain.  Neurological:  Negative for dizziness and headaches.   All other systems reviewed and are negative.   Labs/Other Tests and Data Reviewed:    Recent Labs: 02/06/2021: ALT 11; BUN 7; Creat 0.61; Hemoglobin 15.1; Platelets 296; Potassium 4.2; Sodium 140   Recent Lipid Panel Lab Results  Component Value Date/Time   CHOL 146 10/16/2020 03:54 PM   TRIG 97 10/16/2020 03:54 PM   HDL 55 10/16/2020 03:54 PM   CHOLHDL 2.7 10/16/2020 03:54 PM   LDLCALC 73 10/16/2020 03:54  PM    Wt Readings from Last 3 Encounters:  02/06/21 166 lb 6.4 oz (75.5 kg)  11/07/20 163 lb (73.9 kg)  10/16/20 162 lb 3.2 oz (73.6 kg)     Exam:    Vital Signs:  There were no vitals taken for this visit.    Physical Exam Vitals and nursing note reviewed.  Constitutional:      Appearance: She is well-developed.  HENT:     Head: Normocephalic and atraumatic.  Pulmonary:     Effort: Pulmonary effort is normal.  Neurological:     Mental Status: She is alert and oriented to person, place, and time.  Psychiatric:        Mood and Affect: Affect normal.    ASSESSMENT & PLAN:     1. Sore throat -Patient went to the urgent care and was tested for strep throat and was tested negative.  -Advised patient to come in for a repeat strep test and a COVID/Flu test -Continue to take OTC pain relievers as needed  -Hydrate with water and rest  -Take Vit C , D and zinc for symptom relief.  -The ED doctor had suggested the patient to get the throat looked at further by the ENT specialist. So will send a referral for ENT.  - Ambulatory referral to ENT  2. Chest congestion -Patient is currently taking Augmentin BID x 10 days -Advised patient to come for the COVID and Flu test  -Continue taking OTC meds as needed.  -Hydrate with water and rest  -Take Vit C,D and Zinc for symptom relief.  -Take Mucinex as needed.   Follow up: if symptoms persist or do not get better.   The patient was encouraged to call or send a message through MyChart for any questions or concerns.   Side effects and appropriate use of all the medication(s) were discussed with the patient today. Patient advised to use the medication(s) as directed by their healthcare provider. The patient was encouraged to read, review, and understand all associated package inserts and contact our office with any questions or concerns. The patient accepts the risks of the treatment plan and had an opportunity to ask questions.    Patient was given opportunity to ask questions. Patient verbalized understanding of the plan and was able to repeat key elements of the plan. All questions were answered to their satisfaction.  Raman Martha Ellerby, DNP   I, Raman Kortne All have reviewed all documentation for this visit. The documentation on 05/29/21 for the exam, diagnosis, procedures, and orders are all accurate and complete.    COVID-19 Education: The signs and symptoms of COVID-19 were discussed with the patient and how to seek care for testing (follow up with PCP or arrange E-visit).  The importance of social distancing was  discussed today.  Patient Risk:   After full review of this patients clinical status, I feel that they are at least moderate risk at this time.  Time:   Today, I have spent 15 minutes/ seconds with the patient with telehealth technology discussing above diagnoses.     Medication Adjustments/Labs and Tests Ordered: Current medicines are reviewed at length with the patient today.  Concerns regarding medicines are outlined above.   Tests Ordered: Orders Placed This Encounter  Procedures   Ambulatory referral to ENT    Medication Changes: No orders of the defined types were placed in this encounter.   Disposition:  Follow up prn  Signed, Charlesetta Ivory, NP

## 2021-06-06 ENCOUNTER — Ambulatory Visit (HOSPITAL_COMMUNITY)
Admission: EM | Admit: 2021-06-06 | Discharge: 2021-06-06 | Disposition: A | Discharge status: Home / Self Care | Payer: 59 | Attending: Emergency Medicine | Admitting: Emergency Medicine

## 2021-06-06 ENCOUNTER — Other Ambulatory Visit: Payer: Self-pay

## 2021-06-06 ENCOUNTER — Encounter (HOSPITAL_COMMUNITY): Payer: Self-pay

## 2021-06-06 DIAGNOSIS — J029 Acute pharyngitis, unspecified: Secondary | ICD-10-CM | POA: Diagnosis not present

## 2021-06-06 MED ORDER — GUAIFENESIN-DM 100-10 MG/5ML PO SYRP: 5.0000 mL | ORAL_SOLUTION | ORAL | 0 refills | Status: DC | PRN

## 2021-06-06 MED ORDER — GUAIFENESIN ER 600 MG PO TB12: 600.0000 mg | ORAL_TABLET | Freq: Two times a day (BID) | ORAL | 0 refills | Status: DC

## 2021-06-06 MED ORDER — AMOXICILLIN-POT CLAVULANATE 875-125 MG PO TABS: 1.0000 | ORAL_TABLET | Freq: Two times a day (BID) | ORAL | 0 refills | Status: DC

## 2021-06-06 NOTE — ED Triage Notes (Signed)
Pt present severe throat discomfort. Pt has completed the antibiotics she was prescribed and state that she still having some difficulty swallowing.

## 2021-06-06 NOTE — ED Provider Notes (Signed)
[facility]    CSN: [id number] Arrival date & time: 06/06/21  1427      History   Chief Complaint Chief Complaint  Patient presents with  . Sore Throat    HPI Joanne Wallace is a 25 y.o. female.   Patient presents with feeling of throat not feeling right when swallowing. Denies pain but does not feel back at baseline. Has nasal congestion with post nasal drip, productive cough and left ear fullness as well. Denies fever, chills, body aches, shortness of breath, headaches, chest pain, N/V/D. Has had an intermittent sore throat for 19 days. Failed course of amoxicillin. Completed course of Augmentin yesterday morning. Recent Covid test negative. Unable to get appointment with ENT until August.   Past Medical History:  Diagnosis Date  . IBS (irritable bowel syndrome)     Patient Active Problem List   Diagnosis Date Noted  . Autoimmune disease (HCC) 09/26/2020  . IBS (irritable bowel syndrome) 05/07/2019  . Menstrual changes 05/07/2019  . Fatigue 05/07/2019    Past Surgical History:  Procedure Laterality Date  . HERNIA REPAIR     double  . TONSILLECTOMY AND ADENOIDECTOMY      OB History   No obstetric history on file.      Home Medications    Prior to Admission medications   Medication Sig Start Date End Date Taking? Authorizing Provider  amoxicillin-clavulanate (AUGMENTIN) 875-125 MG tablet Take 1 tablet by mouth every 12 (twelve) hours. 06/06/21  Yes Markevius Trombetta R, NP  guaiFENesin (MUCINEX) 600 MG 12 hr tablet Take 1 tablet (600 mg total) by mouth 2 (two) times daily. 06/06/21  Yes Eulice Rutledge R, NP  guaiFENesin-dextromethorphan (ROBITUSSIN DM) 100-10 MG/5ML syrup Take 5 mLs by mouth every 4 (four) hours as needed for cough. 06/06/21  Yes Kaja Jackowski R, NP  hydroxychloroquine (PLAQUENIL) 200 MG tablet TAKE 1 TABLET BY MOUTH TWICE DAILY MONDAY THROUGH FRIDAY ONLY. 04/03/21   Gearldine Bienenstock, PA-C  levocetirizine (XYZAL) 5 MG tablet Take 5 mg by mouth  as needed.    [provider]  Multiple Vitamin (MULTIVITAMIN WITH MINERALS) TABS tablet Take 1 tablet by mouth daily.    [provider]  naproxen (NAPROSYN) 500 MG tablet Take 1 tablet (500 mg total) by mouth 2 (two) times daily with a meal. 05/24/21   Wallis Bamberg, PA-C  TURMERIC PO Take by mouth daily.    [provider]    Family History Family History  Problem Relation Age of Onset  . Breast cancer Mother   . Hypothyroidism Mother   . Hypothyroidism Father   . Healthy Brother   . Rheum arthritis Paternal Aunt   . Arthritis Maternal Grandmother   . Osteoarthritis Paternal Aunt     Social History Social History   Tobacco Use  . Smoking status: Former    Packs/day: 0.25    Years: 7.00    Pack years: 1.75    Types: Cigarettes    Quit date: 2019    Years since quitting: 3.5  . Smokeless tobacco: Never  Vaping Use  . Vaping Use: Never used  Substance Use Topics  . Alcohol use: Yes    Comment: occ  . Drug use: Yes    Types: Marijuana    Comment: daily     Allergies   Latex   Review of Systems Review of Systems Defer to HPI    Physical Exam Triage Vital Signs ED Triage Vitals  Enc Vitals Group  BP 06/06/21 1450 125/61     Pulse Rate 06/06/21 1450 93     Resp 06/06/21 1450 18     Temp 06/06/21 1450 99.1 F (37.3 C)     Temp Source 06/06/21 1450 Oral     SpO2 06/06/21 1450 100 %     Weight --      Height --      Head Circumference --      Peak Flow --      Pain Score 06/06/21 1451 0     Pain Loc --      Pain Edu? --      Excl. in GC? --    No data found.  Updated Vital Signs BP 125/61 (BP Location: Right Arm)   Pulse 93   Temp 99.1 F (37.3 C) (Oral)   Resp 18   SpO2 100%   Visual Acuity Right Eye Distance:   Left Eye Distance:   Bilateral Distance:    Right Eye Near:   Left Eye Near:    Bilateral Near:     Physical Exam Constitutional:      Appearance: She is well-developed and normal weight.  HENT:      Head: Normocephalic.     Right Ear: Tympanic membrane and ear canal normal.     Left Ear: Tympanic membrane and ear canal normal.     Nose: Congestion present. No rhinorrhea.     Mouth/Throat:     Mouth: Mucous membranes are moist.     Pharynx: Posterior oropharyngeal erythema present.     Comments: Tonsils not present  Eyes:     Conjunctiva/sclera: Conjunctivae normal.     Pupils: Pupils are equal, round, and reactive to light.  Cardiovascular:     Rate and Rhythm: Normal rate and regular rhythm.     Heart sounds: Normal heart sounds.  Musculoskeletal:     Cervical back: Normal range of motion and neck supple.  Skin:    General: Skin is warm and dry.  Neurological:     General: No focal deficit present.     Mental Status: She is alert and oriented to person, place, and time.  Psychiatric:        Mood and Affect: Mood normal.        Behavior: Behavior normal.     UC Treatments / Results  Labs (all labs ordered are listed, but only abnormal results are displayed) Labs Reviewed - No data to display  EKG   Radiology No results found.  Procedures Procedures (including critical care time)  Medications Ordered in UC Medications - No data to display  Initial Impression / Assessment and Plan / UC Course  I have reviewed the triage vital signs and the nursing notes.  Pertinent labs & imaging results that were available during my care of the patient were reviewed by me and considered in my medical decision making (see chart for details).  Sore throat  Mucinex 600 mg bid  Robitussin DM 100-10 5 mL every 4 hrs prn Augmentin 875/125 bid for 7 days prn if sore throat worsens, going out of town tomorrow.  Declining retest of strep and covid  Final Clinical Impressions(s) / UC Diagnoses   Final diagnoses:  Sore throat     Discharge Instructions      Use mucinex twice a day for congestions  Use robitussin DM 5 mLs every 4 hours for congestion and cough  Only  use antibiotic if sore throat becomes painful  ED Prescriptions     Medication Sig Dispense Auth. Provider   guaiFENesin (MUCINEX) 600 MG 12 hr tablet Take 1 tablet (600 mg total) by mouth 2 (two) times daily. 30 tablet Ilianna Bown R, NP   guaiFENesin-dextromethorphan (ROBITUSSIN DM) 100-10 MG/5ML syrup Take 5 mLs by mouth every 4 (four) hours as needed for cough. 118 mL Aneri Slagel R, NP   amoxicillin-clavulanate (AUGMENTIN) 875-125 MG tablet Take 1 tablet by mouth every 12 (twelve) hours. 14 tablet Wileen Duncanson, Elita Boone, NP      PDMP not reviewed this encounter.   Valinda Hoar, NP 06/06/21 1606

## 2021-06-06 NOTE — Discharge Instructions (Addendum)
Use mucinex twice a day for congestions  Use robitussin DM 5 mLs every 4 hours for congestion and cough  Only use antibiotic if sore throat becomes painful

## 2021-06-10 ENCOUNTER — Ambulatory Visit (INDEPENDENT_AMBULATORY_CARE_PROVIDER_SITE_OTHER): Payer: 59 | Admitting: Nurse Practitioner

## 2021-06-10 ENCOUNTER — Encounter: Payer: Self-pay | Admitting: Nurse Practitioner

## 2021-06-10 ENCOUNTER — Other Ambulatory Visit: Payer: Self-pay

## 2021-06-10 VITALS — BP 116/78 | HR 71 | Temp 98.5°F | Ht 65.6 in | Wt 160.0 lb

## 2021-06-10 DIAGNOSIS — J029 Acute pharyngitis, unspecified: Secondary | ICD-10-CM | POA: Diagnosis not present

## 2021-06-10 DIAGNOSIS — R0989 Other specified symptoms and signs involving the circulatory and respiratory systems: Secondary | ICD-10-CM

## 2021-06-10 NOTE — Progress Notes (Signed)
I,Yamilka [employer] as a Education administrator for [employer], FNP.,have documented all relevant documentation on the behalf of Minette Brine, FNP,as directed by  Minette Brine, FNP while in the presence of Minette Brine, [place].  This visit occurred during the SARS-CoV-2 public health emergency.  Safety protocols were in place, including screening questions prior to the visit, additional usage of staff PPE, and extensive cleaning of exam room while observing appropriate contact time as indicated for disinfecting solutions.  Subjective:     Patient ID: Joanne Wallace , female    DOB: 1996-11-16 , 25 y.o.   MRN: [id number]   Chief Complaint  Patient presents with   Sore Throat    HPI  Patient presents today for a f/u on her sore throat. She has tried amoxicillin and augmentin and did not feel any better with either of them.  She has had adenectomy and tonsillectomy.   Sore Throat  This is a recurrent problem. The current episode started more than 1 year ago. The problem has been gradually worsening. There has been no fever. The patient is experiencing no pain. Associated symptoms include swollen glands. Pertinent negatives include no abdominal pain, congestion, coughing, headaches, neck pain, shortness of breath or vomiting. Associated symptoms comments: Post nasal drainage. She has had no exposure to strep (negative) or mono. She has tried gargles (oral antibiotics) for the symptoms.    Past Medical History:  Diagnosis Date   IBS (irritable bowel syndrome)      Family History  Problem Relation Age of Onset   Breast cancer Mother    Hypothyroidism Mother    Hypothyroidism Father    Healthy Brother    Rheum arthritis Paternal Aunt    Arthritis Maternal Grandmother    Osteoarthritis Paternal Aunt      Current Outpatient Medications:    amoxicillin-clavulanate (AUGMENTIN) 875-125 MG tablet, Take 1 tablet by mouth every 12 (twelve) hours., Disp: 14 tablet, Rfl: 0   guaiFENesin (MUCINEX) 600  MG 12 hr tablet, Take 1 tablet (600 mg total) by mouth 2 (two) times daily., Disp: 30 tablet, Rfl: 0   guaiFENesin-dextromethorphan (ROBITUSSIN DM) 100-10 MG/5ML syrup, Take 5 mLs by mouth every 4 (four) hours as needed for cough., Disp: 118 mL, Rfl: 0   hydroxychloroquine (PLAQUENIL) 200 MG tablet, TAKE 1 TABLET BY MOUTH TWICE DAILY MONDAY THROUGH FRIDAY ONLY., Disp: 40 tablet, Rfl: 2   Multiple Vitamin (MULTIVITAMIN WITH MINERALS) TABS tablet, Take 1 tablet by mouth daily., Disp: , Rfl:    TURMERIC PO, Take by mouth daily., Disp: , Rfl:    levocetirizine (XYZAL) 5 MG tablet, Take 5 mg by mouth as needed. (Patient not taking: Reported on 06/10/2021), Disp: , Rfl:    Allergies  Allergen Reactions   Latex Itching     Review of Systems  Constitutional: Negative.   HENT:  Negative for congestion.   Respiratory: Negative.  Negative for cough and shortness of breath.   Cardiovascular: Negative.  Negative for chest pain, palpitations and leg swelling.  Gastrointestinal:  Negative for abdominal pain and vomiting.  Musculoskeletal:  Negative for neck pain.  Neurological:  Negative for dizziness and headaches.  Psychiatric/Behavioral: Negative.      Today's Vitals   06/10/21 1025  BP: 116/78  Pulse: 71  Temp: 98.5 F (36.9 C)  Weight: 160 lb (72.6 kg)  Height: 5' 5.6" (1.666 m)  PainSc: 3   PainLoc: Throat   Body mass index is 26.14 kg/m.   Objective:  Physical Exam Vitals reviewed.  Constitutional:      General: She is not in acute distress.    Appearance: She is well-developed.  HENT:     Right Ear: No tenderness.     Left Ear: No tenderness.     Nose: No congestion.     Mouth/Throat:     Mouth: Mucous membranes are moist.     Pharynx: Posterior oropharyngeal erythema present. No pharyngeal swelling.  Cardiovascular:     Rate and Rhythm: Normal rate and regular rhythm.     Heart sounds: Normal heart sounds. No murmur heard. Musculoskeletal:     Cervical back: Normal range  of motion and neck supple.  Skin:    General: Skin is warm.     Capillary Refill: Capillary refill takes less than 2 seconds.  Neurological:     General: No focal deficit present.     Mental Status: She is alert and oriented to person, place, and time.  Psychiatric:        Mood and Affect: Mood normal.        Behavior: Behavior normal.        Assessment And Plan:     1. Sore throat Has persistent sore throat will check epstein barr and check CT scan of neck as  she waits for her referral to ENT - EPSTEIN-BARR VIRUS (EBV) Antibody Profile - CT Soft Tissue Neck W Contrast; Future - BMP8+eGFR - CBC - HSV(herpes simplex vrs) 1+2 ab-IgG  2. Chest congestion  No abnormal findings on physical exam Encouraged to take mucinex as needed  Patient was given opportunity to ask questions. Patient verbalized understanding of the plan and was able to repeat key elements of the plan. All questions were answered to their satisfaction.  Minette Brine, FNP    I, Minette Brine, FNP, have reviewed all documentation for this visit. The documentation on 06/10/21 for the exam, diagnosis, procedures, and orders are all accurate and complete.   IF YOU HAVE BEEN REFERRED TO A SPECIALIST, IT MAY TAKE 1-2 WEEKS TO SCHEDULE/PROCESS THE REFERRAL. IF YOU HAVE NOT HEARD FROM US/SPECIALIST IN TWO WEEKS, PLEASE GIVE [place] A CALL AT [phone].   THE PATIENT IS ENCOURAGED TO PRACTICE SOCIAL DISTANCING DUE TO THE COVID-19 PANDEMIC.

## 2021-06-11 LAB — EPSTEIN-BARR VIRUS (EBV) ANTIBODY PROFILE
EBV NA IgG: >600 U/mL — ABNORMAL HIGH (ref 0.0–17.9)
EBV VCA IgG: >600 U/mL — ABNORMAL HIGH (ref 0.0–17.9)
EBV VCA IgM: <36 U/mL (ref 0.0–35.9)

## 2021-06-11 LAB — BMP8+EGFR
BUN/Creatinine Ratio: 11 (ref 9–23)
BUN: 6 mg/dL (ref 6–20)
CO2: 16 mmol/L — ABNORMAL LOW (ref 20–29)
Calcium: 9.1 mg/dL (ref 8.7–10.2)
Chloride: 107 mmol/L — ABNORMAL HIGH (ref 96–106)
Creatinine, Ser: 0.57 mg/dL (ref 0.57–1.00)
Glucose: 91 mg/dL (ref 65–99)
Potassium: 4.1 mmol/L (ref 3.5–5.2)
Sodium: 140 mmol/L (ref 134–144)
eGFR: 128 mL/min/{1.73_m2} (ref >59)

## 2021-06-11 LAB — CBC
Hematocrit: 40.9 % (ref 34.0–46.6)
Hemoglobin: 13.9 g/dL (ref 11.1–15.9)
MCH: 30.3 pg (ref 26.6–33.0)
MCHC: 34 g/dL (ref 31.5–35.7)
MCV: 89 fL (ref 79–97)
Platelets: 274 10*3/uL (ref 150–450)
RBC: 4.59 x10E6/uL (ref 3.77–5.28)
RDW: 12.5 % (ref 11.7–15.4)
WBC: 5.2 10*3/uL (ref 3.4–10.8)

## 2021-06-11 LAB — HSV(HERPES SIMPLEX VRS) I + II AB-IGG
HSV 1 Glycoprotein G Ab, IgG: <0.91 index (ref 0.00–0.90)
HSV 2 IgG, Type Spec: <0.91 index (ref 0.00–0.90)

## 2021-06-24 ENCOUNTER — Encounter: Payer: Self-pay | Admitting: Nurse Practitioner

## 2021-06-25 ENCOUNTER — Other Ambulatory Visit: Payer: 59

## 2021-06-25 ENCOUNTER — Other Ambulatory Visit: Payer: Self-pay

## 2021-06-25 ENCOUNTER — Ambulatory Visit
Admission: RE | Admit: 2021-06-25 | Discharge: 2021-06-25 | Disposition: A | Discharge status: Home / Self Care | Payer: 59 | Source: Ambulatory Visit | Attending: Nurse Practitioner | Admitting: Nurse Practitioner

## 2021-06-25 DIAGNOSIS — J029 Acute pharyngitis, unspecified: Secondary | ICD-10-CM

## 2021-06-25 MED ORDER — IOPAMIDOL (ISOVUE-300) INJECTION 61%
75.0000 mL | Freq: Once | INTRAVENOUS | Status: AC | PRN
  Administered 2021-06-25: 75 mL via INTRAVENOUS

## 2021-06-26 ENCOUNTER — Telehealth: Payer: Self-pay

## 2021-06-26 NOTE — Progress Notes (Signed)
Office Visit Note  Patient: Joanne Wallace             Date of Birth: November 11, 1996           MRN: [id number]             PCP: [name], FNP Referring: [name], FNP Visit Date: 07/10/2021 Occupation: @GUAROCC @  Subjective:  Pain in hands and feet.   History of Present Illness: Joanne Wallace is a 25 y.o. female with a history of autoimmune disease.  She states she has not been taking Plaquenil on a regular basis but she recently started taking it on a regular basis due to her busy schedule.  She is on her feet most of the day.  She states she has been having discomfort in her left great toe.  She has not noticed any swelling.  She has been wearing proper fitting shoes.  She states she has been having recurrent sore throat for the last few months and was seen at the urgent care recently where her EBV titers were positive.  She was also found to have some cervical lymph nodes.  She had a CT scan of her neck which showed cervical adenopathy.  She has appointment coming up with ENT surgeon.  She denies any history of oral ulcers, nasal ulcers, sicca symptoms, Raynaud's, malar rash, photosensitivity or inflammatory arthritis.  Activities of Daily Living:  Patient reports morning stiffness for  1-2 hours.   Patient Denies nocturnal pain.  Difficulty dressing/grooming: Denies Difficulty climbing stairs: Denies Difficulty getting out of chair: Denies Difficulty using hands for taps, buttons, cutlery, and/or writing: Reports  Review of Systems  Constitutional:  Positive for fatigue.  HENT:  Negative for mouth sores, mouth dryness and nose dryness.   Eyes:  Negative for pain, itching and dryness.  Respiratory:  Negative for shortness of breath and difficulty breathing.   Cardiovascular:  Negative for chest pain and palpitations.  Gastrointestinal:  Negative for blood in stool, constipation and diarrhea.  Endocrine: Negative for increased urination.  Genitourinary:  Negative for  difficulty urinating.  Musculoskeletal:  Positive for joint pain, joint pain and morning stiffness. Negative for joint swelling, myalgias, muscle tenderness and myalgias.  Skin:  Negative for color change, rash, redness and sensitivity to sunlight.  Allergic/Immunologic: Positive for susceptible to infections.  Neurological:  Positive for memory loss and weakness. Negative for dizziness, numbness and headaches.  Hematological:  Positive for bruising/bleeding tendency and swollen glands.  Psychiatric/Behavioral:  Negative for depressed mood, confusion and sleep disturbance. The patient is not nervous/anxious.    PMFS History:  Patient Active Problem List   Diagnosis Date Noted   Autoimmune disease (HCC) 09/26/2020   IBS (irritable bowel syndrome) 05/07/2019   Menstrual changes 05/07/2019   Fatigue 05/07/2019    Past Medical History:  Diagnosis Date   IBS (irritable bowel syndrome)     Family History  Problem Relation Age of Onset   Breast cancer Mother    Hypothyroidism Mother    Hypothyroidism Father    Healthy Brother    Rheum arthritis Paternal Aunt    Arthritis Maternal Grandmother    Osteoarthritis Paternal Aunt    Past Surgical History:  Procedure Laterality Date   HERNIA REPAIR     double   TONSILLECTOMY AND ADENOIDECTOMY     Social History   Social History Narrative   Not on file   Immunization History  Administered Date(s) Administered   Influenza,inj,Quad PF,6+ Mos 10/16/2020   Moderna Sars-Covid-2  Vaccination 02/09/2020, 03/08/2020, 11/01/2020   Tdap 10/16/2020     Objective: Vital Signs: BP 114/71 (BP Location: Left Arm, Patient Position: Sitting, Cuff Size: Normal)   Pulse 67   Ht 5\' 5"  (1.651 m)   Wt 163 lb (73.9 kg)   BMI 27.12 kg/m    Physical Exam Vitals and nursing note reviewed.  Constitutional:      Appearance: She is well-developed.  HENT:     Head: Normocephalic and atraumatic.  Eyes:     Conjunctiva/sclera: Conjunctivae normal.   Cardiovascular:     Rate and Rhythm: Normal rate and regular rhythm.     Heart sounds: Normal heart sounds.  Pulmonary:     Effort: Pulmonary effort is normal.     Breath sounds: Normal breath sounds.  Abdominal:     General: Bowel sounds are normal.     Palpations: Abdomen is soft.  Musculoskeletal:     Cervical back: Normal range of motion.  Lymphadenopathy:     Cervical: No cervical adenopathy.  Skin:    General: Skin is warm and dry.     Capillary Refill: Capillary refill takes less than 2 seconds.  Neurological:     Mental Status: She is alert and oriented to person, place, and time.  Psychiatric:        Behavior: Behavior normal.     Musculoskeletal Exam: C-spine, thoracic and lumbar spine were in good range of motion.  She had no SI joint tenderness.  Shoulder joints, elbow joints, wrist joints, MCPs PIPs and DIPs with good range of motion.  She had left first PIP thickening.  Hip joints and knee joints in good range of motion.  She had some thickening of the left great toe PIP joint with no synovitis.  CDAI Exam: CDAI Score: -- Patient Global: --; Provider Global: -- Swollen: --; Tender: -- Joint Exam 07/10/2021   No joint exam has been documented for this visit   There is currently no information documented on the homunculus. Go to the Rheumatology activity and complete the homunculus joint exam.  Investigation: No additional findings.  Imaging: CT Soft Tissue Neck W Contrast  Result Date: 06/26/2021 CLINICAL DATA:  Sore throat J02.9 (ICD-10-CM). Lymphadenopathy, neck. Additional history provided by scanning technologist: Patient reports lymphadenopathy, neck, sore throat, fever, chills since May 13, 2021, history of tonsil and adenoid surgery. EXAM: CT NECK WITH CONTRAST TECHNIQUE: Multidetector CT imaging of the neck was performed using the standard protocol following the bolus administration of intravenous contrast. CONTRAST:  96mL ISOVUE-300 IOPAMIDOL  (ISOVUE-300) INJECTION 61% COMPARISON:  No pertinent prior exams available for comparison. FINDINGS: Mildly motion degraded exam. Pharynx and larynx: Nonspecific prominence of the nasopharyngeal/adenoid soft tissues and lingual tonsils. No appreciable swelling or discrete mass identified elsewhere within the oral cavity, pharynx or larynx. Salivary glands: No inflammation, mass, or stone. Thyroid: Unremarkable. Lymph nodes: Mildly enlarged right level 2/3 lymph node measuring 12 mm in short axis (series 13, image 25). Lymph nodes are prominent in size and number elsewhere within the bilateral neck, although subcentimeter in short axis. Vascular: The major vascular structures of the neck are patent. Limited intracranial: No evidence of acute intracranial abnormality within the field of view. Visualized orbits: Incompletely imaged. No mass or acute finding at the imaged levels. Mastoids and visualized paranasal sinuses: No significant paranasal sinus disease or mastoid effusion at the imaged levels. Skeleton: No acute bony abnormality or aggressive osseous lesion. 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 Trace C2-C3 grade 1 anterolisthesis.  Upper chest: No consolidation within the imaged lung apices. These results will be called to the ordering clinician or representative by the Radiologist Assistant, and communication documented in the PACS or [employer]. IMPRESSION: Prominence of the nasopharyngeal/adenoid soft tissues and lingual tonsils. Additionally, there is a mildly enlarged right level 2/3 lymph node measuring 12 mm in short axis. Elsewhere within the neck, lymph nodes are prominent in size and number, but measure subcentimeter in short axis. This constellation of findings is nonspecific, and could reflect an infectious/reactive etiology. However, a lymphoproliferative process such as lymphoma cannot be excluded. Clinical correlation recommended. Consider ENT consultation and direct tissue  sampling, as appropriate. Incidentally noted congenital non-segmentation of the C6-C7 vertebrae. Electronically Signed   By: Jackey Loge DO   On: 06/26/2021 09:09    Recent Labs: Lab Results  Component Value Date   WBC 5.2 06/10/2021   HGB 13.9 06/10/2021   PLT 274 06/10/2021   NA 140 06/10/2021   K 4.1 06/10/2021   CL 107 (H) 06/10/2021   CO2 16 (L) 06/10/2021   GLUCOSE 91 06/10/2021   BUN 6 06/10/2021   CREATININE 0.57 06/10/2021   BILITOT 0.6 02/06/2021   ALKPHOS 55 06/14/2020   AST 16 02/06/2021   ALT 11 02/06/2021   PROT 7.7 02/06/2021   ALBUMIN 4.6 06/14/2020   CALCIUM 9.1 06/10/2021   GFRAA 144 02/06/2021    Speciality Comments: PLQ EYE EXAM 01/01/2021 normal [facility] 1 year  Procedures:  No procedures performed Allergies: Latex   Assessment / Plan:     Visit Diagnoses: Autoimmune disease (HCC) - Positive ANA, positive RNP, positive Ro, arthritis: She gives history of fatigue which she relates to her work.  She also is having some joint discomfort.  She states she has not been taking Plaquenil on a regular basis due to hectic schedule.  She recently started taking Plaquenil on a regular basis.  She denies any history of Raynauds symptoms, malar rash, photosensitivity.  She was recently diagnosed with some cervical lymph nodes and had CT scan.  She has an appointment coming up with ENT.  She had recent CBC and CMP which was normal.  I will obtain AVISE labs prior to her next visit.  High risk medication use - Plaquenil 200 mg twice daily Monday to Friday. PLQ EYE EXAM 01/01/2021  Cervical lymphadenopathy-she had recent sore throat and developed cervical lymphadenopathy.  She has appointment coming up with ENT.  Pain in both hands - History of intermittent swelling.  X-rays were unremarkable.  She had thickening of her left first PIP joint most likely due to overuse.  No synovitis was noted.  She is left-handed.  A handout on hand exercises was given.  Pain  in both feet - History of intermittent swelling.  X-rays were unremarkable.  No synovitis was noted.  She has thickening of her left great toe PIP joint.  Proper fitting shoes were discussed.  Other fatigue-most  likely related to her hectic work schedule.  Other acne-she was advised over-the-counter products.  She plans to see dermatologist.  History of IBS  Anxiety and depression  Seasonal allergies  Family history of gout - Father.   Family history of rheumatoid arthritis - Paternal aunt  Orders: No orders of the defined types were placed in this encounter.  No orders of the defined types were placed in this encounter.    Follow-Up Instructions: Return in about 3 months (around 10/10/2021) for Autoimmune disease.   Pollyann Savoy, MD  Note - This record has been created using [employer].  Chart creation errors have been sought, but may not always  have been located. Such creation errors do not reflect on  the standard of medical care.

## 2021-06-26 NOTE — Telephone Encounter (Signed)
[facility] called to let her know patient's CT results are in. [phone]  I returned the call and advised them we have received them. YL,RMA

## 2021-07-10 ENCOUNTER — Other Ambulatory Visit: Payer: Self-pay

## 2021-07-10 ENCOUNTER — Ambulatory Visit: Payer: 59 | Admitting: Rheumatology

## 2021-07-10 ENCOUNTER — Encounter: Payer: Self-pay | Admitting: Rheumatology

## 2021-07-10 VITALS — BP 114/71 | HR 67 | Ht 65.0 in | Wt 163.0 lb

## 2021-07-10 DIAGNOSIS — F32A Depression, unspecified: Secondary | ICD-10-CM

## 2021-07-10 DIAGNOSIS — M79671 Pain in right foot: Secondary | ICD-10-CM

## 2021-07-10 DIAGNOSIS — R5383 Other fatigue: Secondary | ICD-10-CM

## 2021-07-10 DIAGNOSIS — F419 Anxiety disorder, unspecified: Secondary | ICD-10-CM

## 2021-07-10 DIAGNOSIS — Z79899 Other long term (current) drug therapy: Secondary | ICD-10-CM | POA: Diagnosis not present

## 2021-07-10 DIAGNOSIS — M79672 Pain in left foot: Secondary | ICD-10-CM

## 2021-07-10 DIAGNOSIS — R590 Localized enlarged lymph nodes: Secondary | ICD-10-CM

## 2021-07-10 DIAGNOSIS — Z8719 Personal history of other diseases of the digestive system: Secondary | ICD-10-CM

## 2021-07-10 DIAGNOSIS — M359 Systemic involvement of connective tissue, unspecified: Secondary | ICD-10-CM | POA: Diagnosis not present

## 2021-07-10 DIAGNOSIS — L708 Other acne: Secondary | ICD-10-CM

## 2021-07-10 DIAGNOSIS — Z8269 Family history of other diseases of the musculoskeletal system and connective tissue: Secondary | ICD-10-CM

## 2021-07-10 DIAGNOSIS — Z8261 Family history of arthritis: Secondary | ICD-10-CM

## 2021-07-10 DIAGNOSIS — M79641 Pain in right hand: Secondary | ICD-10-CM

## 2021-07-10 DIAGNOSIS — M79642 Pain in left hand: Secondary | ICD-10-CM

## 2021-07-10 DIAGNOSIS — J302 Other seasonal allergic rhinitis: Secondary | ICD-10-CM

## 2021-07-10 NOTE — Patient Instructions (Signed)
Hand Exercises Hand exercises can be helpful for almost anyone. These exercises can strengthen the hands, improve flexibility and movement, and increase blood flow to the hands. These results can make work and daily tasks easier. Hand exercises can be especially helpful for people who have joint pain from arthritis or have nerve damage from overuse (carpal tunnel syndrome). These exercises can also help people who have injured a hand. Exercises Most of these hand exercises are gentle stretching and motion exercises. It is usually safe to do them often throughout the day. Warming up your hands before exercise may help to reduce stiffness. You can do this with gentle massage orby placing your hands in warm water for 10-15 minutes. It is normal to feel some stretching, pulling, tightness, or mild discomfort as you begin new exercises. This will gradually improve. Stop an exercise right away if you feel sudden, severe pain or your pain gets worse. Ask your healthcare provider which exercises are best for you. Knuckle bend or "claw" fist Stand or sit with your arm, hand, and all five fingers pointed straight up. Make sure to keep your wrist straight during the exercise. Gently bend your fingers down toward your palm until the tips of your fingers are touching the top of your palm. Keep your big knuckle straight and just bend the small knuckles in your fingers. Hold this position for __________ seconds. Straighten (extend) your fingers back to the starting position. Repeat this exercise 5-10 times with each hand. Full finger fist Stand or sit with your arm, hand, and all five fingers pointed straight up. Make sure to keep your wrist straight during the exercise. Gently bend your fingers into your palm until the tips of your fingers are touching the middle of your palm. Hold this position for __________ seconds. Extend your fingers back to the starting position, stretching every joint fully. Repeat this  exercise 5-10 times with each hand. Straight fist Stand or sit with your arm, hand, and all five fingers pointed straight up. Make sure to keep your wrist straight during the exercise. Gently bend your fingers at the big knuckle, where your fingers meet your hand, and the middle knuckle. Keep the knuckle at the tips of your fingers straight and try to touch the bottom of your palm. Hold this position for __________ seconds. Extend your fingers back to the starting position, stretching every joint fully. Repeat this exercise 5-10 times with each hand. Tabletop Stand or sit with your arm, hand, and all five fingers pointed straight up. Make sure to keep your wrist straight during the exercise. Gently bend your fingers at the big knuckle, where your fingers meet your hand, as far down as you can while keeping the small knuckles in your fingers straight. Think of forming a tabletop with your fingers. Hold this position for __________ seconds. Extend your fingers back to the starting position, stretching every joint fully. Repeat this exercise 5-10 times with each hand. Finger spread Place your hand flat on a table with your palm facing down. Make sure your wrist stays straight as you do this exercise. Spread your fingers and thumb apart from each other as far as you can until you feel a gentle stretch. Hold this position for __________ seconds. Bring your fingers and thumb tight together again. Hold this position for __________ seconds. Repeat this exercise 5-10 times with each hand. Making circles Stand or sit with your arm, hand, and all five fingers pointed straight up. Make sure to keep your   wrist straight during the exercise. Make a circle by touching the tip of your thumb to the tip of your index finger. Hold for __________ seconds. Then open your hand wide. Repeat this motion with your thumb and each finger on your hand. Repeat this exercise 5-10 times with each hand. Thumb motion Sit  with your forearm resting on a table and your wrist straight. Your thumb should be facing up toward the ceiling. Keep your fingers relaxed as you move your thumb. Lift your thumb up as high as you can toward the ceiling. Hold for __________ seconds. Bend your thumb across your palm as far as you can, reaching the tip of your thumb for the small finger (pinkie) side of your palm. Hold for __________ seconds. Repeat this exercise 5-10 times with each hand. Grip strengthening  Hold a stress ball or other soft ball in the middle of your hand. Slowly increase the pressure, squeezing the ball as much as you can without causing pain. Think of bringing the tips of your fingers into the middle of your palm. All of your finger joints should bend when doing this exercise. Hold your squeeze for __________ seconds, then relax. Repeat this exercise 5-10 times with each hand. Contact a health care provider if: Your hand pain or discomfort gets much worse when you do an exercise. Your hand pain or discomfort does not improve within 2 hours after you exercise. If you have any of these problems, stop doing these exercises right away. Do not do them again unless your health care provider says that you can. Get help right away if: You develop sudden, severe hand pain or swelling. If this happens, stop doing these exercises right away. Do not do them again unless your health care provider says that you can. This information is not intended to replace advice given to you by your health care provider. Make sure you discuss any questions you have with your healthcare provider. Document Revised: 03/10/2019 Document Reviewed: 11/18/2018 Elsevier Patient Education  2022 [employer]  

## 2021-07-14 ENCOUNTER — Other Ambulatory Visit: Payer: Self-pay | Admitting: Physician Assistant

## 2021-07-15 ENCOUNTER — Ambulatory Visit: Payer: 59 | Admitting: Rheumatology

## 2021-07-15 NOTE — Telephone Encounter (Signed)
Next Visit: 10/10/2021  Last Visit: 07/10/2021  Labs: 06/10/2021 Chloride 107, CO2 16  Eye exam: 01/01/2021 normal    Current Dose per office note 07/10/2021: Plaquenil 200 mg twice daily Monday to Friday.  WL:[id number] disease   Last Fill: 04/03/2021  Okay to refill Plaquenil?

## 2021-07-18 ENCOUNTER — Ambulatory Visit (INDEPENDENT_AMBULATORY_CARE_PROVIDER_SITE_OTHER): Payer: 59 | Admitting: Otolaryngology

## 2021-08-08 ENCOUNTER — Ambulatory Visit (INDEPENDENT_AMBULATORY_CARE_PROVIDER_SITE_OTHER): Payer: 59 | Admitting: Otolaryngology

## 2021-09-02 ENCOUNTER — Ambulatory Visit (INDEPENDENT_AMBULATORY_CARE_PROVIDER_SITE_OTHER): Payer: 59 | Admitting: Otolaryngology

## 2021-10-10 ENCOUNTER — Ambulatory Visit: Payer: 59 | Admitting: Physician Assistant

## 2021-10-21 ENCOUNTER — Encounter: Payer: 59 | Admitting: Nurse Practitioner

## 2021-10-23 ENCOUNTER — Other Ambulatory Visit: Payer: Self-pay | Admitting: Rheumatology

## 2021-12-18 ENCOUNTER — Telehealth: Payer: Self-pay

## 2021-12-18 NOTE — Telephone Encounter (Signed)
Patient called stating she is experiencing "intense pain" in her left arm from her fingertips to her shoulder.  Patient states she is worried that she might need to apply for disability due to not being able to work.  Patient requested to see Dr. Corliss Skains or Ladona Ridgel ASAP.  Please advise.

## 2021-12-23 ENCOUNTER — Other Ambulatory Visit: Payer: Self-pay

## 2021-12-23 ENCOUNTER — Ambulatory Visit: Payer: Managed Care, Other (non HMO) | Admitting: Physician Assistant

## 2021-12-23 ENCOUNTER — Encounter: Payer: Self-pay | Admitting: *Deleted

## 2021-12-23 ENCOUNTER — Encounter: Payer: Self-pay | Admitting: Physician Assistant

## 2021-12-23 VITALS — BP 127/75 | HR 87 | Resp 12 | Ht 65.0 in | Wt 170.4 lb

## 2021-12-23 DIAGNOSIS — Z79899 Other long term (current) drug therapy: Secondary | ICD-10-CM | POA: Diagnosis not present

## 2021-12-23 DIAGNOSIS — R5383 Other fatigue: Secondary | ICD-10-CM

## 2021-12-23 DIAGNOSIS — F419 Anxiety disorder, unspecified: Secondary | ICD-10-CM

## 2021-12-23 DIAGNOSIS — M359 Systemic involvement of connective tissue, unspecified: Secondary | ICD-10-CM | POA: Diagnosis not present

## 2021-12-23 DIAGNOSIS — F32A Depression, unspecified: Secondary | ICD-10-CM

## 2021-12-23 DIAGNOSIS — Z8719 Personal history of other diseases of the digestive system: Secondary | ICD-10-CM

## 2021-12-23 DIAGNOSIS — M79641 Pain in right hand: Secondary | ICD-10-CM

## 2021-12-23 DIAGNOSIS — J302 Other seasonal allergic rhinitis: Secondary | ICD-10-CM

## 2021-12-23 DIAGNOSIS — R590 Localized enlarged lymph nodes: Secondary | ICD-10-CM | POA: Diagnosis not present

## 2021-12-23 DIAGNOSIS — M79642 Pain in left hand: Secondary | ICD-10-CM

## 2021-12-23 DIAGNOSIS — Z8261 Family history of arthritis: Secondary | ICD-10-CM

## 2021-12-23 DIAGNOSIS — Z8269 Family history of other diseases of the musculoskeletal system and connective tissue: Secondary | ICD-10-CM

## 2021-12-23 MED ORDER — PREDNISONE 5 MG PO TABS: ORAL_TABLET | ORAL | 0 refills | Status: DC

## 2021-12-23 NOTE — Progress Notes (Signed)
Office Visit Note  Patient: Joanne Wallace             Date of Birth: 26-Aug-1996           MRN: [id number]             PCP: Minette Brine, FNP Referring: Minette Brine, FNP Visit Date: 12/23/2021 Occupation: @GUAROCC @  Subjective:  Pain in multiple joints   History of Present Illness: Joanne Wallace is a 26 y.o. female with history of autoimmune disease.  Patient has been off of Plaquenil since October 2022.  She reports that she was previously tolerating Plaquenil without any side effects.  She had noticed a significant improvement in her joint pain and inflammation while taking Plaquenil as prescribed.  She ran out of the prescription for Plaquenil due to not having updated lab work or follow-up visit.  She is currently having pain and inflammation involving multiple joints.  Her symptoms have been most severe in both hands and both wrist joints.  She has had difficulty performing responsibilities at work as well as ADLs at home.  She takes aleve or ibuprofen as needed for symptomatic relief.  She states that she has done more research about mixed connective tissue disease and has been trying to understand her disease as well as the importance of taking Plaquenil. She denies any recent rashes, hair loss, Raynaud's phenomenon. She denies any shortness of breath or palpitations. She experiences intermittent oral ulcers and has some mouth dryness.    Activities of Daily Living:  Patient reports morning stiffness for 2 hours  Patient Denies nocturnal pain.  Difficulty dressing/grooming: Denies Difficulty climbing stairs: Reports Difficulty getting out of chair: Reports Difficulty using hands for taps, buttons, cutlery, and/or writing: Reports  Review of Systems  Constitutional:  Positive for fatigue.  HENT:  Positive for mouth sores and mouth dryness. Negative for nose dryness.   Eyes:  Negative for pain, visual disturbance and dryness.  Respiratory:  Negative for cough, hemoptysis,  shortness of breath and difficulty breathing.   Cardiovascular:  Negative for chest pain, palpitations, hypertension and swelling in legs/feet.  Gastrointestinal:  Negative for blood in stool, constipation and diarrhea.  Endocrine: Negative for increased urination.  Genitourinary:  Negative for painful urination.  Musculoskeletal:  Positive for joint pain, joint pain, joint swelling and morning stiffness. Negative for myalgias, muscle weakness, muscle tenderness and myalgias.  Skin:  Negative for color change, pallor, rash, hair loss, nodules/bumps, skin tightness, ulcers and sensitivity to sunlight.  Allergic/Immunologic: Negative for susceptible to infections.  Neurological:  Negative for dizziness, numbness, headaches and weakness.  Hematological:  Negative for swollen glands.  Psychiatric/Behavioral:  Positive for depressed mood. Negative for sleep disturbance. The patient is nervous/anxious.    PMFS History:  Patient Active Problem List   Diagnosis Date Noted   Autoimmune disease ([place]) 09/26/2020   IBS (irritable bowel syndrome) 05/07/2019   Menstrual changes 05/07/2019   Fatigue 05/07/2019    Past Medical History:  Diagnosis Date   IBS (irritable bowel syndrome)     Family History  Problem Relation Age of Onset   Breast cancer Mother    Hypothyroidism Mother    Hypothyroidism Father    Healthy Brother    Rheum arthritis Paternal Aunt    Arthritis Maternal Grandmother    Osteoarthritis Paternal Aunt    Past Surgical History:  Procedure Laterality Date   HERNIA REPAIR     double   TONSILLECTOMY AND ADENOIDECTOMY     Social History  Social History Narrative   Not on file   Immunization History  Administered Date(s) Administered   Influenza,inj,Quad PF,6+ Mos 10/16/2020   Moderna Sars-Covid-2 Vaccination 02/09/2020, 03/08/2020, 11/01/2020   Tdap 10/16/2020     Objective: Vital Signs: BP 127/75 (BP Location: Left Arm, Patient Position: Sitting, Cuff Size: Small)     Pulse 87    Resp 12    Ht 5' 5"  (1.651 m)    Wt 170 lb 6.4 oz (77.3 kg)    BMI 28.36 kg/m    Physical Exam Vitals and nursing note reviewed.  Constitutional:      Appearance: She is well-developed.  HENT:     Head: Normocephalic and atraumatic.  Eyes:     Conjunctiva/sclera: Conjunctivae normal.  Cardiovascular:     Rate and Rhythm: Regular rhythm.  Pulmonary:     Effort: Pulmonary effort is normal.  Abdominal:     Palpations: Abdomen is soft.  Musculoskeletal:     Cervical back: Normal range of motion.  Skin:    General: Skin is warm and dry.     Capillary Refill: Capillary refill takes less than 2 seconds.  Neurological:     Mental Status: She is alert and oriented to person, place, and time.  Psychiatric:        Behavior: Behavior normal.     Musculoskeletal Exam: C-spine, thoracic spine, lumbar spine have good range of motion with no discomfort.  Shoulder joints have good range of motion with no discomfort.  Tenderness along the elbow joint line of both elbows.  Tenderness over both wrist joints with some warmth in the left wrist.  Tenderness of the right second MCP and left second through fifth MCP joints.  Complete fist formation bilaterally.  Hip joints have good range of motion with no groin pain.  Painful range of motion of both knee joints.  Warmth and swelling of the left knee joint noted.  Ankle joints have good range of motion with tenderness bilaterally. No tenderness over MTP joints.   CDAI Exam: CDAI Score: -- Patient Global: --; Provider Global: -- Swollen: 1 ; Tender: 13  Joint Exam 12/23/2021      Right  Left  Elbow   Tender   Tender  Wrist   Tender   Tender  MCP 2   Tender   Tender  MCP 3      Tender  MCP 4      Tender  MCP 5      Tender  Knee   Tender  Swollen Tender  Ankle   Tender   Tender     Investigation: No additional findings.  Imaging: No results found.  Recent Labs: Lab Results  Component Value Date   WBC 5.2 06/10/2021   HGB  13.9 06/10/2021   PLT 274 06/10/2021   NA 140 06/10/2021   K 4.1 06/10/2021   CL 107 (H) 06/10/2021   CO2 16 (L) 06/10/2021   GLUCOSE 91 06/10/2021   BUN 6 06/10/2021   CREATININE 0.57 06/10/2021   BILITOT 0.6 02/06/2021   ALKPHOS 55 06/14/2020   AST 16 02/06/2021   ALT 11 02/06/2021   PROT 7.7 02/06/2021   ALBUMIN 4.6 06/14/2020   CALCIUM 9.1 06/10/2021   GFRAA 144 02/06/2021    Speciality Comments: PLQ EYE EXAM 01/01/2021 normal [facility] 1 year  Procedures:  No procedures performed Allergies: Latex   Assessment / Plan:     Visit Diagnoses: Autoimmune disease ([place]) - Positive ANA, positive RNP, positive  Ro, arthritis: She presents today with increased pain and inflammation involving multiple joints she has been out of the prescription for Plaquenil since October 2022.  She was previously tolerating Plaquenil without any side effects and had noticed significant clinical improvement on therapy.  Since being off of Plaquenil she has noticed increased fatigue, arthralgias, joint swelling, and intermittent oral ulcerations.  She has had difficulty performing ADLs as well as responsibilities at work due to severity of pain and inflammation she has been experiencing.  On examination today she had warmth and swelling in the left knee joint and has tenderness over both elbows, both wrists, and several MCP joints.  She has tried taking Aleve and ibuprofen with minimal relief.  A prednisone taper starting at 20 mg tapering by 5 mg every 2 days was sent to the pharmacy today.  Instructions were provided about the dosing regimen.  She was advised to take prednisone in the morning with food.   Lab work from 02/06/2021 was reviewed today in the office: Double-stranded DNA negative, complements within normal limits, ESR within normal limits, RNP 2.5, ANA 1:40NS, and Ro positive.  The following lab work will be updated today and a prescription for Plaquenil will be sent to the pharmacy pending  CBC and CMP results.  She was advised to notify us if her symptoms do not improve as she begins Plaquenil time to get in her system.  Discussed the importance of following up closely with lab monitoring as well as office visits until her disease is better controlled.  She was in agreement.  She will follow-up in the office in 2 months.   Plan: CBC with Differential/Platelet, Urinalysis, Routine w reflex microscopic, COMPLETE METABOLIC PANEL WITH GFR, ANA, Anti-DNA antibody, double-stranded, C3 and C4, Sedimentation rate, RNP Antibody, Sjogrens syndrome-A extractable nuclear antibody, predniSONE (DELTASONE) 5 MG tablet  High risk medication use - She will be restarting plaquenil 200 mg 1 tablet by mouth twice daily Monday through Friday pending lab results today.  Plaquenil eye exam updated on 01/01/21.  She will be due for updated plaquenil eye exam in February 2023.  CBC and CMP will be drawn today.  She will continue to require updated lab work every 5 months to monitor for drug toxicity. Plan: CBC with Differential/Platelet, COMPLETE METABOLIC PANEL WITH GFR  Pain in both hands - XR of both hands unremarkable on 08/13/20.  U/s of both hands negative for synovitis.   Cervical lymphadenopathy: History of   Other fatigue: She has been experiencing increased fatigue over the past couple of months.  It has been difficult for her to work her full shift due to the fatigue and arthralgias she has been experiencing.  She is concerned about losing her job and just considering filing for disability if her symptoms do not improve. She was given a work note stating she was seen in our office today and reviewed that she can return to work on Friday. She was also given a prescription for prednisone starting at 20 mg tapering by 5 mg every 2 days.   Other medical conditions are listed as follows:   History of IBS  Anxiety and depression  Seasonal allergies  Family history of gout  Family history of rheumatoid  arthritis  Orders: Orders Placed This Encounter  Procedures   CBC with Differential/Platelet   Urinalysis, Routine w reflex microscopic   COMPLETE METABOLIC PANEL WITH GFR   ANA   Anti-DNA antibody, double-stranded   C3 and C4   Sedimentation  rate   RNP Antibody   Sjogrens syndrome-A extractable nuclear antibody   Meds ordered this encounter  Medications   predniSONE (DELTASONE) 5 MG tablet    Sig: Take 4 tablets by mouth daily x2, 3 tablets daily x2 days, 2 tablets daily x2 days, 1 tablet daily x2 days.    Dispense:  20 tablet    Refill:  0     Follow-Up Instructions: Return in about 2 months (around 02/20/2022) for Autoimmune Disease.   Ofilia Neas, PA-C  Note - This record has been created using Dragon software.  Chart creation errors have been sought, but may not always  have been located. Such creation errors do not reflect on  the standard of medical care.

## 2021-12-23 NOTE — Telephone Encounter (Signed)
Please refill plaquenil pending CBC and CMP lab results, per Sherron Ales, PA-C. Thanks!

## 2021-12-24 MED ORDER — HYDROXYCHLOROQUINE SULFATE 200 MG PO TABS: ORAL_TABLET | ORAL | 2 refills | Status: DC

## 2021-12-24 NOTE — Telephone Encounter (Signed)
CBC/CMP WNL.

## 2021-12-24 NOTE — Progress Notes (Signed)
UA revealed trace leukocytes.  Negative for nitrites and bacteria.  If she develops symptoms of a UTI she should be evaluated by PCP.  CBC and CMP WNL.   ESR WNL.

## 2021-12-24 NOTE — Progress Notes (Signed)
RNP and Ro antibodies remain positive. dsDNA is negative.  Complements WNL.

## 2021-12-25 LAB — URINALYSIS, ROUTINE W REFLEX MICROSCOPIC
Bacteria, UA: NONE SEEN /HPF
Bilirubin Urine: NEGATIVE
Glucose, UA: NEGATIVE
Hgb urine dipstick: NEGATIVE
Hyaline Cast: NONE SEEN /LPF
Ketones, ur: NEGATIVE
Nitrite: NEGATIVE
Protein, ur: NEGATIVE
RBC / HPF: NONE SEEN /HPF (ref 0–2)
Specific Gravity, Urine: 1.007 (ref 1.001–1.035)
Squamous Epithelial / HPF: NONE SEEN /HPF (ref <=5)
WBC, UA: NONE SEEN /HPF (ref 0–5)
pH: 8 (ref 5.0–8.0)

## 2021-12-25 LAB — COMPLETE METABOLIC PANEL WITH GFR
AG Ratio: 1.5 (calc) (ref 1.0–2.5)
ALT: 21 U/L (ref 6–29)
AST: 21 U/L (ref 10–30)
Albumin: 4.5 g/dL (ref 3.6–5.1)
Alkaline phosphatase (APISO): 43 U/L (ref 31–125)
BUN: 10 mg/dL (ref 7–25)
CO2: 29 mmol/L (ref 20–32)
Calcium: 9.3 mg/dL (ref 8.6–10.2)
Chloride: 106 mmol/L (ref 98–110)
Creat: 0.69 mg/dL (ref 0.50–0.96)
Globulin: 3 g/dL (calc) (ref 1.9–3.7)
Glucose, Bld: 85 mg/dL (ref 65–99)
Potassium: 4.2 mmol/L (ref 3.5–5.3)
Sodium: 140 mmol/L (ref 135–146)
Total Bilirubin: 0.6 mg/dL (ref 0.2–1.2)
Total Protein: 7.5 g/dL (ref 6.1–8.1)
eGFR: 122 mL/min/{1.73_m2} (ref >=60)

## 2021-12-25 LAB — CBC WITH DIFFERENTIAL/PLATELET
Absolute Monocytes: 340 cells/uL (ref 200–950)
Basophils Absolute: 59 cells/uL (ref 0–200)
Basophils Relative: 1.4 %
Eosinophils Absolute: 71 cells/uL (ref 15–500)
Eosinophils Relative: 1.7 %
HCT: 42.8 % (ref 35.0–45.0)
Hemoglobin: 14.3 g/dL (ref 11.7–15.5)
Lymphs Abs: 1466 cells/uL (ref 850–3900)
MCH: 30.5 pg (ref 27.0–33.0)
MCHC: 33.4 g/dL (ref 32.0–36.0)
MCV: 91.3 fL (ref 80.0–100.0)
MPV: 11.7 fL (ref 7.5–12.5)
Monocytes Relative: 8.1 %
Neutro Abs: 2264 cells/uL (ref 1500–7800)
Neutrophils Relative %: 53.9 %
Platelets: 259 10*3/uL (ref 140–400)
RBC: 4.69 10*6/uL (ref 3.80–5.10)
RDW: 12 % (ref 11.0–15.0)
Total Lymphocyte: 34.9 %
WBC: 4.2 10*3/uL (ref 3.8–10.8)

## 2021-12-25 LAB — ANTI-NUCLEAR AB-TITER (ANA TITER): ANA Titer 1: 1:80 {titer} — ABNORMAL HIGH

## 2021-12-25 LAB — SJOGRENS SYNDROME-A EXTRACTABLE NUCLEAR ANTIBODY: SSA (Ro) (ENA) Antibody, IgG: 3.5 AI — AB

## 2021-12-25 LAB — SEDIMENTATION RATE: Sed Rate: 2 mm/h (ref 0–20)

## 2021-12-25 LAB — C3 AND C4
C3 Complement: 123 mg/dL (ref 83–193)
C4 Complement: 17 mg/dL (ref 15–57)

## 2021-12-25 LAB — ANTI-DNA ANTIBODY, DOUBLE-STRANDED: ds DNA Ab: 4 IU/mL

## 2021-12-25 LAB — ANA: Anti Nuclear Antibody (ANA): POSITIVE — AB

## 2021-12-25 LAB — RNP ANTIBODY: Ribonucleic Protein(ENA) Antibody, IgG: 2.7 AI — AB

## 2021-12-25 NOTE — Progress Notes (Signed)
ANA remains positive but is a low titer.

## 2022-01-23 ENCOUNTER — Other Ambulatory Visit: Payer: Self-pay | Admitting: Rheumatology

## 2022-01-23 MED ORDER — HYDROXYCHLOROQUINE SULFATE 200 MG PO TABS: ORAL_TABLET | ORAL | 2 refills | Status: DC

## 2022-01-23 NOTE — Telephone Encounter (Signed)
Next Visit: 02/24/2022  Last Visit: 12/23/2021  Labs: 12/23/2021 CBC and CMP WNL.  Eye exam: 01/01/2021 normal    Current Dose per office note 12/23/2021: plaquenil 200 mg 1 tablet by mouth twice daily Monday through Friday  DX: Autoimmune disease   Last Fill:   Okay to refill Plaquenil?

## 2022-01-23 NOTE — Telephone Encounter (Signed)
Patient called the office requesting a refill of Plaquenil to be sent to [facility] on [address]. Patient states she has an eye exam scheduled for the 2nd week in March.

## 2022-02-10 NOTE — Progress Notes (Unsigned)
Office Visit Note  Patient: Joanne Wallace             Date of Birth: Dec 22, 1995           MRN: [id number]             PCP: Minette Brine, FNP Referring: Minette Brine, FNP Visit Date: 02/24/2022 Occupation: @GUAROCC @  Subjective:  No chief complaint on file.   History of Present Illness: Joanne Wallace is a 26 y.o. female ***   Activities of Daily Living:  Patient reports morning stiffness for *** {minute/hour:19697}.   Patient {ACTIONS;DENIES/REPORTS:21021675::"Denies"} nocturnal pain.  Difficulty dressing/grooming: {ACTIONS;DENIES/REPORTS:21021675::"Denies"} Difficulty climbing stairs: {ACTIONS;DENIES/REPORTS:21021675::"Denies"} Difficulty getting out of chair: {ACTIONS;DENIES/REPORTS:21021675::"Denies"} Difficulty using hands for taps, buttons, cutlery, and/or writing: {ACTIONS;DENIES/REPORTS:21021675::"Denies"}  No Rheumatology ROS completed.   PMFS History:  Patient Active Problem List   Diagnosis Date Noted   Autoimmune disease ([place]) 09/26/2020   IBS (irritable bowel syndrome) 05/07/2019   Menstrual changes 05/07/2019   Fatigue 05/07/2019    Past Medical History:  Diagnosis Date   IBS (irritable bowel syndrome)     Family History  Problem Relation Age of Onset   Breast cancer Mother    Hypothyroidism Mother    Hypothyroidism Father    Healthy Brother    Rheum arthritis Paternal Aunt    Arthritis Maternal Grandmother    Osteoarthritis Paternal Aunt    Past Surgical History:  Procedure Laterality Date   HERNIA REPAIR     double   TONSILLECTOMY AND ADENOIDECTOMY     Social History   Social History Narrative   Not on file   Immunization History  Administered Date(s) Administered   Influenza,inj,Quad PF,6+ Mos 10/16/2020   Moderna Sars-Covid-2 Vaccination 02/09/2020, 03/08/2020, 11/01/2020   Tdap 10/16/2020     Objective: Vital Signs: There were no vitals taken for this visit.   Physical Exam   Musculoskeletal Exam: ***  CDAI Exam: CDAI  Score: -- Patient Global: --; Provider Global: -- Swollen: --; Tender: -- Joint Exam 02/24/2022   No joint exam has been documented for this visit   There is currently no information documented on the homunculus. Go to the Rheumatology activity and complete the homunculus joint exam.  Investigation: No additional findings.  Imaging: No results found.  Recent Labs: Lab Results  Component Value Date   WBC 4.2 12/23/2021   HGB 14.3 12/23/2021   PLT 259 12/23/2021   NA 140 12/23/2021   K 4.2 12/23/2021   CL 106 12/23/2021   CO2 29 12/23/2021   GLUCOSE 85 12/23/2021   BUN 10 12/23/2021   CREATININE 0.69 12/23/2021   BILITOT 0.6 12/23/2021   ALKPHOS 55 06/14/2020   AST 21 12/23/2021   ALT 21 12/23/2021   PROT 7.5 12/23/2021   ALBUMIN 4.6 06/14/2020   CALCIUM 9.3 12/23/2021   GFRAA 144 02/06/2021    Speciality Comments: PLQ EYE EXAM 01/01/2021 normal [facility] 1 year  Procedures:  No procedures performed Allergies: Latex   Assessment / Plan:     Visit Diagnoses: No diagnosis found.  Orders: No orders of the defined types were placed in this encounter.  No orders of the defined types were placed in this encounter.   Face-to-face time spent with patient was *** minutes. Greater than 50% of time was spent in counseling and coordination of care.  Follow-Up Instructions: No follow-ups on file.   Earnestine Mealing, CMA  Note - This record has been created using Editor, commissioning.  Chart creation errors have been  sought, but may not always  have been located. Such creation errors do not reflect on  the standard of medical care.

## 2022-02-11 ENCOUNTER — Other Ambulatory Visit: Payer: Self-pay | Admitting: *Deleted

## 2022-02-24 ENCOUNTER — Other Ambulatory Visit: Payer: Self-pay

## 2022-02-24 ENCOUNTER — Encounter: Payer: Self-pay | Admitting: Physician Assistant

## 2022-02-24 ENCOUNTER — Ambulatory Visit: Payer: Managed Care, Other (non HMO) | Admitting: Physician Assistant

## 2022-02-24 VITALS — BP 118/81 | HR 62 | Ht 65.0 in | Wt 171.8 lb

## 2022-02-24 DIAGNOSIS — Z8261 Family history of arthritis: Secondary | ICD-10-CM

## 2022-02-24 DIAGNOSIS — M359 Systemic involvement of connective tissue, unspecified: Secondary | ICD-10-CM

## 2022-02-24 DIAGNOSIS — J302 Other seasonal allergic rhinitis: Secondary | ICD-10-CM

## 2022-02-24 DIAGNOSIS — R5383 Other fatigue: Secondary | ICD-10-CM

## 2022-02-24 DIAGNOSIS — F32A Depression, unspecified: Secondary | ICD-10-CM

## 2022-02-24 DIAGNOSIS — F419 Anxiety disorder, unspecified: Secondary | ICD-10-CM

## 2022-02-24 DIAGNOSIS — Z79899 Other long term (current) drug therapy: Secondary | ICD-10-CM

## 2022-02-24 DIAGNOSIS — R590 Localized enlarged lymph nodes: Secondary | ICD-10-CM | POA: Diagnosis not present

## 2022-02-24 DIAGNOSIS — M79642 Pain in left hand: Secondary | ICD-10-CM

## 2022-02-24 DIAGNOSIS — M79641 Pain in right hand: Secondary | ICD-10-CM | POA: Diagnosis not present

## 2022-02-24 DIAGNOSIS — Z8719 Personal history of other diseases of the digestive system: Secondary | ICD-10-CM

## 2022-02-24 DIAGNOSIS — Z8269 Family history of other diseases of the musculoskeletal system and connective tissue: Secondary | ICD-10-CM

## 2022-02-25 LAB — ANTI-DNA ANTIBODY, DOUBLE-STRANDED: ds DNA Ab: 2 IU/mL

## 2022-02-25 LAB — URINALYSIS, ROUTINE W REFLEX MICROSCOPIC
Bacteria, UA: NONE SEEN /HPF
Bilirubin Urine: NEGATIVE
Glucose, UA: NEGATIVE
Hyaline Cast: NONE SEEN /LPF
Ketones, ur: NEGATIVE
Leukocytes,Ua: NEGATIVE
Nitrite: NEGATIVE
Protein, ur: NEGATIVE
RBC / HPF: NONE SEEN /HPF (ref 0–2)
Specific Gravity, Urine: 1.005 (ref 1.001–1.035)
Squamous Epithelial / HPF: NONE SEEN /HPF (ref <=5)
WBC, UA: NONE SEEN /HPF (ref 0–5)
pH: 7.5 (ref 5.0–8.0)

## 2022-02-25 LAB — CBC WITH DIFFERENTIAL/PLATELET
Absolute Monocytes: 378 cells/uL (ref 200–950)
Basophils Absolute: 60 cells/uL (ref 0–200)
Basophils Relative: 1.4 %
Eosinophils Absolute: 69 cells/uL (ref 15–500)
Eosinophils Relative: 1.6 %
HCT: 41.8 % (ref 35.0–45.0)
Hemoglobin: 13.7 g/dL (ref 11.7–15.5)
Lymphs Abs: 1677 cells/uL (ref 850–3900)
MCH: 29.9 pg (ref 27.0–33.0)
MCHC: 32.8 g/dL (ref 32.0–36.0)
MCV: 91.3 fL (ref 80.0–100.0)
MPV: 11.7 fL (ref 7.5–12.5)
Monocytes Relative: 8.8 %
Neutro Abs: 2116 cells/uL (ref 1500–7800)
Neutrophils Relative %: 49.2 %
Platelets: 228 10*3/uL (ref 140–400)
RBC: 4.58 10*6/uL (ref 3.80–5.10)
RDW: 11.9 % (ref 11.0–15.0)
Total Lymphocyte: 39 %
WBC: 4.3 10*3/uL (ref 3.8–10.8)

## 2022-02-25 LAB — SEDIMENTATION RATE: Sed Rate: 2 mm/h (ref 0–20)

## 2022-02-25 LAB — COMPLETE METABOLIC PANEL WITH GFR
AG Ratio: 1.7 (calc) (ref 1.0–2.5)
ALT: 11 U/L (ref 6–29)
AST: 13 U/L (ref 10–30)
Albumin: 4.5 g/dL (ref 3.6–5.1)
Alkaline phosphatase (APISO): 38 U/L (ref 31–125)
BUN: 7 mg/dL (ref 7–25)
CO2: 25 mmol/L (ref 20–32)
Calcium: 9.1 mg/dL (ref 8.6–10.2)
Chloride: 109 mmol/L (ref 98–110)
Creat: 0.7 mg/dL (ref 0.50–0.96)
Globulin: 2.6 g/dL (calc) (ref 1.9–3.7)
Glucose, Bld: 78 mg/dL (ref 65–99)
Potassium: 4.1 mmol/L (ref 3.5–5.3)
Sodium: 140 mmol/L (ref 135–146)
Total Bilirubin: 0.5 mg/dL (ref 0.2–1.2)
Total Protein: 7.1 g/dL (ref 6.1–8.1)
eGFR: 121 mL/min/{1.73_m2} (ref >=60)

## 2022-02-25 LAB — C3 AND C4
C3 Complement: 121 mg/dL (ref 83–193)
C4 Complement: 18 mg/dL (ref 15–57)

## 2022-02-25 LAB — MICROSCOPIC MESSAGE

## 2022-02-25 NOTE — Progress Notes (Signed)
UA: 1+Hgb.  Patient is currently on her menstrual cycle.  CBC and CMP WNL.  ESR WNL.  Complements WNL.

## 2022-02-26 NOTE — Progress Notes (Signed)
dsDNA is negative.  Labs are not consistent with a flare. No changes recommended at this time.

## 2022-03-21 ENCOUNTER — Other Ambulatory Visit: Payer: Self-pay | Admitting: *Deleted

## 2022-03-21 MED ORDER — HYDROXYCHLOROQUINE SULFATE 200 MG PO TABS: ORAL_TABLET | ORAL | 2 refills | Status: DC

## 2022-03-21 NOTE — Telephone Encounter (Signed)
Refill request received via fax ? ?Next Visit: 06/09/2022 ? ?Last Visit: 02/24/2022 ? ?Labs: 02/24/2022 CBC and CMP WNL ? ?Eye exam: 02/19/2022 WNL  ? ?Current Dose per office note 02/24/2022: Plaquenil 200 mg 1 tablet by mouth twice daily Monday through Friday. ? ?[id number] disease ? ?Last Fill: 01/23/2022 ? ?Okay to refill Plaquenil?  ?

## 2022-03-29 IMAGING — CT CT NECK W/ CM
4 of 6 series · 12 of 33 positions shown, 14 images · IV contrast (iopamidol)
Comparison: No pertinent prior exams available for comparison.

CLINICAL DATA: Sore throat CW9.J ([id number]-CM). Lymphadenopathy,
neck. Additional history provided by scanning technologist: Patient
reports lymphadenopathy, neck, sore throat, fever, chills since May 13, 2021, history of tonsil and adenoid surgery.

EXAM:
CT NECK WITH CONTRAST
TECHNIQUE: Multidetector CT imaging of the neck was performed using the
standard protocol following the bolus administration of intravenous
contrast.
CONTRAST:  75mL 05AAM0-2NN IOPAMIDOL (05AAM0-2NN) INJECTION 61%

[Series 3: neck 2.00 br40 s3 st/ no angle · axial · 0.34mm/px · z∈[-728,-642]mm · 2 of 129 slices shown, 3 images]
[im 43/129  soft-tissue]
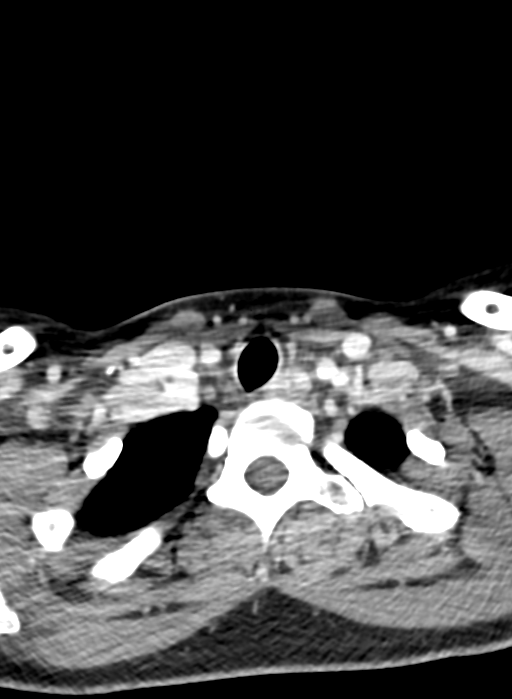
[im 43/129  bone]
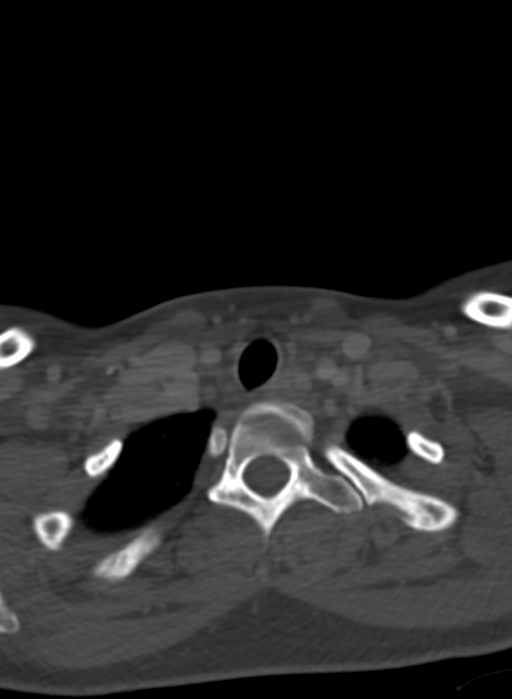
[im 86/129  bone]
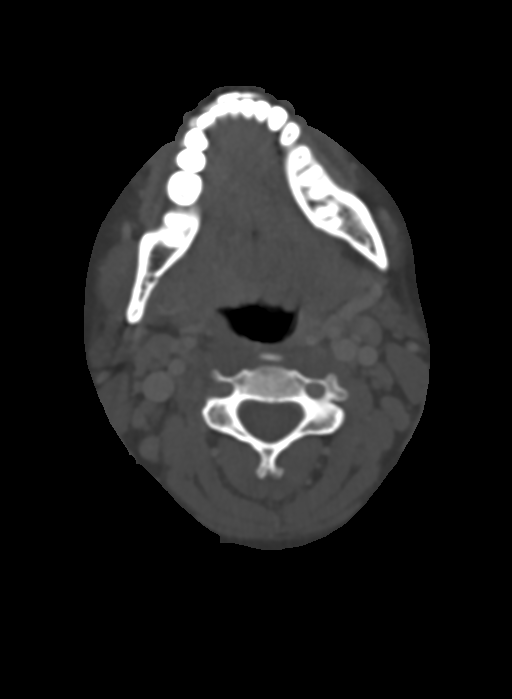

[Series 5: neck 2.00 br60 s3 bone/ no angle · axial · 0.34mm/px · z∈[-728,-642]mm · 2 of 129 slices shown]
[im 43/129  bone]
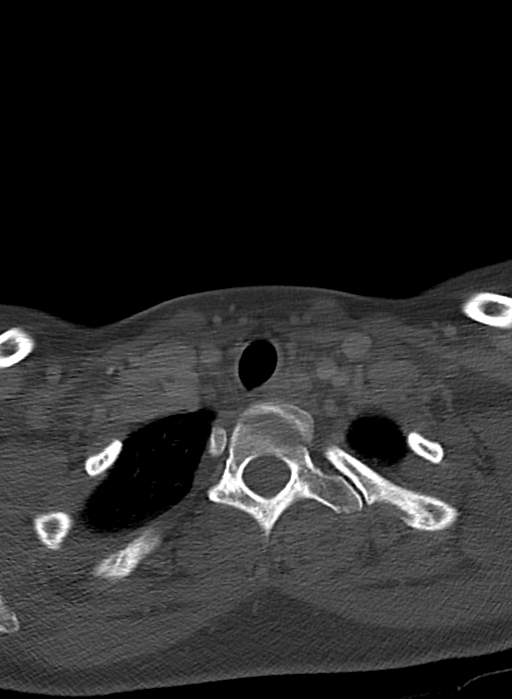
[im 86/129  bone]
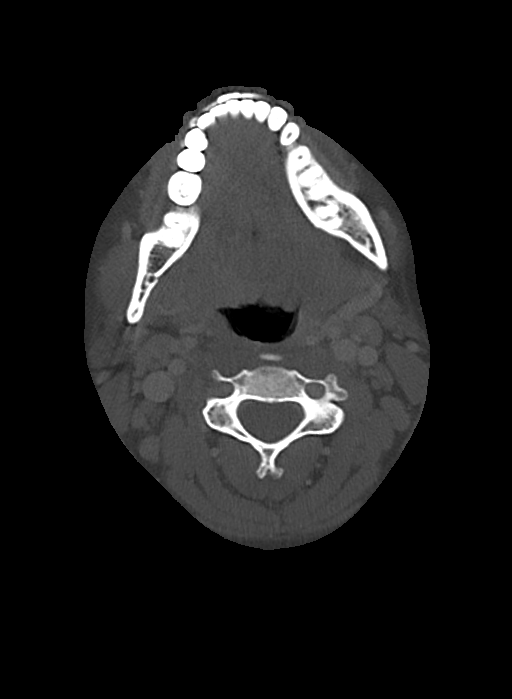

[Series 11: neck 2.00 br40 s3 (person_name) · coronal · 0.34mm/px · 3 of 117 slices shown (1 of 2)]
[im 24/117  bone]
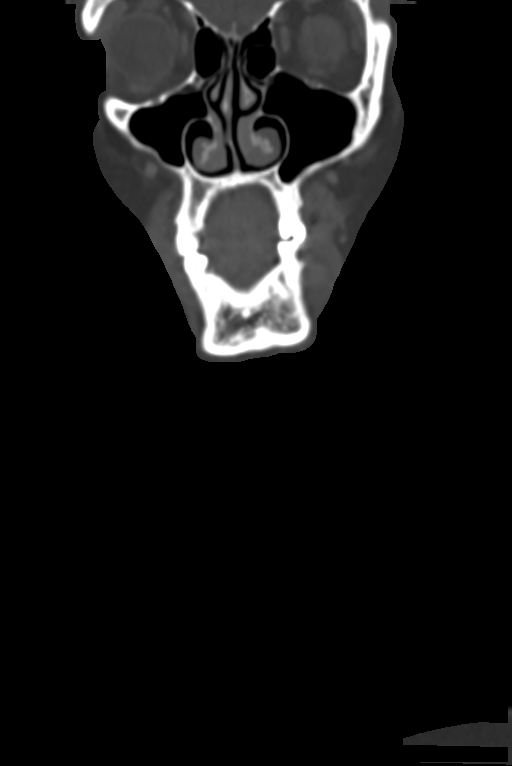
[im 47/117  bone]
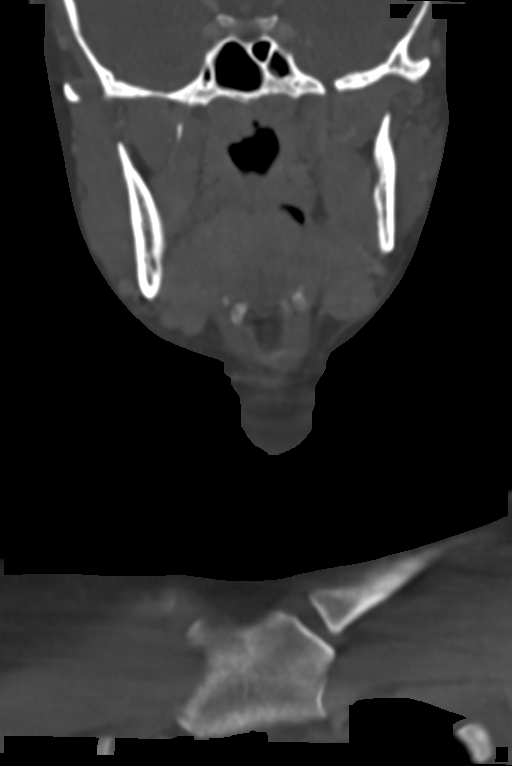
[im 70/117  bone]
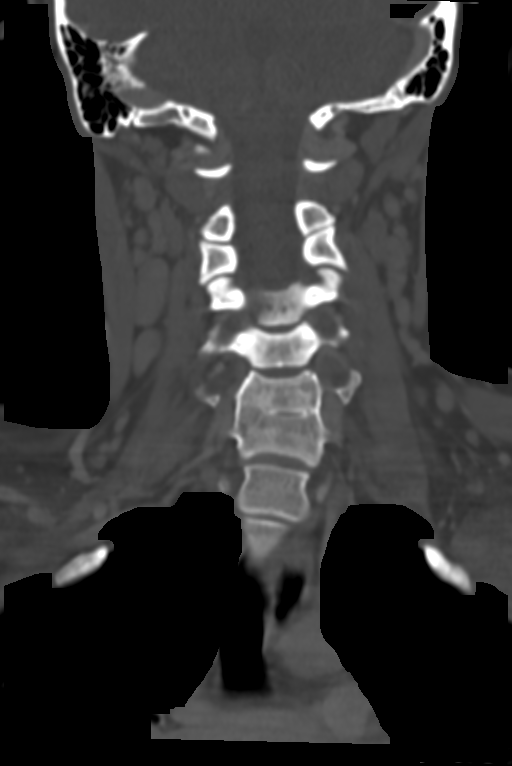

[Series 13: neck 2.00 br40 s3 (person_name) · sagittal · 0.46mm/px · 5 of 86 slices shown, 6 images (2 of 2)]
[im 29/86  bone]
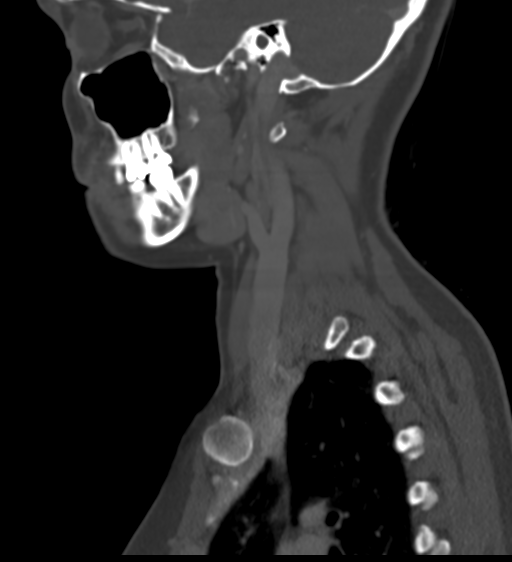
[im 36/86  bone]
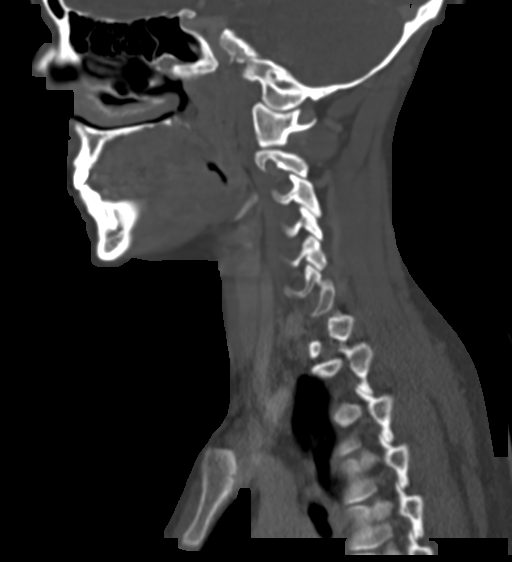
[im 43/86  soft-tissue]
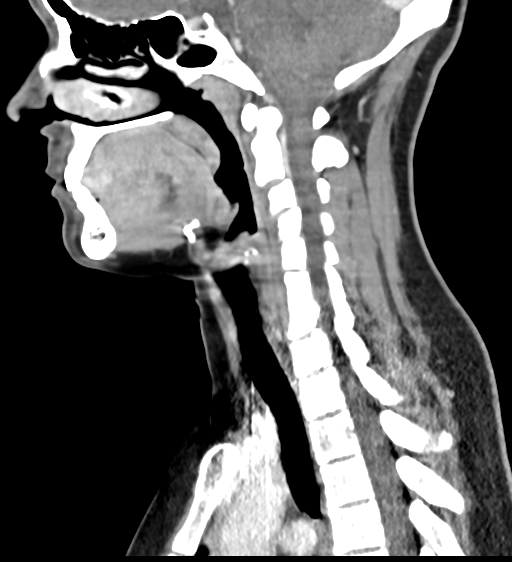
[im 43/86  bone]
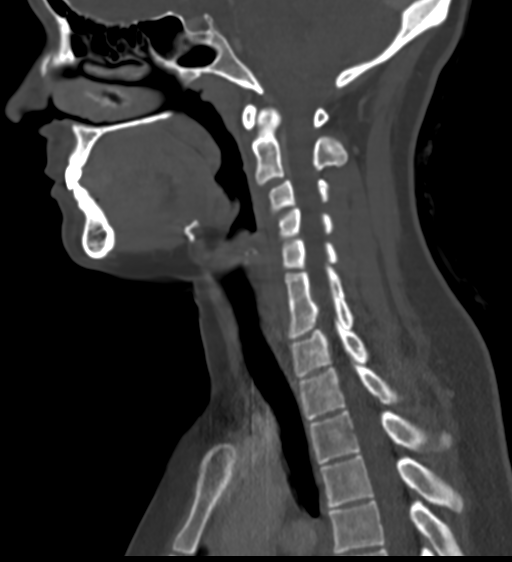
[im 50/86  bone]
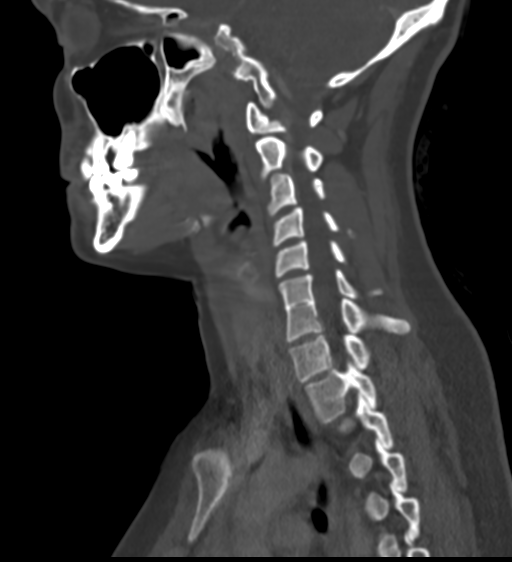
[im 57/86  bone]
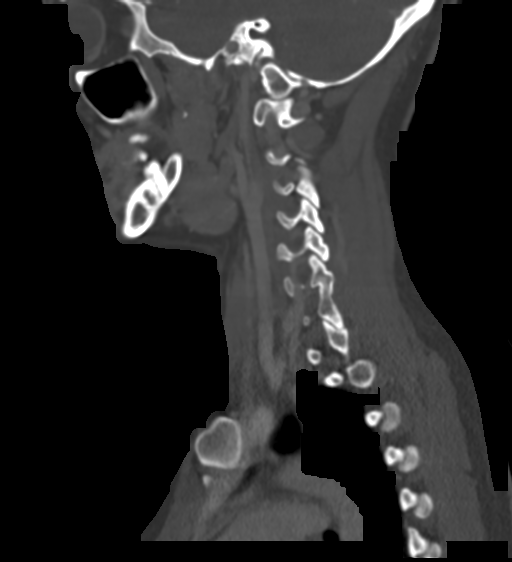

[12 of 33 positions shown; findings below may reference images not displayed]

FINDINGS: Mildly motion degraded exam.

Pharynx and larynx: Nonspecific prominence of the
nasopharyngeal/adenoid soft tissues and lingual tonsils. No
appreciable swelling or discrete mass identified elsewhere within
the oral cavity, pharynx or larynx.

Salivary glands: No inflammation, mass, or stone.

Thyroid: Unremarkable.

Lymph nodes: Mildly enlarged right level [DATE] lymph node measuring 12
mm in short axis (series 13, image 25). Lymph nodes are prominent in
size and number elsewhere within the bilateral neck, although
subcentimeter in short axis.

Vascular: The major vascular structures of the neck are patent.

Limited intracranial: No evidence of acute intracranial abnormality
within the field of view.

Visualized orbits: Incompletely imaged. No mass or acute finding at
the imaged levels.

Mastoids and visualized paranasal sinuses: No significant paranasal
sinus disease or mastoid effusion at the imaged levels.

Skeleton: No acute bony abnormality or aggressive osseous lesion.
Congenital non segmentation of the C6-C7 vertebrae. Trace C2-C3
grade 1 anterolisthesis.

Upper chest: No consolidation within the imaged lung apices.

These results will be called to the ordering clinician or
representative by the Radiologist Assistant, and communication
documented in the PACS or [REDACTED].
IMPRESSION: Prominence of the nasopharyngeal/adenoid soft tissues and lingual
tonsils. Additionally, there is a mildly enlarged right level [DATE]
lymph node measuring 12 mm in short axis. Elsewhere within the neck,
lymph nodes are prominent in size and number, but measure
subcentimeter in short axis. This constellation of findings is
nonspecific, and could reflect an infectious/reactive etiology.
However, a lymphoproliferative process such as lymphoma cannot be
excluded. Clinical correlation recommended. Consider ENT
consultation and direct tissue sampling, as appropriate.

Incidentally noted congenital non-segmentation of the C6-C7
vertebrae.

## 2022-05-07 ENCOUNTER — Telehealth: Payer: Self-pay | Admitting: Rheumatology

## 2022-05-07 NOTE — Telephone Encounter (Signed)
Patient advised she can try biotene products (oral rinse, toothpaste, lozenges) or xylimelts available over the counter.  Advised Ladona Ridgel would also recommend frequent small sips of water throughout the day.   If her symptoms persist or worsen prescription medications to improve her symptoms can be discussed at her upcoming follow up visit. Patient expressed understanding.

## 2022-05-07 NOTE — Telephone Encounter (Signed)
She can try biotene products (oral rinse, toothpaste, lozenges) or xylimelts available over the counter.  I would also recommend frequent small sips of water throughout the day.   If her symptoms persist or worsen prescription medications to improve her symptoms can be discussed at her upcoming follow up visit.

## 2022-05-07 NOTE — Telephone Encounter (Signed)
Patient called stating approximately 3-4 weeks ago began experiencing dry mouth.  Patient states she originally thought she might be dehydrated since she started a new job, but has been drinking a lot of water and it doesn't seem to help.  Patient states she has occasional mouth sores.  Patient requested a return call to let her know if there is anything Dr. Corliss Skains recommends.

## 2022-05-26 NOTE — Progress Notes (Signed)
Office Visit Note  Patient: Joanne Wallace             Date of Birth: [birth year]           MRN: [id number]             PCP: [name], FNP Referring: [name], FNP Visit Date: 06/09/2022 Occupation: @GUAROCC @  Subjective:  No chief complaint on file.   History of Present Illness: Joanne Wallace is a 26 y.o. female history of autoimmune disease.  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.  According to her at work she has to do packaging and and has to pull trays out of bed and packing them.  Its been hard on her elbows and her hands.  She is having discomfort in her left elbow and her left hand is swollen.  She states she has some discomfort in right elbow weight.  She has been experiencing discomfort in her knee joints and some in her feet.  She states her muscles have been aching.  She also has insomnia.  Her depression and anxiety is worse.  She has been tearful throughout the conversation.  Patient states she took her amitriptyline several years ago due to anxiety and depression.  Activities of Daily Living:  Patient reports morning stiffness for all day. Patient Denies nocturnal pain.  Difficulty dressing/grooming: Reports Difficulty climbing stairs: Reports Difficulty getting out of chair: Reports Difficulty using hands for taps, buttons, cutlery, and/or writing: Reports  Review of Systems  Constitutional:  Positive for fatigue.  HENT:  Positive for mouth dryness. Negative for mouth sores and nose dryness.   Eyes:  Positive for dryness. Negative for pain and itching.  Respiratory:  Negative for shortness of breath and difficulty breathing.   Cardiovascular:  Negative for chest pain and palpitations.  Gastrointestinal:  Negative for blood in stool, constipation and diarrhea.  Endocrine: Negative for increased urination.  Genitourinary:  Positive for difficulty urinating.  Musculoskeletal:  Positive for joint pain, joint pain,  joint swelling, myalgias, morning stiffness, muscle tenderness and myalgias.  Skin:  Negative for color change, rash, redness and sensitivity to sunlight.  Allergic/Immunologic: Negative for susceptible to infections.  Neurological:  Positive for numbness and headaches. Negative for dizziness.  Hematological:  Positive for bruising/bleeding tendency. Negative for swollen glands.  Psychiatric/Behavioral:  Positive for depressed mood and sleep disturbance. The patient is nervous/anxious.     PMFS History:  Patient Active Problem List   Diagnosis Date Noted   Autoimmune disease (HCC) 09/26/2020   IBS (irritable bowel syndrome) 05/07/2019   Menstrual changes 05/07/2019   Fatigue 05/07/2019    Past Medical History:  Diagnosis Date   IBS (irritable bowel syndrome)     Family History  Problem Relation Age of Onset   Breast cancer Mother    Hypothyroidism Mother    Hypothyroidism Father    Healthy Brother    Rheum arthritis Paternal Aunt    Arthritis Maternal Grandmother    Osteoarthritis Paternal Aunt    Past Surgical History:  Procedure Laterality Date   HERNIA REPAIR     double   TONSILLECTOMY AND ADENOIDECTOMY     Social History   Social History Narrative   Not on file   Immunization History  Administered Date(s) Administered   Influenza,inj,Quad PF,6+ Mos 10/16/2020   Moderna Sars-Covid-2 Vaccination 02/09/2020, 03/08/2020, 11/01/2020   Tdap 10/16/2020     Objective: Vital Signs: BP 115/78 (BP Location: Left Arm, Patient Position: Sitting, Cuff  Size: Normal)   Pulse 71   Ht 5\' 5"  (1.651 m)   Wt 164 lb 6.4 oz (74.6 kg)   BMI 27.36 kg/m    Physical Exam Vitals and nursing note reviewed.  Constitutional:      Appearance: She is well-developed.  HENT:     Head: Normocephalic and atraumatic.  Eyes:     Conjunctiva/sclera: Conjunctivae normal.  Cardiovascular:     Rate and Rhythm: Normal rate and regular rhythm.     Heart sounds: Normal heart sounds.   Pulmonary:     Effort: Pulmonary effort is normal.     Breath sounds: Normal breath sounds.  Abdominal:     General: Bowel sounds are normal.     Palpations: Abdomen is soft.  Musculoskeletal:     Cervical back: Normal range of motion.  Lymphadenopathy:     Cervical: No cervical adenopathy.  Skin:    General: Skin is warm and dry.     Capillary Refill: Capillary refill takes less than 2 seconds.     Comments: Acne noted on her face.  Neurological:     Mental Status: She is alert and oriented to person, place, and time.  Psychiatric:        Behavior: Behavior normal.      Musculoskeletal Exam: C-spine thoracic and lumbar spine were in good range of motion.  Shoulder joints, elbow joints, right wrist joint, MCPs PIPs and DIPs with good range of motion.  She had tenderness over left lateral epicondyle region.  She also had some discomfort with extension of her left wrist.  Hip joints and knee joints in good range of motion.  There was no tenderness over ankles or MTPs.  She had positive tender points and hyperalgesia.  CDAI Exam: CDAI Score: -- Patient Global: --; Provider Global: -- Swollen: --; Tender: -- Joint Exam 06/09/2022   No joint exam has been documented for this visit   There is currently no information documented on the homunculus. Go to the Rheumatology activity and complete the homunculus joint exam.  Investigation: No additional findings.  Imaging: No results found.  Recent Labs: Lab Results  Component Value Date   WBC 4.3 02/24/2022   HGB 13.7 02/24/2022   PLT 228 02/24/2022   NA 140 02/24/2022   K 4.1 02/24/2022   CL 109 02/24/2022   CO2 25 02/24/2022   GLUCOSE 78 02/24/2022   BUN 7 02/24/2022   CREATININE 0.70 02/24/2022   BILITOT 0.5 02/24/2022   ALKPHOS 55 06/14/2020   AST 13 02/24/2022   ALT 11 02/24/2022   PROT 7.1 02/24/2022   ALBUMIN 4.6 06/14/2020   CALCIUM 9.1 02/24/2022   GFRAA 144 02/06/2021   02/25/2022 UA negative, complements  normal, dsDNA negative, sed rate 2  Speciality Comments: PLQ EYE EXAM: 02/19/2022 [facility] Follow up in 1 year  Procedures:  No procedures performed Allergies: Latex   Assessment / Plan:     Visit Diagnoses: Autoimmune disease (HCC)-+ANA,+RNP,+SSA, inflammatory arthritis: - Plaquenil 200 mg p.o. twice daily Monday to Friday restarted on January 2023. -She continues to have sicca symptoms, fatigue, arthralgias and gives history of intermittent swelling.  No synovitis was noted.  All autoimmune labs obtained in March were unremarkable.  I will obtain labs today.  Plan: Protein / creatinine ratio, urine, Sedimentation rate, C3 and C4, Anti-DNA antibody, double-stranded  High risk medication use - Plaquenil 200 mg 1 tablet p.o. twice daily Monday through Friday.  Last eye examination was on  February 19, 2022 at [facility]. - Plan: CBC with Differential/Platelet, COMPLETE METABOLIC PANEL WITH GFR today and then every 5 months.  Pain in both hands - X-rays and ultrasound were unremarkable in the past.  She continues to have pain and discomfort in her hands.  Some PIP and DIP thickening was noted.  She complains of discomfort with extension of her left wrist.  No synovitis was noted.  Pain in both feet-she complains of pain and discomfort in her bilateral feet.  No synovitis was noted.  Myofascial pain -she has generalized pain, hyperalgesia and positive tender points.  I will refer her to integrative therapies.  I also discussed use of Cymbalta for musculoskeletal pain.  She also has underlying depression.  She states her depression and anxiety is fairly well controlled.  She denies any history of suicidal ideation.  Indications side effects contraindications of Cymbalta were discussed.  I will start her on Cymbalta 30 mg p.o. daily to see if she gets any benefit in musculoskeletal pain.  I also advised her to discuss treatment of depression with her PCP.  Plan: Ambulatory referral to Physical  Therapy, DULoxetine (CYMBALTA) 30 MG capsule  Other fatigue - History of intermittent fatigue and brain fog.  Symptoms are worse with her menstrual cycle.  Anxiety and depression -she has history of anxiety and depression.  She was tearful throughout the conversation.  Plan: DULoxetine (CYMBALTA) 30 MG capsule  History of IBS-she gives history of intermittent IBS symptoms.  Seasonal allergies  Other acne-acne was noted on the examination today.  Family history of rheumatoid arthritis  Family history of gout  Orders: Orders Placed This Encounter  Procedures   Protein / creatinine ratio, urine   CBC with Differential/Platelet   COMPLETE METABOLIC PANEL WITH GFR   Sedimentation rate   C3 and C4   Anti-DNA antibody, double-stranded   Ambulatory referral to Physical Therapy   Meds ordered this encounter  Medications   DULoxetine (CYMBALTA) 30 MG capsule    Sig: Take 1 capsule (30 mg total) by mouth daily.    Dispense:  30 capsule    Refill:  2    Face-to-face time spent with patient was 30 minutes. Greater than 50% of time was spent in counseling and coordination of care.  Follow-Up Instructions: Return in about 2 months (around 08/10/2022) for Autoimmune disease.   Pollyann Savoy, MD  Note - This record has been created using [employer].  Chart creation errors have been sought, but may not always  have been located. Such creation errors do not reflect on  the standard of medical care.

## 2022-06-09 ENCOUNTER — Encounter: Payer: Self-pay | Admitting: Rheumatology

## 2022-06-09 ENCOUNTER — Ambulatory Visit (INDEPENDENT_AMBULATORY_CARE_PROVIDER_SITE_OTHER): Payer: Commercial Managed Care - HMO | Admitting: Rheumatology

## 2022-06-09 VITALS — BP 115/78 | HR 71 | Ht 65.0 in | Wt 164.4 lb

## 2022-06-09 DIAGNOSIS — Z79899 Other long term (current) drug therapy: Secondary | ICD-10-CM | POA: Diagnosis not present

## 2022-06-09 DIAGNOSIS — R590 Localized enlarged lymph nodes: Secondary | ICD-10-CM

## 2022-06-09 DIAGNOSIS — M359 Systemic involvement of connective tissue, unspecified: Secondary | ICD-10-CM | POA: Diagnosis not present

## 2022-06-09 DIAGNOSIS — J302 Other seasonal allergic rhinitis: Secondary | ICD-10-CM

## 2022-06-09 DIAGNOSIS — F419 Anxiety disorder, unspecified: Secondary | ICD-10-CM

## 2022-06-09 DIAGNOSIS — M79671 Pain in right foot: Secondary | ICD-10-CM | POA: Diagnosis not present

## 2022-06-09 DIAGNOSIS — Z8261 Family history of arthritis: Secondary | ICD-10-CM

## 2022-06-09 DIAGNOSIS — R5383 Other fatigue: Secondary | ICD-10-CM

## 2022-06-09 DIAGNOSIS — M79642 Pain in left hand: Secondary | ICD-10-CM

## 2022-06-09 DIAGNOSIS — M7918 Myalgia, other site: Secondary | ICD-10-CM

## 2022-06-09 DIAGNOSIS — Z8719 Personal history of other diseases of the digestive system: Secondary | ICD-10-CM

## 2022-06-09 DIAGNOSIS — L708 Other acne: Secondary | ICD-10-CM

## 2022-06-09 DIAGNOSIS — M79672 Pain in left foot: Secondary | ICD-10-CM

## 2022-06-09 DIAGNOSIS — M79641 Pain in right hand: Secondary | ICD-10-CM | POA: Diagnosis not present

## 2022-06-09 DIAGNOSIS — F32A Depression, unspecified: Secondary | ICD-10-CM

## 2022-06-09 DIAGNOSIS — Z8269 Family history of other diseases of the musculoskeletal system and connective tissue: Secondary | ICD-10-CM

## 2022-06-09 MED ORDER — DULOXETINE HCL 30 MG PO CPEP: 30.0000 mg | ORAL_CAPSULE | Freq: Every day | ORAL | 2 refills | Status: DC

## 2022-06-09 NOTE — Patient Instructions (Signed)
Vaccines You are taking a medication(s) that can suppress your immune system.  The following immunizations are recommended: Flu annually Covid-19  Td/Tdap (tetanus, diphtheria, pertussis) every 10 years Pneumonia (Prevnar 15 then Pneumovax 23 at least 1 year apart.  Alternatively, can take Prevnar 20 without needing additional dose) Shingrix: 2 doses from 4 weeks to 6 months apart  Please check with your PCP to make sure you are up to date.  

## 2022-06-09 NOTE — Addendum Note (Signed)
Addended by: Henriette Combs on: 06/09/2022 10:52 AM   Modules accepted: Orders

## 2022-06-10 LAB — CBC WITH DIFFERENTIAL/PLATELET
Absolute Monocytes: 441 cells/uL (ref 200–950)
Basophils Absolute: 59 cells/uL (ref 0–200)
Basophils Relative: 1.5 %
Eosinophils Absolute: 39 cells/uL (ref 15–500)
Eosinophils Relative: 1 %
HCT: 42.9 % (ref 35.0–45.0)
Hemoglobin: 14.3 g/dL (ref 11.7–15.5)
Lymphs Abs: 1096 cells/uL (ref 850–3900)
MCH: 30.9 pg (ref 27.0–33.0)
MCHC: 33.3 g/dL (ref 32.0–36.0)
MCV: 92.7 fL (ref 80.0–100.0)
MPV: 11.5 fL (ref 7.5–12.5)
Monocytes Relative: 11.3 %
Neutro Abs: 2266 cells/uL (ref 1500–7800)
Neutrophils Relative %: 58.1 %
Platelets: 242 10*3/uL (ref 140–400)
RBC: 4.63 10*6/uL (ref 3.80–5.10)
RDW: 12.2 % (ref 11.0–15.0)
Total Lymphocyte: 28.1 %
WBC: 3.9 10*3/uL (ref 3.8–10.8)

## 2022-06-10 LAB — COMPLETE METABOLIC PANEL WITH GFR
AG Ratio: 1.9 (calc) (ref 1.0–2.5)
ALT: 11 U/L (ref 6–29)
AST: 13 U/L (ref 10–30)
Albumin: 4.6 g/dL (ref 3.6–5.1)
Alkaline phosphatase (APISO): 39 U/L (ref 31–125)
BUN: 8 mg/dL (ref 7–25)
CO2: 24 mmol/L (ref 20–32)
Calcium: 9.2 mg/dL (ref 8.6–10.2)
Chloride: 108 mmol/L (ref 98–110)
Creat: 0.68 mg/dL (ref 0.50–0.96)
Globulin: 2.4 g/dL (calc) (ref 1.9–3.7)
Glucose, Bld: 90 mg/dL (ref 65–99)
Potassium: 4.1 mmol/L (ref 3.5–5.3)
Sodium: 139 mmol/L (ref 135–146)
Total Bilirubin: 0.6 mg/dL (ref 0.2–1.2)
Total Protein: 7 g/dL (ref 6.1–8.1)
eGFR: 122 mL/min/{1.73_m2} (ref >=60)

## 2022-06-10 LAB — SEDIMENTATION RATE: Sed Rate: 2 mm/h (ref 0–20)

## 2022-06-10 LAB — PROTEIN / CREATININE RATIO, URINE
Creatinine, Urine: 106 mg/dL (ref 20–275)
Protein/Creat Ratio: 75 mg/g creat (ref 24–184)
Protein/Creatinine Ratio: 0.075 mg/mg creat (ref 0.024–0.184)
Total Protein, Urine: 8 mg/dL (ref 5–24)

## 2022-06-10 LAB — C3 AND C4
C3 Complement: 113 mg/dL (ref 83–193)
C4 Complement: 18 mg/dL (ref 15–57)

## 2022-06-10 LAB — ANTI-DNA ANTIBODY, DOUBLE-STRANDED: ds DNA Ab: 2 IU/mL

## 2022-06-11 NOTE — Progress Notes (Signed)
CBC, CMP, urine protein creatinine ratio, sed rate, complements and double-stranded DNA are all within normal limits.

## 2022-08-05 NOTE — Progress Notes (Unsigned)
Office Visit Note  Patient: Joanne Wallace             Date of Birth: May 20, 1996           MRN: [id number]             PCP: Minette Brine, FNP Referring: Minette Brine, FNP Visit Date: 08/18/2022 Occupation: _0 @  Subjective:  Improving   History of Present Illness: Joanne Wallace is a 26 y.o. female with history of autoimmune disease and myofascial pain.  She is taking Plaquenil 200 mg 1 tablet by mouth twice daily Monday through Friday.  She is tolerating Plaquenil without any side effects.  Patient was last seen in the office on 06/09/2022 at which time she was started on low-dose Cymbalta 30 mg 1 capsule daily.  She has noticed about a 75 to 80% improvement in her symptoms since adding on Cymbalta.  She has been tolerating Cymbalta without any side effects.  She has noticed improvement in her depression, fatigue, and sleep at night.  She is also experiencing less myofascial pain.  Patient reports that she has also switched jobs and is working remotely which has improved her quality of life significantly.  Overall she feels so much better recently. She denies any joint swelling at this time.  She has not had any symptoms of Raynaud's.  She denies any shortness of breath or pleuritic chest pain.  She has not had any recent fevers or swollen lymph nodes.  She continues to have chronic sicca symptoms and has been using xylitol mouth rinse as well as eyedrops daily.  She denies any recent rashes or increased hair loss.  Activities of Daily Living:  Patient reports morning stiffness for 1 hour.   Patient Denies nocturnal pain.  Difficulty dressing/grooming: Denies Difficulty climbing stairs: Denies Difficulty getting out of chair: Denies Difficulty using hands for taps, buttons, cutlery, and/or writing: Reports  Review of Systems  Constitutional:  Positive for fatigue.  HENT:  Positive for mouth sores and mouth dryness.   Eyes:  Positive for dryness.  Respiratory:  Negative for  shortness of breath.   Cardiovascular:  Negative for chest pain and palpitations.  Gastrointestinal:  Negative for blood in stool, constipation and diarrhea.  Endocrine: Positive for increased urination.  Genitourinary:  Negative for involuntary urination.  Musculoskeletal:  Positive for morning stiffness. Negative for joint pain, gait problem, joint pain, joint swelling, myalgias, muscle weakness, muscle tenderness and myalgias.  Skin:  Positive for hair loss. Negative for color change, rash and sensitivity to sunlight.  Allergic/Immunologic: Negative for susceptible to infections.  Neurological:  Positive for light-headedness and headaches. Negative for dizziness.  Hematological:  Negative for swollen glands.  Psychiatric/Behavioral:  Positive for depressed mood. Negative for sleep disturbance. The patient is nervous/anxious.     PMFS History:  Patient Active Problem List   Diagnosis Date Noted  . Autoimmune disease ([place]) 09/26/2020  . IBS (irritable bowel syndrome) 05/07/2019  . Menstrual changes 05/07/2019  . Fatigue 05/07/2019    Past Medical History:  Diagnosis Date  . IBS (irritable bowel syndrome)     Family History  Problem Relation Age of Onset  . Breast cancer Mother   . Hypothyroidism Mother   . Hypothyroidism Father   . Healthy Brother   . Rheum arthritis Paternal Aunt   . Arthritis Maternal Grandmother   . Osteoarthritis Paternal Aunt    Past Surgical History:  Procedure Laterality Date  . HERNIA REPAIR     double  .  TONSILLECTOMY AND ADENOIDECTOMY     Social History   Social History Narrative  . Not on file   Immunization History  Administered Date(s) Administered  . Influenza,inj,Quad PF,6+ Mos 10/16/2020, 08/11/2022  . Moderna Sars-Covid-2 Vaccination 02/09/2020, 03/08/2020, 11/01/2020  . Tdap 10/16/2020     Objective: Vital Signs: BP 129/80 (BP Location: Left Arm, Patient Position: Sitting, Cuff Size: Normal)   Pulse 98   Resp 15   Ht _0   (1.651 m)   Wt 163 lb 6.4 oz (74.1 kg)   LMP 07/25/2022 (Exact Date)   BMI 27.19 kg/m    Physical Exam Vitals and nursing note reviewed.  Constitutional:      Appearance: She is well-developed.  HENT:     Head: Normocephalic and atraumatic.  Eyes:     Conjunctiva/sclera: Conjunctivae normal.  Cardiovascular:     Rate and Rhythm: Normal rate and regular rhythm.     Heart sounds: Normal heart sounds.  Pulmonary:     Effort: Pulmonary effort is normal.     Breath sounds: Normal breath sounds.  Abdominal:     General: Bowel sounds are normal.     Palpations: Abdomen is soft.  Musculoskeletal:     Cervical back: Normal range of motion.  Skin:    General: Skin is warm and dry.     Capillary Refill: Capillary refill takes less than 2 seconds.  Neurological:     Mental Status: She is alert and oriented to person, place, and time.  Psychiatric:        Behavior: Behavior normal.     Musculoskeletal Exam: C-spine has good range of motion.  Some trapezius muscle tension tenderness bilaterally.  Tenderness over both SI joints.  No midline spinal tenderness noted.  Tenderness over bilateral trochanteric bursa.  Shoulder joints, elbow joints, wrist joints, MCPs, PIPs, DIPs have good range of motion with no synovitis.  Complete fist formation bilaterally.  Hip joints have good range of motion with no groin pain.  Knee joints have good range of motion with no warmth or effusion.  Ankle joints have good range of motion with no tenderness or joint swelling.  No tenderness or synovitis of MTP joints.   CDAI Exam: CDAI Score: -- Patient Global: --; Provider Global: -- Swollen: --; Tender: -- Joint Exam 08/18/2022   No joint exam has been documented for this visit   There is currently no information documented on the homunculus. Go to the Rheumatology activity and complete the homunculus joint exam.  Investigation: No additional findings.  Imaging: No results found.  Recent Labs: Lab  Results  Component Value Date   WBC 3.9 06/09/2022   HGB 14.3 06/09/2022   PLT 242 06/09/2022   NA 139 06/09/2022   K 4.1 06/09/2022   CL 108 06/09/2022   CO2 24 06/09/2022   GLUCOSE 90 06/09/2022   BUN 8 06/09/2022   CREATININE 0.68 06/09/2022   BILITOT 0.6 06/09/2022   ALKPHOS 55 06/14/2020   AST 13 06/09/2022   ALT 11 06/09/2022   PROT 7.0 06/09/2022   ALBUMIN 4.6 06/14/2020   CALCIUM 9.2 06/09/2022   GFRAA 144 02/06/2021    Speciality Comments: PLQ EYE EXAM: 02/19/2022 [facility] Follow up in 1 year  Procedures:  No procedures performed Allergies: Latex      Assessment / Plan:     Visit Diagnoses: Autoimmune disease (HCC)-+ANA,+RNP,+SSA, inflammatory arthritis: She has not had any signs or symptoms of a flare.  She has clinically been  doing well taking Plaquenil 200 mg 1 tablet by mouth twice daily Monday through Friday.  She continues to tolerate Plaquenil without any side effects.  At her last office visit on 06/09/2022 the plan was for her to initiate Cymbalta 30 mg 1 capsule daily.  She has been tolerating Cymbalta without any side effects.  She has noticed about a 75 to 85% improvement in her myofascial pain, fatigue, and depression since initiating therapy.  Her energy level has improved and she has been sleeping better at night.  Autoimmune lab work updated on 06/09/2022 was reviewed today in the office: Double-stranded DNA negative, complements within normal limits, CRP ESR within normal limits, protein creatinine ratio within normal limits, CBC within normal limits, and CMP within normal limits.  Labs are not consistent with active disease or a flare at this time.  She will remain on the current dose of Plaquenil as prescribed.  Her next lab work will be due in December and every 5 months to monitor for drug toxicity.  She will follow-up in the office in 3 months or sooner if needed.  High risk medication use - Plaquenil 200 mg 1 tablet p.o. twice daily Monday  through Friday.  She is tolerating Plaquenil without any side effects.  A refill of Plaquenil was sent to the pharmacy today. PLQ EYE EXAM: 02/19/2022 [facility] Follow up in 1 year.  CBC and CMP within normal limits on 06/09/2022.  Her next lab work will be due in December and every 5 months to monitor for drug toxicity.  Pain in both hands - X-rays and ultrasound were unremarkable in the past.  No synovitis noted on ultrasound from 09/26/2020.  She had no synovitis on examination today.  Complete fist formation noted bilaterally.  Pain in both feet: No tenderness or synovitis of MTP joints.  Ankle joints have good ROM with no tenderness or joint swelling.   Myofascial pain: She has noticed about a 75 to 85% improvement in her myofascial pain, fatigue, depression since initiating Cymbalta after her last office visit on 06/09/2022.  She has been tolerating Cymbalta without any side effects.  Overall she has felt more like herself and feels like her disease is significantly better controlled.   Discussed the importance of regular exercise, good sleep hygiene, and stress management.   Other fatigue - History of intermittent fatigue and brain fog.  Symptoms are worse with her menstrual cycle.  Overall her energy level has improved significantly.  She has been sleeping better at night since initiating Cymbalta 30 mg 1 capsule daily.  Discussed the importance of regular exercise and good sleep hygiene.  She will remain on Cymbalta and Plaquenil as prescribed.  Anxiety and depression: She has noticed a significant improvement in her mood especially her depression since initiating Cymbalta.  She will remain on Cymbalta 30 mg 1 capsule daily.  Other medical conditions are listed as follows:  Seasonal allergies  Other acne  History of IBS  Family history of rheumatoid arthritis  Family history of gout  Orders: No orders of the defined types were placed in this encounter.  Meds ordered this  encounter  Medications  . hydroxychloroquine (PLAQUENIL) 200 MG tablet    Sig: TAKE 1 TABLET BY MOUTH TWICE DAILY MONDAY THROUGH FRIDAY ONLY.    Dispense:  120 tablet    Refill:  0     Follow-Up Instructions: Return in about 3 months (around 11/17/2022) for Autoimmune Disease, Myofascial pain .   Joanne Le  Haskel Schroeder, PA-C  Note - This record has been created using [employer].  Chart creation errors have been sought, but may not always  have been located. Such creation errors do not reflect on  the standard of medical care.

## 2022-08-11 ENCOUNTER — Ambulatory Visit (INDEPENDENT_AMBULATORY_CARE_PROVIDER_SITE_OTHER): Payer: Commercial Managed Care - HMO | Admitting: Nurse Practitioner

## 2022-08-11 ENCOUNTER — Encounter: Payer: Self-pay | Admitting: Nurse Practitioner

## 2022-08-11 VITALS — BP 110/60 | HR 87 | Temp 98.9°F | Ht 65.0 in | Wt 163.0 lb

## 2022-08-11 DIAGNOSIS — M791 Myalgia, unspecified site: Secondary | ICD-10-CM

## 2022-08-11 DIAGNOSIS — Z23 Encounter for immunization: Secondary | ICD-10-CM | POA: Diagnosis not present

## 2022-08-11 DIAGNOSIS — N939 Abnormal uterine and vaginal bleeding, unspecified: Secondary | ICD-10-CM | POA: Diagnosis not present

## 2022-08-11 LAB — POCT URINE PREGNANCY: Preg Test, Ur: NEGATIVE

## 2022-08-11 NOTE — Progress Notes (Signed)
I,[name],acting as a Neurosurgeon for [name], FNP.,have documented all relevant documentation on the behalf of [name], FNP,as directed by  [name], FNP while in the presence of [name], FNP.   Subjective:     Patient ID: Joanne Wallace , female    DOB: 1996/08/15 , 26 y.o.   MRN: [id number]   Chief Complaint  Patient presents with   Vaginal Bleeding    HPI  Patient presents today for having vaginal bleeding a week before ovulation. Patient states it first started 6 months ago once, but more consistent in the last 3 months she states she noticed it more after having a bowel movement only when ovulating.  She is not currently on birth control. She has had a full menstrual cycle.  She feels an anxiety when having reproductive issues. LMP - 07/25/22  Patient states she recently had to put her dog down and is having a little sadness.   Vaginal Bleeding The patient's pertinent negatives include no pelvic pain, vaginal bleeding or vaginal discharge. Primary symptoms comment: vaginal spotting. This is a recurrent problem. The current episode started more than 1 month ago. The problem occurs intermittently. She is not pregnant. Pertinent negatives include no chills, constipation or painful intercourse. The symptoms are aggravated by bowel movements. She is sexually active. No, her partner does not have an STD. She uses nothing for contraception. Her menstrual history has been regular.     Past Medical History:  Diagnosis Date   IBS (irritable bowel syndrome)      Family History  Problem Relation Age of Onset   Breast cancer Mother    Hypothyroidism Mother    Hypothyroidism Father    Healthy Brother    Rheum arthritis Paternal Aunt    Arthritis Maternal Grandmother    Osteoarthritis Paternal Aunt      Current Outpatient Medications:    DULoxetine (CYMBALTA) 30 MG capsule, Take 1 capsule (30 mg total) by mouth daily., Disp: 30 capsule, Rfl: 2   hydroxychloroquine  (PLAQUENIL) 200 MG tablet, TAKE 1 TABLET BY MOUTH TWICE DAILY MONDAY THROUGH FRIDAY ONLY., Disp: 40 tablet, Rfl: 2   Multiple Vitamin (MULTIVITAMIN WITH MINERALS) TABS tablet, Take 1 tablet by mouth daily., Disp: , Rfl:    levocetirizine (XYZAL) 5 MG tablet, Take 5 mg by mouth as needed. (Patient not taking: Reported on 08/11/2022), Disp: , Rfl:    Allergies  Allergen Reactions   Latex Itching     Review of Systems  Constitutional: Negative.  Negative for chills.  HENT: Negative.    Eyes: Negative.   Respiratory: Negative.    Cardiovascular: Negative.   Gastrointestinal: Negative.  Negative for constipation.  Genitourinary:  Positive for vaginal bleeding. Negative for pelvic pain and vaginal discharge.  Psychiatric/Behavioral: Negative.       Today's Vitals   08/11/22 1444  BP: 110/60  Pulse: 87  Temp: 98.9 F (37.2 C)  TempSrc: Oral  Weight: 163 lb (73.9 kg)  Height: 5\' 5"  (1.651 m)  PainSc: 0-No pain   Body mass index is 27.12 kg/m.   Objective:  Physical Exam Vitals reviewed.  Constitutional:      General: She is not in acute distress.    Appearance: Normal appearance. She is well-developed.  HENT:     Right Ear: No tenderness.     Left Ear: No tenderness.     Nose: No congestion.     Mouth/Throat:     Mouth: Mucous membranes are moist.  Pharynx: No pharyngeal swelling.  Cardiovascular:     Rate and Rhythm: Normal rate and regular rhythm.     Pulses: Normal pulses.     Heart sounds: Normal heart sounds. No murmur heard. Pulmonary:     Effort: Pulmonary effort is normal. No respiratory distress.     Breath sounds: Normal breath sounds. No wheezing.  Abdominal:     General: Abdomen is flat. Bowel sounds are normal. There is no distension.     Palpations: Abdomen is soft. There is no mass.     Tenderness: There is no abdominal tenderness.  Musculoskeletal:     Cervical back: Normal range of motion and neck supple.  Skin:    General: Skin is warm.      Capillary Refill: Capillary refill takes less than 2 seconds.  Neurological:     General: No focal deficit present.     Mental Status: She is alert and oriented to person, place, and time.     Cranial Nerves: No cranial nerve deficit.     Motor: No weakness.  Psychiatric:        Mood and Affect: Mood normal.        Behavior: Behavior normal.        Thought Content: Thought content normal.        Judgment: Judgment normal.         Assessment And Plan:     1. Vaginal spotting Comments: Has been intermittent, will check urine pregnancy, TSH and refer to Gyn for further evaluation. - Ambulatory referral to Gynecology - TSH - POCT Urine Pregnancy  2. Myalgia Comments: Currently on Cymbalta with relief   3. Need for influenza vaccination Influenza vaccine administered Encouraged to take Tylenol as needed for fever or muscle aches. - Flu Vaccine QUAD 6+ mos PF IM (Fluarix Quad PF)   Patient was given opportunity to ask questions. Patient verbalized understanding of the plan and was able to repeat key elements of the plan. All questions were answered to their satisfaction.  [name], FNP    I, [name], FNP, have reviewed all documentation for this visit. The documentation on 08/11/22 for the exam, diagnosis, procedures, and orders are all accurate and complete.  IF YOU HAVE BEEN REFERRED TO A SPECIALIST, IT MAY TAKE 1-2 WEEKS TO SCHEDULE/PROCESS THE REFERRAL. IF YOU HAVE NOT HEARD FROM US/SPECIALIST IN TWO WEEKS, PLEASE GIVE [place] A CALL AT [phone].   THE PATIENT IS ENCOURAGED TO PRACTICE SOCIAL DISTANCING DUE TO THE COVID-19 PANDEMIC.

## 2022-08-11 NOTE — Patient Instructions (Signed)

## 2022-08-12 LAB — TSH: TSH: 0.882 u[IU]/mL (ref 0.450–4.500)

## 2022-08-18 ENCOUNTER — Ambulatory Visit: Payer: Commercial Managed Care - HMO | Attending: Physician Assistant | Admitting: Physician Assistant

## 2022-08-18 ENCOUNTER — Encounter: Payer: Self-pay | Admitting: Physician Assistant

## 2022-08-18 VITALS — BP 129/80 | HR 98 | Resp 15 | Ht 65.0 in | Wt 163.4 lb

## 2022-08-18 DIAGNOSIS — R5383 Other fatigue: Secondary | ICD-10-CM

## 2022-08-18 DIAGNOSIS — F32A Depression, unspecified: Secondary | ICD-10-CM

## 2022-08-18 DIAGNOSIS — Z8269 Family history of other diseases of the musculoskeletal system and connective tissue: Secondary | ICD-10-CM

## 2022-08-18 DIAGNOSIS — F419 Anxiety disorder, unspecified: Secondary | ICD-10-CM

## 2022-08-18 DIAGNOSIS — M79671 Pain in right foot: Secondary | ICD-10-CM | POA: Diagnosis not present

## 2022-08-18 DIAGNOSIS — M79642 Pain in left hand: Secondary | ICD-10-CM

## 2022-08-18 DIAGNOSIS — Z8261 Family history of arthritis: Secondary | ICD-10-CM

## 2022-08-18 DIAGNOSIS — J302 Other seasonal allergic rhinitis: Secondary | ICD-10-CM

## 2022-08-18 DIAGNOSIS — M359 Systemic involvement of connective tissue, unspecified: Secondary | ICD-10-CM

## 2022-08-18 DIAGNOSIS — Z8719 Personal history of other diseases of the digestive system: Secondary | ICD-10-CM

## 2022-08-18 DIAGNOSIS — L708 Other acne: Secondary | ICD-10-CM

## 2022-08-18 DIAGNOSIS — M79641 Pain in right hand: Secondary | ICD-10-CM | POA: Diagnosis not present

## 2022-08-18 DIAGNOSIS — Z79899 Other long term (current) drug therapy: Secondary | ICD-10-CM | POA: Diagnosis not present

## 2022-08-18 DIAGNOSIS — M7918 Myalgia, other site: Secondary | ICD-10-CM

## 2022-08-18 DIAGNOSIS — M79672 Pain in left foot: Secondary | ICD-10-CM

## 2022-08-18 MED ORDER — HYDROXYCHLOROQUINE SULFATE 200 MG PO TABS: ORAL_TABLET | ORAL | 0 refills | Status: DC

## 2022-08-18 NOTE — Patient Instructions (Signed)
Standing Labs We placed an order today for your standing lab work.   Please have your standing labs drawn in December and every 5 months   If possible, please have your labs drawn 2 weeks prior to your appointment so that the provider can discuss your results at your appointment.  Please note that you may see your imaging and lab results in [facility] before we have reviewed them. We may be awaiting multiple results to interpret others before contacting you. Please allow our office up to 72 hours to thoroughly review all of the results before contacting the office for clarification of your results.  We currently have open lab daily: Monday through Thursday from 1:30 PM-4:30 PM and Friday from 1:30 PM- 4:00 PM If possible, please come for your lab work on Monday, Thursday or Friday afternoons, as you may experience shorter wait times.   Effective October 01, 2022 the new lab hours will change to: Monday through Thursday from 1:30 PM-5:00 PM and Friday from 8:30 AM-12:00 PM If possible, please come for your lab work on Monday and Thursday afternoons, as you may experience shorter wait times.  Please be advised, all patients with office appointments requiring lab work will take precedent over walk-in lab work.    The office is located at [address] [place], [place], [place] [address] No appointment is necessary.   Labs are drawn by [facility]. Please bring your co-pay at the time of your lab draw.  You may receive a bill from [facility] for your lab work.  Please note if you are on Hydroxychloroquine and and an order has been placed for a Hydroxychloroquine level, you will need to have it drawn 4 hours or more after your last dose.  If you wish to have your labs drawn at another location, please call the office 24 hours in advance to send orders.  If you have any questions regarding directions or hours of operation,  please call [phone].   As a reminder, please drink plenty of water prior  to coming for your lab work. Thanks!

## 2022-08-21 ENCOUNTER — Other Ambulatory Visit: Payer: Self-pay | Admitting: Rheumatology

## 2022-08-21 DIAGNOSIS — M7918 Myalgia, other site: Secondary | ICD-10-CM

## 2022-08-21 DIAGNOSIS — F32A Depression, unspecified: Secondary | ICD-10-CM

## 2022-08-21 NOTE — Telephone Encounter (Signed)
Next Visit: 11/17/2022  Last Visit: 08/18/2022  Last Fill: 06/09/2022  Dx: Myofascial pain  Current Dose per office note on 08/18/2022: Cymbalta 30 mg 1 capsule daily.  Okay to refill Cymbalta?

## 2022-10-09 NOTE — Telephone Encounter (Signed)
Will be happy to discuss at her visit. Can not advise before she is seen, happy to move her appt up to next week if she'd like.

## 2022-10-09 NOTE — Telephone Encounter (Signed)
Appointment can patient appointment be moved up?

## 2022-10-10 ENCOUNTER — Ambulatory Visit (INDEPENDENT_AMBULATORY_CARE_PROVIDER_SITE_OTHER): Payer: Commercial Managed Care - HMO | Admitting: Radiology

## 2022-10-10 ENCOUNTER — Encounter: Payer: Self-pay | Admitting: Radiology

## 2022-10-10 VITALS — BP 114/70 | Ht 65.5 in | Wt 165.0 lb

## 2022-10-10 DIAGNOSIS — N940 Mittelschmerz: Secondary | ICD-10-CM

## 2022-10-10 DIAGNOSIS — N760 Acute vaginitis: Secondary | ICD-10-CM

## 2022-10-10 DIAGNOSIS — B9689 Other specified bacterial agents as the cause of diseases classified elsewhere: Secondary | ICD-10-CM

## 2022-10-10 DIAGNOSIS — Z113 Encounter for screening for infections with a predominantly sexual mode of transmission: Secondary | ICD-10-CM | POA: Diagnosis not present

## 2022-10-10 DIAGNOSIS — N3090 Cystitis, unspecified without hematuria: Secondary | ICD-10-CM | POA: Diagnosis not present

## 2022-10-10 MED ORDER — SULFAMETHOXAZOLE-TRIMETHOPRIM 800-160 MG PO TABS: 1.0000 | ORAL_TABLET | Freq: Two times a day (BID) | ORAL | 0 refills | Status: DC

## 2022-10-10 MED ORDER — METRONIDAZOLE 500 MG PO TABS: 500.0000 mg | ORAL_TABLET | Freq: Two times a day (BID) | ORAL | 0 refills | Status: DC

## 2022-10-10 NOTE — Progress Notes (Signed)
   Joanne Wallace 1996/06/20 [id number]   History:  26 y.o. G0 presents for new patient visit. Complains of mid-cycle discomfort and spotting. Symptoms became consistent mid-cycle since 07/2022. Complains of painful, heavy periods and "reproductive anxiety."        Gynecologic History Patient's last menstrual period was 09/23/2022 (exact date). Period Cycle (Days): 30 Period Duration (Days): 6 Period Pattern: Regular Menstrual Flow: Heavy (very heavy first 1-3 days) Menstrual Control: Maxi pad (period underwear) Dysmenorrhea: (!) Severe (1st day) Dysmenorrhea Symptoms: Cramping Contraception/Family planning: condoms Sexually active: yes Last Pap: 11/21. Results were: normal   Obstetric History OB History  Gravida Para Term Preterm AB Living  0 0 0 0 0 0  SAB IAB Ectopic Multiple Live Births  0 0 0 0 0     The following portions of the patient's history were reviewed and updated as appropriate: allergies, current medications, past family history, past medical history, past social history, past surgical history, and problem list.  Review of Systems Pertinent items noted in HPI and remainder of comprehensive ROS otherwise negative.   Past medical history, past surgical history, family history and social history were all reviewed and documented in the EPIC chart.   Exam:  Vitals:   10/10/22 0916  BP: 114/70  Weight: 165 lb (74.8 kg)  Height: 5' 5.5" (1.664 m)   Body mass index is 27.04 kg/m.  General appearance:  Normal Abdominal  Soft,nontender, without masses, guarding or rebound.  Liver/spleen:  No organomegaly noted  Hernia:  None appreciated  Skin  Inspection:  Grossly normal Genitourinary   Inguinal/mons:  Normal without inguinal adenopathy  External genitalia:  Normal appearing vulva with no masses, tenderness, or lesions  BUS/Urethra/Skene's glands:  Normal without masses or exudate  Vagina:  Normal appearing with normal color and discharge, no  lesions  Cervix:  Normal appearing without discharge or lesions  Uterus:  Normal in size, shape and contour.  Mobile, nontender  Adnexa/parametria:     Rt: Normal in size, without masses or tenderness.   Lt: Normal in size, without masses or tenderness.  Anus and perineum: Normal   Chaperone declined  Urine dipstick shows positive for RBC's, positive for protein, and positive for leukocytes.  Micro exam: 6-10 WBC's per HPF, 3-10 RBC's per HPF, and moderate+ bacteria, +clue cells  Assessment/Plan:   1. Mid cycle pain - Pregnancy, urine - Urinalysis,Complete w/RFL Culture  2. Cystitis with hematuria - sulfamethoxazole-trimethoprim (BACTRIM DS) 800-160 MG tablet; Take 1 tablet by mouth 2 (two) times daily.  Dispense: 6 tablet; Refill: 0  3. Screening for STDs (sexually transmitted diseases) - SureSwab Advanced Vaginitis Plus,TMA  4. BV (bacterial vaginosis) - metroNIDAZOLE (FLAGYL) 500 MG tablet; Take 1 tablet (500 mg total) by mouth 2 (two) times daily.  Dispense: 14 tablet; Refill: 0  5. [place] NSAIDS before ovulation, will consider OCPs to prevent ovulation    AEX 3 months  Joanne Wallace, Clearnce Hasten B WHNP-BC 9:40 AM 10/10/2022

## 2022-10-11 LAB — URINALYSIS, COMPLETE W/RFL CULTURE
Bilirubin Urine: NEGATIVE
Glucose, UA: NEGATIVE
Hyaline Cast: NONE SEEN /LPF
Ketones, ur: NEGATIVE
Nitrites, Initial: NEGATIVE
Specific Gravity, Urine: 1.015 (ref 1.001–1.035)
pH: 8.5 — ABNORMAL HIGH (ref 5.0–8.0)

## 2022-10-11 LAB — URINE CULTURE
MICRO NUMBER:: 14172905
SPECIMEN QUALITY:: ADEQUATE

## 2022-10-11 LAB — PREGNANCY, URINE: Preg Test, Ur: NEGATIVE

## 2022-10-11 LAB — SURESWAB® ADVANCED VAGINITIS PLUS,TMA
C. trachomatis RNA, TMA: NOT DETECTED
CANDIDA SPECIES: NOT DETECTED
Candida glabrata: NOT DETECTED
N. gonorrhoeae RNA, TMA: NOT DETECTED
SURESWAB(R) ADV BACTERIAL VAGINOSIS(BV),TMA: POSITIVE — AB
TRICHOMONAS VAGINALIS (TV),TMA: NOT DETECTED

## 2022-10-11 LAB — CULTURE INDICATED

## 2022-10-20 ENCOUNTER — Encounter: Payer: Commercial Managed Care - HMO | Admitting: Radiology

## 2022-10-30 ENCOUNTER — Encounter: Payer: Self-pay | Admitting: Internal Medicine

## 2022-10-30 ENCOUNTER — Ambulatory Visit (INDEPENDENT_AMBULATORY_CARE_PROVIDER_SITE_OTHER): Payer: Commercial Managed Care - HMO | Admitting: Internal Medicine

## 2022-10-30 VITALS — BP 120/80 | HR 93 | Temp 98.4°F | Ht 66.0 in | Wt 164.5 lb

## 2022-10-30 DIAGNOSIS — M359 Systemic involvement of connective tissue, unspecified: Secondary | ICD-10-CM

## 2022-10-30 DIAGNOSIS — R5383 Other fatigue: Secondary | ICD-10-CM

## 2022-10-30 LAB — URINALYSIS, ROUTINE W REFLEX MICROSCOPIC
Hgb urine dipstick: NEGATIVE
Ketones, ur: NEGATIVE
Leukocytes,Ua: NEGATIVE
Nitrite: NEGATIVE
RBC / HPF: NONE SEEN (ref 0–?)
Specific Gravity, Urine: >=1.03 — AB (ref 1.000–1.030)
Urine Glucose: NEGATIVE
Urobilinogen, UA: 0.2 (ref 0.0–1.0)
pH: 7 (ref 5.0–8.0)

## 2022-10-30 LAB — VITAMIN D 25 HYDROXY (VIT D DEFICIENCY, FRACTURES): VITD: 28.13 ng/mL — ABNORMAL LOW (ref 30.00–100.00)

## 2022-10-30 LAB — VITAMIN B12: Vitamin B-12: 293 pg/mL (ref 211–911)

## 2022-10-30 LAB — FOLATE: Folate: 16.3 ng/mL (ref >5.9)

## 2022-10-30 NOTE — Progress Notes (Signed)
   Subjective:   Patient ID: Joanne Wallace, female    DOB: 11-14-1996, 26 y.o.   MRN: [id number]  HPI The patient is a new 26 YO coming in for concerns and ongoing care.  PMH, [facility], social history reviewed and updated  Review of Systems  Constitutional:  Positive for fatigue.  HENT: Negative.    Eyes: Negative.   Respiratory:  Negative for cough, chest tightness and shortness of breath.   Cardiovascular:  Negative for chest pain, palpitations and leg swelling.  Gastrointestinal:  Negative for abdominal distention, abdominal pain, constipation, diarrhea, nausea and vomiting.  Musculoskeletal:  Positive for arthralgias and myalgias.  Skin: Negative.   Neurological: Negative.   Psychiatric/Behavioral: Negative.      Objective:  Physical Exam Constitutional:      Appearance: She is well-developed.  HENT:     Head: Normocephalic and atraumatic.  Cardiovascular:     Rate and Rhythm: Normal rate and regular rhythm.  Pulmonary:     Effort: Pulmonary effort is normal. No respiratory distress.     Breath sounds: Normal breath sounds. No wheezing or rales.  Abdominal:     General: Bowel sounds are normal. There is no distension.     Palpations: Abdomen is soft.     Tenderness: There is no abdominal tenderness. There is no rebound.  Musculoskeletal:     Cervical back: Normal range of motion.  Skin:    General: Skin is warm and dry.  Neurological:     Mental Status: She is alert and oriented to person, place, and time.     Coordination: Coordination normal.     Vitals:   10/30/22 0800  BP: 120/80  Pulse: 93  Temp: 98.4 F (36.9 C)  TempSrc: Oral  SpO2: 99%  Weight: 164 lb 8 oz (74.6 kg)  Height: 5\' 6"  (1.676 m)    Assessment & Plan:

## 2022-10-30 NOTE — Patient Instructions (Signed)
Make sure to keep walking to help at least 3 times a week.

## 2022-10-31 NOTE — Assessment & Plan Note (Signed)
Is under treatment by rheumatology with plaquenil and up to date on labs and yearly eye exam. Overall control is good and much improved by adding duloxetine 30 mg daily.

## 2022-10-31 NOTE — Assessment & Plan Note (Signed)
Recent UTI will check U/A for resolution. She is vegetarian so will check vitamin levels including B12, B1, vitamin D, folate, zinc. Adjust as needed.

## 2022-11-04 NOTE — Progress Notes (Signed)
Office Visit Note  Patient: Joanne Wallace             Date of Birth: 1996-03-30           MRN: [id number]             PCP: Hoyt Koch, MD Referring: Minette Brine, FNP Visit Date: 11/17/2022 Occupation: _0 @  Subjective:  Medication monitoring   History of Present Illness: Samyukta Cura is a 26 y.o. female with history of autoimmune disease and osteoarthritis.  Patient is currently taking Plaquenil 200 mg 1 tablet by mouth twice daily Monday through Friday.  She is tolerating Plaquenil without any side effects.  She denies any signs or symptoms of an autoimmune disease flare since her last office visit.  She continues to experience arthralgias especially in her hands, knees, and feet.  She notices intermittent inflammation in her knees and wears braces at times to alleviate her symptoms.  She has been trying to exercise more frequently as encouraged.  She denies any symptoms of Raynaud's phenomenon.  She denies any recent hair loss or photosensitivity.  She denies any facial rashes.  She currently has 1 painless mouth sores but denies any nasal ulcers. She denies any new medical conditions.  She denies any recent infections.  She is tolerating cymbalta 30 mg 1 capsule by mouth daily.      Activities of Daily Living:  Patient reports morning stiffness for 1 hour.   Patient Reports nocturnal pain.  Difficulty dressing/grooming: Denies Difficulty climbing stairs: Denies Difficulty getting out of chair: Denies Difficulty using hands for taps, buttons, cutlery, and/or writing: Reports  Review of Systems  Constitutional:  Positive for fatigue.  HENT:  Positive for mouth sores and mouth dryness.   Eyes:  Positive for dryness.  Respiratory:  Negative for shortness of breath.   Cardiovascular:  Negative for chest pain and palpitations.  Gastrointestinal:  Negative for blood in stool, constipation and diarrhea.  Endocrine: Negative for increased urination.   Genitourinary:  Negative for involuntary urination.  Musculoskeletal:  Positive for joint pain, joint pain, joint swelling, myalgias, muscle weakness, morning stiffness, muscle tenderness and myalgias. Negative for gait problem.  Skin:  Negative for color change, rash, hair loss and sensitivity to sunlight.  Allergic/Immunologic: Negative for susceptible to infections.  Neurological:  Negative for dizziness and headaches.  Hematological:  Positive for swollen glands.  Psychiatric/Behavioral:  Positive for depressed mood. Negative for sleep disturbance. The patient is nervous/anxious.     PMFS History:  Patient Active Problem List   Diagnosis Date Noted   Autoimmune disease ([place]) 09/26/2020   Menstrual changes 05/07/2019   Fatigue 05/07/2019    Past Medical History:  Diagnosis Date   IBS (irritable bowel syndrome)    Mixed connective tissue disease ([place])     Family History  Problem Relation Age of Onset   Breast cancer Mother    Hypothyroidism Mother    Hypothyroidism Father    Healthy Brother    Rheum arthritis Paternal Aunt    Arthritis Maternal Grandmother    Osteoarthritis Paternal Aunt    Past Surgical History:  Procedure Laterality Date   HERNIA REPAIR     double   TONSILLECTOMY AND ADENOIDECTOMY     Social History   Social History Narrative   Not on file   Immunization History  Administered Date(s) Administered   Influenza,inj,Quad PF,6+ Mos 10/16/2020, 08/11/2022   Influenza-Unspecified 08/30/2021   Moderna Sars-Covid-2 Vaccination 02/09/2020, 03/08/2020, 11/01/2020   Tdap 10/16/2020  Objective: Vital Signs: BP 120/86 (BP Location: Left Arm, Patient Position: Sitting, Cuff Size: Normal)   Pulse 76   Resp 14   Ht _0  (1.676 m)   Wt 170 lb 3.2 oz (77.2 kg)   LMP 10/21/2022 (Exact Date)   BMI 27.47 kg/m    Physical Exam Vitals and nursing note reviewed.  Constitutional:      Appearance: She is well-developed.  HENT:     Head: Normocephalic  and atraumatic.  Eyes:     Conjunctiva/sclera: Conjunctivae normal.  Cardiovascular:     Rate and Rhythm: Normal rate and regular rhythm.     Heart sounds: Normal heart sounds.  Pulmonary:     Effort: Pulmonary effort is normal.     Breath sounds: Normal breath sounds.  Abdominal:     General: Bowel sounds are normal.     Palpations: Abdomen is soft.  Musculoskeletal:     Cervical back: Normal range of motion.  Skin:    General: Skin is warm and dry.     Capillary Refill: Capillary refill takes less than 2 seconds.  Neurological:     Mental Status: She is alert and oriented to person, place, and time.  Psychiatric:        Behavior: Behavior normal.      Musculoskeletal Exam: C-spine, thoracic spine, lumbar spine have good range of motion.  No midline spinal tenderness.  Shoulder joints, elbow joints, wrist joints, MCPs, PIPs, DIPs have good range of motion with no synovitis. Complete fist formation bilaterally. Hip joints have good range of motion with no groin pain.  Knee joints have good range of motion with no warmth or effusion.  Ankle joints have good range of motion with no tenderness or joint swelling.  CDAI Exam: CDAI Score: -- Patient Global: --; Provider Global: -- Swollen: --; Tender: -- Joint Exam 11/17/2022   No joint exam has been documented for this visit   There is currently no information documented on the homunculus. Go to the Rheumatology activity and complete the homunculus joint exam.  Investigation: No additional findings.  Imaging: No results found.  Recent Labs: Lab Results  Component Value Date   WBC 3.9 06/09/2022   HGB 14.3 06/09/2022   PLT 242 06/09/2022   NA 139 06/09/2022   K 4.1 06/09/2022   CL 108 06/09/2022   CO2 24 06/09/2022   GLUCOSE 90 06/09/2022   BUN 8 06/09/2022   CREATININE 0.68 06/09/2022   BILITOT 0.6 06/09/2022   ALKPHOS 55 06/14/2020   AST 13 06/09/2022   ALT 11 06/09/2022   PROT 7.0 06/09/2022   ALBUMIN 4.6  06/14/2020   CALCIUM 9.2 06/09/2022   GFRAA 144 02/06/2021    Speciality Comments: PLQ EYE EXAM: 02/19/2022 [facility] Follow up in 1 year  Procedures:  No procedures performed Allergies: Latex   Assessment / Plan:     Visit Diagnoses: Autoimmune disease (HCC)-+ANA,+RNP,+SSA, inflammatory arthritis:  She has not had any signs or symptoms of an autoimmune disease flare.  She has clinically been doing well taking Plaquenil 200 mg 1 tablet by mouth twice daily Monday through Friday.  She has been tolerating Plaquenil without any side effects and has not missed any doses recently.  She has no synovitis on examination today.  She has not had any symptoms of Raynaud's phenomenon.  No shortness of breath or pleuritic chest pain.  Her lungs were clear to auscultation.  She currently has 1 painless mouth sores but has  not had any nasal ulcers.  No increased hair loss or photosensitivity at this time.  She continues to have chronic sicca symptoms.   Lab work from 06/09/2022 was reviewed today in the office: Protein creatinine ratio within normal limits, double-stranded DNA negative, ESR within normal limits, complements within normal limits, CBC and CMP within normal limits. The following lab work will be updated today.  She will remain on Plaquenil as prescribed.  She was advised to notify us if she develops any new or worsening symptoms.  She will follow-up in the office in 5 months or sooner if needed. - Plan: CBC with Differential/Platelet, COMPLETE METABOLIC PANEL WITH GFR, Protein / creatinine ratio, urine, ANA, Anti-DNA antibody, double-stranded, Sedimentation rate, C3 and C4, RNP Antibody, Sjogrens syndrome-A extractable nuclear antibody  High risk medication use - Plaquenil 200 mg 1 tablet by mouth twice daily Monday through Friday.  CBC and CMP were within normal limits on 06/09/2022.  Orders for CBC and CMP were released today. PLQ EYE EXAM: 02/19/2022 [facility] Follow up in 1 year.   Advised patient to schedule an updated Plaquenil eye examination.  - Plan: CBC with Differential/Platelet, COMPLETE METABOLIC PANEL WITH GFR  Pain in both hands - X-rays and ultrasound were unremarkable in the past.  No synovitis noted on ultrasound from 09/26/2020.  She continues to experience intermittent pain in both hands but has no synovitis on examination today.  Pain in both feet: She experiences intermittent discomfort in her feet.  She has good range of motion of both ankle joints with no tenderness or synovitis.  Discussed the importance of wearing proper fitting shoes.  Myofascial pain: She experiences intermittent myalgias and muscle tenderness due to myofascial pain.  Overall her symptoms have been tolerable.  She remains on Cymbalta 30 mg 1 capsule daily.  She has been increasing her exercise regimen which has been helpful.  Discussed the importance of regular exercise and good sleep hygiene.  Other fatigue - History of intermittent fatigue and brain fog.  Symptoms are worse with her menstrual cycle.  Improved overall since working from home and starting on cymbalta.   Other medical conditions are listed as follows:   Anxiety and depression - She remains on Cymbalta 30 mg 1 capsule daily.  Other acne  Seasonal allergies  History of IBS  Family history of rheumatoid arthritis  Family history of gout  Orders: Orders Placed This Encounter  Procedures   CBC with Differential/Platelet   COMPLETE METABOLIC PANEL WITH GFR   Protein / creatinine ratio, urine   ANA   Anti-DNA antibody, double-stranded   Sedimentation rate   C3 and C4   RNP Antibody   Sjogrens syndrome-A extractable nuclear antibody   No orders of the defined types were placed in this encounter.     Follow-Up Instructions: Return in about 5 months (around 04/18/2023) for Autoimmune Disease, Osteoarthritis.   Ofilia Neas, PA-C  Note - This record has been created using Dragon software.  Chart  creation errors have been sought, but may not always  have been located. Such creation errors do not reflect on  the standard of medical care.

## 2022-11-05 LAB — ZINC: Zinc: 79 ug/dL (ref 60–130)

## 2022-11-05 LAB — VITAMIN B1: Vitamin B1 (Thiamine): 7 nmol/L — ABNORMAL LOW (ref 8–30)

## 2022-11-06 ENCOUNTER — Other Ambulatory Visit: Payer: Self-pay | Admitting: Internal Medicine

## 2022-11-06 MED ORDER — THIAMINE HCL 100 MG PO TABS: 100.0000 mg | ORAL_TABLET | Freq: Every day | ORAL | 3 refills | Status: AC

## 2022-11-10 ENCOUNTER — Other Ambulatory Visit: Payer: Self-pay | Admitting: Physician Assistant

## 2022-11-10 NOTE — Telephone Encounter (Signed)
Next Visit: 11/17/2022  Last Visit: 08/18/2022  Labs: 06/09/2022 CBC, CMP, urine protein creatinine ratio, sed rate, complements and double-stranded DNA are all within normal limits.   Eye exam: 02/19/2022   Current Dose per office note on 08/18/2022: Plaquenil 200 mg 1 tablet p.o. twice daily Monday through Friday.   BM:[id number] disease   Last Fill: 08/18/2022  Okay to refill Plaquenil?

## 2022-11-17 ENCOUNTER — Encounter: Payer: Self-pay | Admitting: Physician Assistant

## 2022-11-17 ENCOUNTER — Ambulatory Visit: Payer: Commercial Managed Care - HMO | Attending: Physician Assistant | Admitting: Physician Assistant

## 2022-11-17 VITALS — BP 120/86 | HR 76 | Resp 14 | Ht 66.0 in | Wt 170.2 lb

## 2022-11-17 DIAGNOSIS — M79672 Pain in left foot: Secondary | ICD-10-CM

## 2022-11-17 DIAGNOSIS — Z8719 Personal history of other diseases of the digestive system: Secondary | ICD-10-CM

## 2022-11-17 DIAGNOSIS — R5383 Other fatigue: Secondary | ICD-10-CM

## 2022-11-17 DIAGNOSIS — M359 Systemic involvement of connective tissue, unspecified: Secondary | ICD-10-CM | POA: Diagnosis not present

## 2022-11-17 DIAGNOSIS — Z8269 Family history of other diseases of the musculoskeletal system and connective tissue: Secondary | ICD-10-CM

## 2022-11-17 DIAGNOSIS — Z79899 Other long term (current) drug therapy: Secondary | ICD-10-CM | POA: Diagnosis not present

## 2022-11-17 DIAGNOSIS — F419 Anxiety disorder, unspecified: Secondary | ICD-10-CM

## 2022-11-17 DIAGNOSIS — L708 Other acne: Secondary | ICD-10-CM

## 2022-11-17 DIAGNOSIS — M7918 Myalgia, other site: Secondary | ICD-10-CM

## 2022-11-17 DIAGNOSIS — F32A Depression, unspecified: Secondary | ICD-10-CM

## 2022-11-17 DIAGNOSIS — M79641 Pain in right hand: Secondary | ICD-10-CM | POA: Diagnosis not present

## 2022-11-17 DIAGNOSIS — M79671 Pain in right foot: Secondary | ICD-10-CM

## 2022-11-17 DIAGNOSIS — M79642 Pain in left hand: Secondary | ICD-10-CM

## 2022-11-17 DIAGNOSIS — Z8261 Family history of arthritis: Secondary | ICD-10-CM

## 2022-11-17 DIAGNOSIS — J302 Other seasonal allergic rhinitis: Secondary | ICD-10-CM

## 2022-11-17 NOTE — Progress Notes (Signed)
CBC WNL. ESR WNL.

## 2022-11-18 NOTE — Progress Notes (Signed)
Protein creatinine ratio WNL.  CMP WNL.

## 2022-11-18 NOTE — Progress Notes (Signed)
Complements WNL

## 2022-11-19 LAB — COMPLETE METABOLIC PANEL WITH GFR
AG Ratio: 1.7 (calc) (ref 1.0–2.5)
ALT: 14 U/L (ref 6–29)
AST: 13 U/L (ref 10–30)
Albumin: 4.6 g/dL (ref 3.6–5.1)
Alkaline phosphatase (APISO): 41 U/L (ref 31–125)
BUN: 9 mg/dL (ref 7–25)
CO2: 23 mmol/L (ref 20–32)
Calcium: 9.1 mg/dL (ref 8.6–10.2)
Chloride: 108 mmol/L (ref 98–110)
Creat: 0.6 mg/dL (ref 0.50–0.96)
Globulin: 2.7 g/dL (calc) (ref 1.9–3.7)
Glucose, Bld: 84 mg/dL (ref 65–99)
Potassium: 4 mmol/L (ref 3.5–5.3)
Sodium: 140 mmol/L (ref 135–146)
Total Bilirubin: 0.3 mg/dL (ref 0.2–1.2)
Total Protein: 7.3 g/dL (ref 6.1–8.1)
eGFR: 125 mL/min/{1.73_m2} (ref >=60)

## 2022-11-19 LAB — ANA: Anti Nuclear Antibody (ANA): POSITIVE — AB

## 2022-11-19 LAB — CBC WITH DIFFERENTIAL/PLATELET
Absolute Monocytes: 377 cells/uL (ref 200–950)
Basophils Absolute: 82 cells/uL (ref 0–200)
Basophils Relative: 1.6 %
Eosinophils Absolute: 102 cells/uL (ref 15–500)
Eosinophils Relative: 2 %
HCT: 41.7 % (ref 35.0–45.0)
Hemoglobin: 14.3 g/dL (ref 11.7–15.5)
Lymphs Abs: 1556 cells/uL (ref 850–3900)
MCH: 30.9 pg (ref 27.0–33.0)
MCHC: 34.3 g/dL (ref 32.0–36.0)
MCV: 90.1 fL (ref 80.0–100.0)
MPV: 11.4 fL (ref 7.5–12.5)
Monocytes Relative: 7.4 %
Neutro Abs: 2984 cells/uL (ref 1500–7800)
Neutrophils Relative %: 58.5 %
Platelets: 256 10*3/uL (ref 140–400)
RBC: 4.63 10*6/uL (ref 3.80–5.10)
RDW: 11.8 % (ref 11.0–15.0)
Total Lymphocyte: 30.5 %
WBC: 5.1 10*3/uL (ref 3.8–10.8)

## 2022-11-19 LAB — C3 AND C4
C3 Complement: 117 mg/dL (ref 83–193)
C4 Complement: 17 mg/dL (ref 15–57)

## 2022-11-19 LAB — PROTEIN / CREATININE RATIO, URINE
Creatinine, Urine: 51 mg/dL (ref 20–275)
Protein/Creat Ratio: 98 mg/g creat (ref 24–184)
Protein/Creatinine Ratio: 0.098 mg/mg creat (ref 0.024–0.184)
Total Protein, Urine: 5 mg/dL (ref 5–24)

## 2022-11-19 LAB — ANTI-DNA ANTIBODY, DOUBLE-STRANDED: ds DNA Ab: 2 IU/mL

## 2022-11-19 LAB — SJOGRENS SYNDROME-A EXTRACTABLE NUCLEAR ANTIBODY: SSA (Ro) (ENA) Antibody, IgG: 3 AI — AB

## 2022-11-19 LAB — SEDIMENTATION RATE: Sed Rate: 6 mm/h (ref 0–20)

## 2022-11-19 LAB — ANTI-NUCLEAR AB-TITER (ANA TITER): ANA Titer 1: 1:80 {titer} — ABNORMAL HIGH

## 2022-11-19 LAB — RNP ANTIBODY: Ribonucleic Protein(ENA) Antibody, IgG: 2.4 AI — AB

## 2022-11-19 NOTE — Progress Notes (Signed)
RNP and Ro antibodies remain positive.   dsDNA is negative.

## 2022-11-19 NOTE — Progress Notes (Signed)
ANA remains positive-low titer.   No medication changes recommended at this time.

## 2022-11-25 ENCOUNTER — Other Ambulatory Visit: Payer: Self-pay | Admitting: Rheumatology

## 2022-11-25 DIAGNOSIS — M7918 Myalgia, other site: Secondary | ICD-10-CM

## 2022-11-25 DIAGNOSIS — F32A Depression, unspecified: Secondary | ICD-10-CM

## 2022-11-26 NOTE — Telephone Encounter (Signed)
Next Visit: 04/13/2023  Last Visit: 11/17/2022  Last Fill: 08/21/2022  Dx: Other fatigue and Myofascial pain   Current Dose per office note on 11/17/2022: Cymbalta 30 mg 1 capsule daily.   Okay to refill Cymbalta?

## 2022-12-11 ENCOUNTER — Encounter: Payer: Self-pay | Admitting: Nurse Practitioner

## 2023-01-06 DIAGNOSIS — M7918 Myalgia, other site: Secondary | ICD-10-CM

## 2023-01-06 DIAGNOSIS — F419 Anxiety disorder, unspecified: Secondary | ICD-10-CM

## 2023-01-06 MED ORDER — DULOXETINE HCL 30 MG PO CPEP: ORAL_CAPSULE | ORAL | 2 refills | Status: DC

## 2023-01-06 NOTE — Telephone Encounter (Signed)
Next Visit: 04/13/2023  Last Visit: 11/17/2022  Last Fill: 11/26/2022  DX: Anxiety and depression   Current Dose per office note on 11/17/2022: Cymbalta 30 mg 1 capsule daily.   Okay to refill cymbalta?

## 2023-01-09 ENCOUNTER — Ambulatory Visit: Payer: Commercial Managed Care - HMO | Admitting: Radiology

## 2023-01-16 ENCOUNTER — Ambulatory Visit (INDEPENDENT_AMBULATORY_CARE_PROVIDER_SITE_OTHER): Payer: 59 | Admitting: Radiology

## 2023-01-16 ENCOUNTER — Encounter: Payer: Self-pay | Admitting: Radiology

## 2023-01-16 ENCOUNTER — Other Ambulatory Visit (HOSPITAL_COMMUNITY)
Admission: RE | Admit: 2023-01-16 | Discharge: 2023-01-16 | Disposition: A | Discharge status: Home / Self Care | Payer: 59 | Source: Ambulatory Visit | Attending: Radiology | Admitting: Radiology

## 2023-01-16 VITALS — BP 134/76 | Ht 65.75 in | Wt 169.0 lb

## 2023-01-16 DIAGNOSIS — Z01419 Encounter for gynecological examination (general) (routine) without abnormal findings: Secondary | ICD-10-CM

## 2023-01-16 DIAGNOSIS — Z113 Encounter for screening for infections with a predominantly sexual mode of transmission: Secondary | ICD-10-CM | POA: Insufficient documentation

## 2023-01-16 DIAGNOSIS — R69 Illness, unspecified: Secondary | ICD-10-CM | POA: Diagnosis not present

## 2023-01-16 NOTE — Progress Notes (Signed)
   Joanne Wallace [birth year] [id number]   History:  27 y.o. G0 presents for annual exam.No new gyn concerns.  Gynecologic History Patient's last menstrual period was 12/20/2022 (exact date). Period Cycle (Days): 28 Period Duration (Days): 6 Period Pattern: Regular Menstrual Flow: Heavy (heavy first 2 days, then lighter) Menstrual Control:  (period panties) Dysmenorrhea: (!) Severe Dysmenorrhea Symptoms: Cramping Contraception/Family planning: condoms Sexually active: yes Last Pap: 2021. Results were: normal   Obstetric History OB History  Gravida Para Term Preterm AB Living  0 0 0 0 0 0  SAB IAB Ectopic Multiple Live Births  0 0 0 0 0     The following portions of the patient's history were reviewed and updated as appropriate: allergies, current medications, past family history, past medical history, past social history, past surgical history, and problem list.  Review of Systems Pertinent items noted in HPI and remainder of comprehensive ROS otherwise negative.   Past medical history, past surgical history, family history and social history were all reviewed and documented in the EPIC chart.   Exam:  Vitals:   01/16/23 1021  BP: 134/76  Weight: 169 lb (76.7 kg)  Height: 5' 5.75" (1.67 m)   Body mass index is 27.49 kg/m.  General appearance:  Normal Thyroid:  Symmetrical, normal in size, without palpable masses or nodularity. Respiratory  Auscultation:  Clear without wheezing or rhonchi Cardiovascular  Auscultation:  Regular rate, without rubs, murmurs or gallops  Edema/varicosities:  Not grossly evident Abdominal  Soft,nontender, without masses, guarding or rebound.  Liver/spleen:  No organomegaly noted  Hernia:  None appreciated  Skin  Inspection:  Grossly normal Breasts: Examined lying and sitting.   Right: Without masses, retractions, nipple discharge or axillary adenopathy.   Left: Without masses, retractions, nipple discharge or axillary  adenopathy. Genitourinary   Inguinal/mons:  Normal without inguinal adenopathy  External genitalia:  Normal appearing vulva with no masses, tenderness, or lesions  BUS/Urethra/Skene's glands:  Normal without masses or exudate  Vagina:  Normal appearing with normal color and discharge, no lesions  Cervix:  Normal appearing without discharge or lesions  Uterus:  Normal in size, shape and contour.  Mobile, nontender  Adnexa/parametria:     Rt: Normal in size, without masses or tenderness.   Lt: Normal in size, without masses or tenderness.  Anus and perineum: Normal   Patient informed chaperone available to be present for breast and pelvic exam. Patient has requested no chaperone to be present. Patient has been advised what will be completed during breast and pelvic exam.   Assessment/Plan:   1. Well woman exam with routine gynecological exam - Cytology - PAP( [name])  2. VD (venereal disease) screening - Cytology - PAP( [name])   Discussed SBE, colonoscopy and DEXA screening as directed/appropriate. Recommend 13mns of exercise weekly, including weight bearing exercise. Encouraged the use of seatbelts and sunscreen. Return in 1 year for annual or as needed.   CRubbie BattiestB WHNP-BC 10:42 AM 01/16/2023

## 2023-01-19 LAB — CYTOLOGY - PAP
Chlamydia: NEGATIVE
Comment: NEGATIVE
Comment: NEGATIVE
Comment: NORMAL
Diagnosis: NEGATIVE
Neisseria Gonorrhea: NEGATIVE
Trichomonas: NEGATIVE

## 2023-01-29 ENCOUNTER — Other Ambulatory Visit: Payer: Self-pay | Admitting: Physician Assistant

## 2023-01-29 DIAGNOSIS — M7918 Myalgia, other site: Secondary | ICD-10-CM

## 2023-01-29 DIAGNOSIS — F419 Anxiety disorder, unspecified: Secondary | ICD-10-CM

## 2023-02-26 DIAGNOSIS — Z79899 Other long term (current) drug therapy: Secondary | ICD-10-CM | POA: Diagnosis not present

## 2023-02-27 ENCOUNTER — Encounter: Payer: Self-pay | Admitting: Internal Medicine

## 2023-03-09 ENCOUNTER — Other Ambulatory Visit: Payer: Self-pay

## 2023-03-09 MED ORDER — HYDROXYCHLOROQUINE SULFATE 200 MG PO TABS: ORAL_TABLET | ORAL | 0 refills | Status: DC

## 2023-03-09 NOTE — Telephone Encounter (Signed)
Patient called requesting a refill of PLQ to [facility] on [address]   Last Fill: 11/10/2022  Eye exam: 02/26/2023   Labs: 11/17/2022 CBC WNL. ESR WNL.Complements WNL.   Protein creatinine ratio WNL. CMP WNL.RNP and Ro antibodies remain positive.   dsDNA is negative.ANA remains positive-low titer.   No medication changes recommended at this time.  Next Visit: 04/13/2023  Last Visit: 11/17/2022  VC:BSWHQPRFFM disease (HCC)-+ANA,+RNP,+SSA, inflammatory arthritis   Current Dose per office note on 11/17/2022: Plaquenil 200 mg 1 tablet by mouth twice daily Monday through Friday.   Okay to refill Plaquenil?

## 2023-03-17 ENCOUNTER — Ambulatory Visit: Payer: 59 | Admitting: Internal Medicine

## 2023-03-17 ENCOUNTER — Encounter: Payer: Self-pay | Admitting: Internal Medicine

## 2023-03-17 VITALS — BP 118/60 | HR 91 | Temp 98.3°F | Ht 65.75 in | Wt 170.1 lb

## 2023-03-17 DIAGNOSIS — G8929 Other chronic pain: Secondary | ICD-10-CM

## 2023-03-17 DIAGNOSIS — M25562 Pain in left knee: Secondary | ICD-10-CM | POA: Diagnosis not present

## 2023-03-17 DIAGNOSIS — M25561 Pain in right knee: Secondary | ICD-10-CM

## 2023-03-17 DIAGNOSIS — R41840 Attention and concentration deficit: Secondary | ICD-10-CM | POA: Diagnosis not present

## 2023-03-17 NOTE — Progress Notes (Signed)
   Subjective:   Patient ID: Joanne Wallace, female    DOB: 1996/02/11, 27 y.o.   MRN: [id number]  HPI The patient is a 27 YO female coming in for bilateral knee pain for months. Also concerns about attention.  Review of Systems  Constitutional: Negative.   HENT: Negative.    Eyes: Negative.   Respiratory:  Negative for cough, chest tightness and shortness of breath.   Cardiovascular:  Negative for chest pain, palpitations and leg swelling.  Gastrointestinal:  Negative for abdominal distention, abdominal pain, constipation, diarrhea, nausea and vomiting.  Musculoskeletal:  Positive for arthralgias and myalgias.  Skin: Negative.   Neurological: Negative.   Psychiatric/Behavioral:  Positive for decreased concentration.     Objective:  Physical Exam Constitutional:      Appearance: She is well-developed.  HENT:     Head: Normocephalic and atraumatic.  Cardiovascular:     Rate and Rhythm: Normal rate and regular rhythm.  Pulmonary:     Effort: Pulmonary effort is normal. No respiratory distress.     Breath sounds: Normal breath sounds. No wheezing or rales.  Abdominal:     General: Bowel sounds are normal. There is no distension.     Palpations: Abdomen is soft.     Tenderness: There is no abdominal tenderness. There is no rebound.  Musculoskeletal:        General: Tenderness present.     Cervical back: Normal range of motion.  Skin:    General: Skin is warm and dry.  Neurological:     Mental Status: She is alert and oriented to person, place, and time.     Coordination: Coordination normal.     Vitals:   03/17/23 0820  BP: 118/60  Pulse: 91  Temp: 98.3 F (36.8 C)  TempSrc: Oral  SpO2: 98%  Weight: 170 lb 2 oz (77.2 kg)  Height: 5' 5.75" (1.67 m)    Assessment & Plan:

## 2023-03-17 NOTE — Assessment & Plan Note (Signed)
Suspect pre-patellar bursitis and given stretches. Advised to take NSAIDs on a schedule for 3-5 days.

## 2023-03-17 NOTE — Patient Instructions (Addendum)
We can get you in for testing for ADD/ADHD.   Try taking aleve or advil for 3-5 days scheduled to help the knees.

## 2023-03-17 NOTE — Assessment & Plan Note (Signed)
Referral to psych for testing. She is taking cymbalta which is not helping with this. Encouraged more regular physical activity to help.

## 2023-03-30 NOTE — Progress Notes (Signed)
Office Visit Note  Patient: Joanne Wallace             Date of Birth: Apr 18, 1996           MRN: [id number]             PCP: Myrlene Broker, MD Referring: Myrlene Broker, * Visit Date: 04/13/2023 Occupation: @GUAROCC @  Subjective:  Pain in both knees   History of Present Illness: Jenita Girven is a 27 y.o. female with history of autoimmune disease and myofascial pain.  She remains on Plaquenil 200 mg 1 tablet by mouth twice daily Monday through Friday.  She is tolerating Plaquenil without any side effects and has not missed any doses recently.  Patient presents today with increased discomfort in both knee joints.  She was recently evaluated by her PCP who recommended performing the exercises.  At that time she was having some inflammation in the left knee.  At times she has difficulty performing activities that she enjoys including hiking, yoga, and gardening due to the discomfort.  She has tried taking Aleve as needed for pain relief.  She remains on Cymbalta as prescribed which she continues to find to be helpful.  She denies any recent rashes but has occasional photosensitivity.  She has occasional sores in her mouth and has ongoing sicca symptoms.  She denies any symptoms of Raynaud's phenomenon.  She denies any increased hair loss.  Activities of Daily Living:  Patient reports morning stiffness for 1-2 hours.   Patient Reports nocturnal pain.  Difficulty dressing/grooming: Denies Difficulty climbing stairs: Reports Difficulty getting out of chair: Denies Difficulty using hands for taps, buttons, cutlery, and/or writing: Reports  Review of Systems  Constitutional:  Positive for fatigue.  HENT:  Positive for mouth dryness. Negative for mouth sores.   Eyes:  Positive for dryness.  Respiratory:  Negative for shortness of breath.   Cardiovascular:  Negative for chest pain and palpitations.  Gastrointestinal:  Negative for blood in stool, constipation and diarrhea.   Endocrine: Positive for increased urination.  Genitourinary:  Positive for involuntary urination.  Musculoskeletal:  Positive for joint pain, joint pain, joint swelling and morning stiffness. Negative for gait problem, myalgias, muscle weakness, muscle tenderness and myalgias.  Skin:  Positive for sensitivity to sunlight. Negative for color change, rash and hair loss.  Allergic/Immunologic: Negative for susceptible to infections.  Neurological:  Negative for dizziness and headaches.  Hematological:  Negative for swollen glands.  Psychiatric/Behavioral:  Negative for depressed mood and sleep disturbance. The patient is nervous/anxious.     PMFS History:  Patient Active Problem List   Diagnosis Date Noted   Concentration deficit 03/17/2023   Bilateral knee pain 03/17/2023   Autoimmune disease (HCC) 09/26/2020   Menstrual changes 05/07/2019   Fatigue 05/07/2019    Past Medical History:  Diagnosis Date   IBS (irritable bowel syndrome)    Mixed connective tissue disease (HCC)     Family History  Problem Relation Age of Onset   Breast cancer Mother    Hypothyroidism Mother    Hypothyroidism Father    Healthy Brother    Rheum arthritis Paternal Aunt    Arthritis Maternal Grandmother    Osteoarthritis Paternal Aunt    Past Surgical History:  Procedure Laterality Date   HERNIA REPAIR     double   TONSILLECTOMY AND ADENOIDECTOMY     Social History   Social History Narrative   Not on file   Immunization History  Administered Date(s) Administered  Influenza,inj,Quad PF,6+ Mos 10/16/2020, 08/11/2022   Influenza-Unspecified 08/30/2021   Moderna Sars-Covid-2 Vaccination 02/09/2020, 03/08/2020, 11/01/2020   Tdap 10/16/2020     Objective: Vital Signs: BP 120/76 (BP Location: Left Arm, Patient Position: Sitting, Cuff Size: Normal)   Pulse 72   Resp 14   Ht 5\' 5"  (1.651 m)   Wt 170 lb 12.8 oz (77.5 kg)   LMP 02/17/2023   BMI 28.42 kg/m    Physical Exam Vitals and  nursing note reviewed.  Constitutional:      Appearance: She is well-developed.  HENT:     Head: Normocephalic and atraumatic.  Eyes:     Conjunctiva/sclera: Conjunctivae normal.  Cardiovascular:     Rate and Rhythm: Normal rate and regular rhythm.     Heart sounds: Normal heart sounds.  Pulmonary:     Effort: Pulmonary effort is normal.     Breath sounds: Normal breath sounds.  Abdominal:     General: Bowel sounds are normal.     Palpations: Abdomen is soft.  Musculoskeletal:     Cervical back: Normal range of motion.  Lymphadenopathy:     Cervical: No cervical adenopathy.  Skin:    General: Skin is warm and dry.     Capillary Refill: Capillary refill takes less than 2 seconds.  Neurological:     Mental Status: She is alert and oriented to person, place, and time.  Psychiatric:        Behavior: Behavior normal.      Musculoskeletal Exam: C-spine, thoracic spine, lumbar spine have good range of motion.  Shoulder joints, elbow joints, wrist joints, MCPs, PIPs, DIPs have good range of motion with no synovitis.  Complete fist formation bilaterally.  Some tenderness in the right second MCP joint but no synovitis noted.  Hip joints have good range of motion with no groin pain.  Tenderness over bilateral trochanteric bursa.  Discomfort with range of motion of both knee joints with some fullness in the left knee.  Ankle joints have good range of motion with no tenderness or joint swelling.  CDAI Exam: CDAI Score: -- Patient Global: --; Provider Global: -- Swollen: --; Tender: -- Joint Exam 04/13/2023   No joint exam has been documented for this visit   There is currently no information documented on the homunculus. Go to the Rheumatology activity and complete the homunculus joint exam.  Investigation: No additional findings.  Imaging: No results found.  Recent Labs: Lab Results  Component Value Date   WBC 5.1 11/17/2022   HGB 14.3 11/17/2022   PLT 256 11/17/2022   NA  140 11/17/2022   K 4.0 11/17/2022   CL 108 11/17/2022   CO2 23 11/17/2022   GLUCOSE 84 11/17/2022   BUN 9 11/17/2022   CREATININE 0.60 11/17/2022   BILITOT 0.3 11/17/2022   ALKPHOS 55 06/14/2020   AST 13 11/17/2022   ALT 14 11/17/2022   PROT 7.3 11/17/2022   ALBUMIN 4.6 06/14/2020   CALCIUM 9.1 11/17/2022   GFRAA 144 02/06/2021    Speciality Comments: PLQ EYE EXAM: 02/26/2023 WNL [facility] Follow up in 1 year  Procedures:  Large Joint Inj: L knee on 04/13/2023 10:12 AM Indications: pain Details: 27 G 1.5 in needle, medial approach  Arthrogram: No  Medications: 1.5 mL lidocaine 1 %; 40 mg triamcinolone acetonide 40 MG/ML Aspirate: 0 mL Outcome: tolerated well, no immediate complications Procedure, treatment alternatives, risks and benefits explained, specific risks discussed. Consent was given by the patient. Immediately prior to  procedure a time out was called to verify the correct patient, procedure, equipment, support staff and site/side marked as required. Patient was prepped and draped in the usual sterile fashion.     Allergies: Latex   Assessment / Plan:     Visit Diagnoses: Autoimmune disease (HCC)-+ANA,+RNP,+SSA, inflammatory arthritis: She has not had any signs or symptoms of an autoimmune disease flare.  She has clinically been doing well taking Plaquenil 200 mg 1 tablet by mouth twice daily Monday to Friday.  She is tolerating Plaquenil without any side effects and has not missed any doses recently.  She has not had any recent rashes but has occasional photosensitivity.  Discussed the importance of avoiding direct sun exposure and wearing sunscreen or sun protective clothing if she plans on being outdoors.  She has occasional sores in her mouth but no nasal ulcers.  No cervical lymphadenopathy noted.  She has not had any increased hair loss.  She has ongoing sicca symptoms which have been unchanged.  Patient presents today with some increased pain in both knee  joints.  On examination she has discomfort with range of motion bilaterally with fullness in the left knee.  X-rays of the left knee were obtained today for further evaluation and after informed consent the left knee was injected with cortisone.  She tolerated procedure well.  Procedure note was completed above.  She has no other joint inflammation at this time. Lab work from 11/17/2022 was reviewed today in the office: ANA remains positive, RNP positive, Ro antibody positive, complements within normal limits, CXR within normal limits, double-stranded DNA negative, protein creatinine ratio within normal limits, CBC and CMP within normal limits.  The following lab work will be updated today for further evaluation.  She will remain on Plaquenil as prescribed.  She was advised to notify us if she develops signs or symptoms of a flare.  She will follow up in 5 months or sooner if needed.  - Plan: Protein / creatinine ratio, urine, CBC with Differential/Platelet, COMPLETE METABOLIC PANEL WITH GFR, Anti-DNA antibody, double-stranded, C3 and C4, Sedimentation rate  High risk medication use - Plaquenil 200 mg 1 tablet by mouth twice daily Monday through Friday. CBC and CMP updated on 11/17/22.  Orders for CBC and CMP released today.   PLQ EYE EXAM: 02/26/2023 WNL [facility] Follow up in 1 year   - Plan: CBC with Differential/Platelet, COMPLETE METABOLIC PANEL WITH GFR  Pain in both hands - X-rays and ultrasound were unremarkable in the past.  No synovitis noted on ultrasound from 09/26/2020.  She has some tenderness over the right second MCP joint but no synovitis was noted.  Complete fist formation bilaterally.  Discussed the importance of joint protection and muscle strengthening.  Chronic pain of left knee - Patient presents today with increased pain in both knee joints.  No recent injury or fall.  She was recently evaluated by her PCP who recommended performing the exercises.  On examination she has good  range of motion of both knees with some fullness in the left knee.  X-rays of the left knee were obtained today.  After informed consent the left knee was injected with cortisone.  She tolerated the procedure well.  Procedure note was completed above.  Aftercare was discussed.  She was advised to notify us if she develops any new or worsening symptoms.  She plans on resuming lower extremity muscle strengthening exercises once her symptoms have improved.  Plan: XR KNEE 3 VIEW LEFT  Pain in both feet: She is not experiencing any increased discomfort in her feet at this time.  Good range of motion of both ankle joints with no tenderness or synovitis.  Myofascial pain - She continues to have intermittent bouts of myofascial pain.  She remains on Cymbalta 30 mg 1 capsule daily which she continues to find to be helpful.  Other fatigue: Chronic, stable.  She enjoys gardening and yoga for exercise.  Other medical conditions are listed as follows:  Other acne  Seasonal allergies  History of IBS  Anxiety and depression  Family history of rheumatoid arthritis  Family history of gout   Orders: Orders Placed This Encounter  Procedures   Large Joint Inj   XR KNEE 3 VIEW LEFT   Protein / creatinine ratio, urine   CBC with Differential/Platelet   COMPLETE METABOLIC PANEL WITH GFR   Anti-DNA antibody, double-stranded   C3 and C4   Sedimentation rate   No orders of the defined types were placed in this encounter.    Follow-Up Instructions: Return in about 5 months (around 09/13/2023) for Autoimmune Disease.   Gearldine Bienenstock, PA-C  Note - This record has been created using Dragon software.  Chart creation errors have been sought, but may not always  have been located. Such creation errors do not reflect on  the standard of medical care.

## 2023-04-06 ENCOUNTER — Other Ambulatory Visit: Payer: Self-pay | Admitting: Physician Assistant

## 2023-04-06 DIAGNOSIS — M7918 Myalgia, other site: Secondary | ICD-10-CM

## 2023-04-06 DIAGNOSIS — F419 Anxiety disorder, unspecified: Secondary | ICD-10-CM

## 2023-04-06 NOTE — Telephone Encounter (Signed)
Last Fill: 01/06/2023   Next Visit: 04/13/2023  Last Visit: 11/17/2022  Dx: Anxiety and depression   Current Dose per office note on 11/17/2022: Cymbalta 30 mg 1 capsule daily.   Okay to refill Cymbalta?

## 2023-04-13 ENCOUNTER — Ambulatory Visit: Payer: 59 | Attending: Physician Assistant | Admitting: Physician Assistant

## 2023-04-13 ENCOUNTER — Encounter: Payer: Self-pay | Admitting: Physician Assistant

## 2023-04-13 ENCOUNTER — Ambulatory Visit (INDEPENDENT_AMBULATORY_CARE_PROVIDER_SITE_OTHER): Payer: 59

## 2023-04-13 VITALS — BP 120/76 | HR 72 | Resp 14 | Ht 65.0 in | Wt 170.8 lb

## 2023-04-13 DIAGNOSIS — M79642 Pain in left hand: Secondary | ICD-10-CM

## 2023-04-13 DIAGNOSIS — M359 Systemic involvement of connective tissue, unspecified: Secondary | ICD-10-CM

## 2023-04-13 DIAGNOSIS — L708 Other acne: Secondary | ICD-10-CM | POA: Diagnosis not present

## 2023-04-13 DIAGNOSIS — F32A Depression, unspecified: Secondary | ICD-10-CM

## 2023-04-13 DIAGNOSIS — Z8719 Personal history of other diseases of the digestive system: Secondary | ICD-10-CM

## 2023-04-13 DIAGNOSIS — Z8261 Family history of arthritis: Secondary | ICD-10-CM | POA: Diagnosis not present

## 2023-04-13 DIAGNOSIS — M79641 Pain in right hand: Secondary | ICD-10-CM

## 2023-04-13 DIAGNOSIS — M25562 Pain in left knee: Secondary | ICD-10-CM | POA: Diagnosis not present

## 2023-04-13 DIAGNOSIS — G8929 Other chronic pain: Secondary | ICD-10-CM | POA: Diagnosis not present

## 2023-04-13 DIAGNOSIS — R5383 Other fatigue: Secondary | ICD-10-CM | POA: Diagnosis not present

## 2023-04-13 DIAGNOSIS — M7918 Myalgia, other site: Secondary | ICD-10-CM

## 2023-04-13 DIAGNOSIS — M79672 Pain in left foot: Secondary | ICD-10-CM

## 2023-04-13 DIAGNOSIS — Z8269 Family history of other diseases of the musculoskeletal system and connective tissue: Secondary | ICD-10-CM | POA: Diagnosis not present

## 2023-04-13 DIAGNOSIS — F419 Anxiety disorder, unspecified: Secondary | ICD-10-CM | POA: Diagnosis not present

## 2023-04-13 DIAGNOSIS — M79671 Pain in right foot: Secondary | ICD-10-CM

## 2023-04-13 DIAGNOSIS — Z79899 Other long term (current) drug therapy: Secondary | ICD-10-CM

## 2023-04-13 DIAGNOSIS — J302 Other seasonal allergic rhinitis: Secondary | ICD-10-CM

## 2023-04-13 LAB — CBC WITH DIFFERENTIAL/PLATELET
Absolute Monocytes: 359 cells/uL (ref 200–950)
Basophils Relative: 1.3 %
HCT: 42.9 % (ref 35.0–45.0)
Lymphs Abs: 1404 cells/uL (ref 850–3900)
MCHC: 33.8 g/dL (ref 32.0–36.0)
Monocytes Relative: 6.9 %
Platelets: 246 10*3/uL (ref 140–400)
RBC: 4.66 10*6/uL (ref 3.80–5.10)
RDW: 12.1 % (ref 11.0–15.0)

## 2023-04-13 MED ORDER — LIDOCAINE HCL 1 % IJ SOLN
1.5000 mL | INTRAMUSCULAR | Status: AC | PRN
Start: 2023-04-13 — End: 2023-04-13
  Administered 2023-04-13: 1.5 mL

## 2023-04-13 MED ORDER — TRIAMCINOLONE ACETONIDE 40 MG/ML IJ SUSP
40.0000 mg | INTRAMUSCULAR | Status: AC | PRN
Start: 2023-04-13 — End: 2023-04-13
  Administered 2023-04-13: 40 mg via INTRA_ARTICULAR

## 2023-04-13 NOTE — Progress Notes (Signed)
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  Please notify the patient.

## 2023-04-14 LAB — COMPLETE METABOLIC PANEL WITH GFR
AG Ratio: 1.5 (calc) (ref 1.0–2.5)
ALT: 13 U/L (ref 6–29)
AST: 15 U/L (ref 10–30)
Albumin: 4.4 g/dL (ref 3.6–5.1)
Alkaline phosphatase (APISO): 44 U/L (ref 31–125)
BUN/Creatinine Ratio: 10 (calc) (ref 6–22)
BUN: 6 mg/dL — ABNORMAL LOW (ref 7–25)
CO2: 25 mmol/L (ref 20–32)
Calcium: 9.1 mg/dL (ref 8.6–10.2)
Chloride: 106 mmol/L (ref 98–110)
Creat: 0.61 mg/dL (ref 0.50–0.96)
Globulin: 2.9 g/dL (calc) (ref 1.9–3.7)
Glucose, Bld: 89 mg/dL (ref 65–99)
Potassium: 4.2 mmol/L (ref 3.5–5.3)
Sodium: 138 mmol/L (ref 135–146)
Total Bilirubin: 0.7 mg/dL (ref 0.2–1.2)
Total Protein: 7.3 g/dL (ref 6.1–8.1)
eGFR: 124 mL/min/{1.73_m2} (ref >=60)

## 2023-04-14 LAB — ANTI-DNA ANTIBODY, DOUBLE-STRANDED: ds DNA Ab: 2 IU/mL

## 2023-04-14 LAB — CBC WITH DIFFERENTIAL/PLATELET
Basophils Absolute: 68 cells/uL (ref 0–200)
Eosinophils Absolute: 31 cells/uL (ref 15–500)
Eosinophils Relative: 0.6 %
Hemoglobin: 14.5 g/dL (ref 11.7–15.5)
MCH: 31.1 pg (ref 27.0–33.0)
MCV: 92.1 fL (ref 80.0–100.0)
MPV: 11.7 fL (ref 7.5–12.5)
Neutro Abs: 3338 cells/uL (ref 1500–7800)
Neutrophils Relative %: 64.2 %
Total Lymphocyte: 27 %
WBC: 5.2 10*3/uL (ref 3.8–10.8)

## 2023-04-14 LAB — C3 AND C4
C3 Complement: 117 mg/dL (ref 83–193)
C4 Complement: 18 mg/dL (ref 15–57)

## 2023-04-14 LAB — SEDIMENTATION RATE: Sed Rate: 2 mm/h (ref 0–20)

## 2023-04-14 LAB — PROTEIN / CREATININE RATIO, URINE
Creatinine, Urine: 51 mg/dL (ref 20–275)
Protein/Creat Ratio: 78 mg/g creat (ref 24–184)
Protein/Creatinine Ratio: 0.078 mg/mg creat (ref 0.024–0.184)
Total Protein, Urine: 4 mg/dL — ABNORMAL LOW (ref 5–24)

## 2023-04-14 NOTE — Progress Notes (Signed)
No proteinuria.  CBC and CMP WNL ESR WNL Complements WNL

## 2023-04-15 NOTE — Progress Notes (Signed)
dsDNA is negative

## 2023-04-20 ENCOUNTER — Ambulatory Visit: Payer: Commercial Managed Care - HMO | Admitting: Internal Medicine

## 2023-04-29 ENCOUNTER — Other Ambulatory Visit: Payer: Self-pay | Admitting: Physician Assistant

## 2023-04-29 DIAGNOSIS — F419 Anxiety disorder, unspecified: Secondary | ICD-10-CM

## 2023-04-29 DIAGNOSIS — M7918 Myalgia, other site: Secondary | ICD-10-CM

## 2023-05-29 ENCOUNTER — Other Ambulatory Visit: Payer: Self-pay | Admitting: Physician Assistant

## 2023-05-29 NOTE — Telephone Encounter (Signed)
Last Fill: 03/09/2023  Eye exam: 02/26/2023 WNL    Labs: 04/13/2023 CBC and CMP WNL   Next Visit: 09/28/2023  Last Visit: 04/13/2023  DX:Autoimmune disease   Current Dose per office note 04/13/2023: Plaquenil 200 mg 1 tablet by mouth twice daily Monday through Friday.   Okay to refill Plaquenil?

## 2023-06-22 ENCOUNTER — Ambulatory Visit: Payer: Commercial Managed Care - HMO | Admitting: Internal Medicine

## 2023-06-29 ENCOUNTER — Other Ambulatory Visit: Payer: Self-pay | Admitting: Physician Assistant

## 2023-06-29 DIAGNOSIS — F419 Anxiety disorder, unspecified: Secondary | ICD-10-CM

## 2023-06-29 DIAGNOSIS — M7918 Myalgia, other site: Secondary | ICD-10-CM

## 2023-06-29 NOTE — Telephone Encounter (Signed)
Last Fill: 04/06/2023  Next Visit: 09/28/2023  Last Visit: 04/13/2023  Dx: Myofascial pain   Current Dose per office note on 04/13/2023: Cymbalta 30 mg 1 capsule daily   Okay to refill Cymbalta?

## 2023-07-01 ENCOUNTER — Encounter (INDEPENDENT_AMBULATORY_CARE_PROVIDER_SITE_OTHER): Payer: Self-pay

## 2023-07-23 ENCOUNTER — Ambulatory Visit: Payer: Commercial Managed Care - HMO | Admitting: Internal Medicine

## 2023-08-16 ENCOUNTER — Encounter: Payer: Self-pay | Admitting: Internal Medicine

## 2023-08-22 ENCOUNTER — Other Ambulatory Visit: Payer: Self-pay | Admitting: Physician Assistant

## 2023-08-24 NOTE — Telephone Encounter (Signed)
Last Fill: 05/29/2023  Eye exam: 02/26/2023 WNL    Labs: 04/13/2023 CBC and CMP WNL   Next Visit: 09/28/2023  Last Visit: 04/13/2023  DX:Autoimmune disease (HCC)-+ANA,+RNP,+SSA, inflammatory arthritis   Current Dose per office note 04/13/2023: Plaquenil 200 mg 1 tablet by mouth twice daily Monday through Friday.   Okay to refill Plaquenil?

## 2023-08-27 ENCOUNTER — Encounter: Payer: Self-pay | Admitting: Internal Medicine

## 2023-08-27 ENCOUNTER — Ambulatory Visit: Payer: 59 | Admitting: Internal Medicine

## 2023-08-27 VITALS — BP 122/64 | HR 94 | Temp 99.0°F | Ht 65.0 in | Wt 172.0 lb

## 2023-08-27 DIAGNOSIS — Z0000 Encounter for general adult medical examination without abnormal findings: Secondary | ICD-10-CM

## 2023-08-27 DIAGNOSIS — M359 Systemic involvement of connective tissue, unspecified: Secondary | ICD-10-CM

## 2023-08-27 DIAGNOSIS — R5383 Other fatigue: Secondary | ICD-10-CM

## 2023-08-27 DIAGNOSIS — R41840 Attention and concentration deficit: Secondary | ICD-10-CM | POA: Diagnosis not present

## 2023-08-27 LAB — VITAMIN D 25 HYDROXY (VIT D DEFICIENCY, FRACTURES): VITD: 30.09 ng/mL (ref 30.00–100.00)

## 2023-08-27 LAB — FERRITIN: Ferritin: 69.1 ng/mL (ref 10.0–291.0)

## 2023-08-27 LAB — VITAMIN B12: Vitamin B-12: 356 pg/mL (ref 211–911)

## 2023-08-27 MED ORDER — DULOXETINE HCL 20 MG PO CPEP: 20.0000 mg | ORAL_CAPSULE | Freq: Every day | ORAL | 3 refills | Status: DC

## 2023-08-27 NOTE — Assessment & Plan Note (Signed)
Flu shot complete. Tetanus up to date.Pap smear up to date. Counseled about sun safety and mole surveillance. Counseled about the dangers of distracted driving. Given 10 year screening recommendations.

## 2023-08-27 NOTE — Assessment & Plan Note (Signed)
Having some more concerns about joint pain and struggling with yearly eye exam due to poor medical coverage this is a significant cost for her. She is taking plaquenil 200 mg BID mon to fri.

## 2023-08-27 NOTE — Assessment & Plan Note (Signed)
Overall worse. Reducing cymbalta to 20 mg daily. Checking B12, vitamin D , ferritin, B1 to assess nutrients. Prior thiamine deficiency still taking replacement.

## 2023-08-27 NOTE — Progress Notes (Signed)
Subjective:   Patient ID: Joanne Wallace, female    DOB: July 13, 1996, 27 y.o.   MRN: [id number]  HPI The patient is a 27 YO female coming in for physical with ongoing health concerns.  PMH, [facility], social history reviewed and updated  Review of Systems  Constitutional:  Positive for activity change and fatigue.  HENT: Negative.    Eyes: Negative.   Respiratory:  Negative for cough, chest tightness and shortness of breath.   Cardiovascular:  Negative for chest pain, palpitations and leg swelling.  Gastrointestinal:  Negative for abdominal distention, abdominal pain, constipation, diarrhea, nausea and vomiting.  Musculoskeletal:  Positive for arthralgias and myalgias.  Skin: Negative.   Neurological: Negative.   Psychiatric/Behavioral:  Positive for decreased concentration and dysphoric mood.     Objective:  Physical Exam Constitutional:      Appearance: She is well-developed.  HENT:     Head: Normocephalic and atraumatic.  Cardiovascular:     Rate and Rhythm: Normal rate and regular rhythm.  Pulmonary:     Effort: Pulmonary effort is normal. No respiratory distress.     Breath sounds: Normal breath sounds. No wheezing or rales.  Abdominal:     General: Bowel sounds are normal. There is no distension.     Palpations: Abdomen is soft.     Tenderness: There is no abdominal tenderness. There is no rebound.  Musculoskeletal:     Cervical back: Normal range of motion.  Skin:    General: Skin is warm and dry.  Neurological:     Mental Status: She is alert and oriented to person, place, and time.     Coordination: Coordination normal.     Vitals:   08/27/23 0942  BP: 122/64  Pulse: 94  Temp: 99 F (37.2 C)  TempSrc: Oral  SpO2: 97%  Weight: 172 lb (78 kg)  Height: 5\' 5"  (1.651 m)    Assessment & Plan:

## 2023-08-27 NOTE — Assessment & Plan Note (Signed)
Cymbalta seems to cause tiredness and concentration deficit. Will reduce dosing to 20 mg daily as she is noticing some benefit in joint pain. New rx done.

## 2023-08-27 NOTE — Patient Instructions (Addendum)
Try the carpal tunnel brace to see if this helps.  We will go down on cymbalta to 20 mg and sent in this new rx.

## 2023-08-31 LAB — VITAMIN B1: Vitamin B1 (Thiamine): 89 nmol/L — ABNORMAL HIGH (ref 8–30)

## 2023-09-15 NOTE — Progress Notes (Signed)
Office Visit Note  Patient: Joanne Wallace             Date of Birth: August 04, 1996           MRN: [id number]             PCP: Myrlene Broker, MD Referring: Myrlene Broker, * Visit Date: 09/28/2023 Occupation: @GUAROCC @  Subjective:  Patient management  History of Present Illness: Joanne Wallace is a 27 y.o. female with a autoimmune disease and myofascial pain syndrome.  She states about a week ago she bumped her right knee against an object.  Is not that knee is gradually getting better.  None of the other joints are painful.  She continues to have dry mouth and dry eye symptoms.  She occasionally have oral ulcers.  She had oral ulcers this week.  There is no history of Raynaud's, photosensitivity or lymphadenopathy.  There is no history of inflammatory arthritis.  She has been gardening.  She states she is unable to do much yoga as she does not like needling.  She had a cortisone injection to her left knee joint in May 2024.  She has noticed improvement in the discomfort but the kneeling causes discomfort.  She states she has a nucleated and since that she has noticed improvement in her generalized pain and fatigue.    Activities of Daily Living:  Patient reports morning stiffness for 1 hour.   Patient Denies nocturnal pain.  Difficulty dressing/grooming: Denies Difficulty climbing stairs: Reports Difficulty getting out of chair: Denies Difficulty using hands for taps, buttons, cutlery, and/or writing: Reports  Review of Systems  Constitutional:  Positive for fatigue.  HENT:  Positive for mouth sores and mouth dryness.   Eyes:  Positive for dryness.  Respiratory:  Negative for shortness of breath.   Cardiovascular:  Negative for chest pain and palpitations.  Gastrointestinal:  Negative for blood in stool, constipation and diarrhea.  Endocrine: Positive for increased urination.  Genitourinary:  Negative for painful urination and involuntary urination.   Musculoskeletal:  Positive for joint pain, joint pain, myalgias, morning stiffness, muscle tenderness and myalgias. Negative for gait problem, joint swelling and muscle weakness.  Skin:  Negative for color change, rash, hair loss and sensitivity to sunlight.  Allergic/Immunologic: Positive for susceptible to infections.  Neurological:  Negative for dizziness and headaches.  Hematological:  Negative for swollen glands.  Psychiatric/Behavioral:  Negative for depressed mood and sleep disturbance. The patient is not nervous/anxious.     PMFS History:  Patient Active Problem List   Diagnosis Date Noted   Routine general medical examination at a health care facility 08/27/2023   Concentration deficit 03/17/2023   Bilateral knee pain 03/17/2023   Autoimmune disease (HCC) 09/26/2020   Menstrual changes 05/07/2019   Fatigue 05/07/2019    Past Medical History:  Diagnosis Date   IBS (irritable bowel syndrome)    Mixed connective tissue disease (HCC)     Family History  Problem Relation Age of Onset   Breast cancer Mother    Hypothyroidism Mother    Hypothyroidism Father    Healthy Brother    Rheum arthritis Paternal Aunt    Arthritis Maternal Grandmother    Osteoarthritis Paternal Aunt    Past Surgical History:  Procedure Laterality Date   HERNIA REPAIR     double   TONSILLECTOMY AND ADENOIDECTOMY     Social History   Social History Narrative   Not on file   Immunization History  Administered Date(s) Administered  Influenza,inj,Quad PF,6+ Mos 10/16/2020, 08/11/2022   Influenza-Unspecified 08/30/2021, 08/26/2023   Moderna Sars-Covid-2 Vaccination 02/09/2020, 03/08/2020, 11/01/2020   Tdap 10/16/2020   Unspecified SARS-COV-2 Vaccination 08/26/2023     Objective: Vital Signs: BP 130/79 (BP Location: Left Arm, Patient Position: Sitting, Cuff Size: Normal)   Pulse 66   Resp 16   Ht 5' 5.5" (1.664 m)   Wt 174 lb (78.9 kg)   BMI 28.51 kg/m    Physical Exam Vitals and  nursing note reviewed.  Constitutional:      Appearance: She is well-developed.  HENT:     Head: Normocephalic and atraumatic.  Eyes:     Conjunctiva/sclera: Conjunctivae normal.  Cardiovascular:     Rate and Rhythm: Normal rate and regular rhythm.     Heart sounds: Normal heart sounds.  Pulmonary:     Effort: Pulmonary effort is normal.     Breath sounds: Normal breath sounds.  Abdominal:     General: Bowel sounds are normal.     Palpations: Abdomen is soft.  Musculoskeletal:     Cervical back: Normal range of motion.  Lymphadenopathy:     Cervical: No cervical adenopathy.  Skin:    General: Skin is warm and dry.     Capillary Refill: Capillary refill takes less than 2 seconds.  Neurological:     Mental Status: She is alert and oriented to person, place, and time.  Psychiatric:        Behavior: Behavior normal.      Musculoskeletal Exam: Cervical, thoracic and lumbar spine 1 good range of motion.  Shoulder joints, elbow joints, wrist joints, MCPs PIPs and DIPs were in good range of motion with no synovitis.  Hip joints, knee joints, ankles, MTPs and PIPs been good range of motion with no synovitis.  She had a few tender joints.  CDAI Exam: CDAI Score: -- Patient Global: --; Provider Global: -- Swollen: --; Tender: -- Joint Exam 09/28/2023   No joint exam has been documented for this visit   There is currently no information documented on the homunculus. Go to the Rheumatology activity and complete the homunculus joint exam.  Investigation: No additional findings.  Imaging: No results found.  Recent Labs: Lab Results  Component Value Date   WBC 5.2 04/13/2023   HGB 14.5 04/13/2023   PLT 246 04/13/2023   NA 138 04/13/2023   K 4.2 04/13/2023   CL 106 04/13/2023   CO2 25 04/13/2023   GLUCOSE 89 04/13/2023   BUN 6 (L) 04/13/2023   CREATININE 0.61 04/13/2023   BILITOT 0.7 04/13/2023   ALKPHOS 55 06/14/2020   AST 15 04/13/2023   ALT 13 04/13/2023   PROT 7.3  04/13/2023   ALBUMIN 4.6 06/14/2020   CALCIUM 9.1 04/13/2023   GFRAA 144 02/06/2021    Speciality Comments: PLQ EYE EXAM: 02/26/2023 WNL [facility] Follow up in 1 year  Procedures:  No procedures performed Allergies: Latex   Assessment / Plan:     Visit Diagnoses: MCTD (mixed connective tissue disease) (HCC)+ANA,+RNP,+SSA, inflammatory arthritis-patient states that she continues to have fatigue, dry mouth and dry eyes.  She also had recently oral ulcers which resolved by themselves.  She has not noticed any joint inflammation.  She has been having some discomfort in the knee joints due to recent injury.  She states her overall discomfort has improved since she has occasional.  She denies any history of Raynaud's phenomenon, malar rash, photosensitivity or lymphadenopathy.  Over-the-counter products for dry mouth mouth  and dry eyes were discussed.  She denies any history of shortness of breath or palpitations.  I will obtain labs today.  Plan: Protein / creatinine ratio, urine, Anti-DNA antibody, double-stranded, C3 and C4, Sedimentation rate, RNP Antibody, Sjogrens syndrome-A extractable nuclear antibody  High risk medication use - Plaquenil 200 mg 1 tablet by mouth twice daily Monday through Friday. PLQ EYE EXAM: 02/26/2023.  Information for immunization was placed in the AVS.- Plan: CBC with Differential/Platelet, COMPLETE METABOLIC PANEL WITH GFR  Pain in both hands-she has intermittent discomfort in her hands.  No synovitis was noted.  Chronic pain of left knee-patient states the discomfort in her left knee joint improved after the cortisone injection.  No warmth swelling or effusion was noted.  Pain in both feet-she has some discomfort in her feet intermittently.  No synovitis or tenderness was noted on the examination today.  Myofascial pain -she continues to have generalized pain and discomfort.  Although her discomfort is improved since she has a new kitten.  Cymbalta 30 mg 1 capsule  daily  Other fatigue-she history of fatigue.  Urinary frequency -she only has been having urinary frequency.  Patient states she has had UTI in the past.  Per her request we will check UA today.  Plan: Urinalysis, Routine w reflex microscopic  Other medical problems are listed as follows:  Other acne  Seasonal allergies  History of IBS  Anxiety and depression  Family history of gout  Family history of rheumatoid arthritis  Orders: Orders Placed This Encounter  Procedures   Protein / creatinine ratio, urine   CBC with Differential/Platelet   COMPLETE METABOLIC PANEL WITH GFR   Urinalysis, Routine w reflex microscopic   Anti-DNA antibody, double-stranded   C3 and C4   Sedimentation rate   RNP Antibody   Sjogrens syndrome-A extractable nuclear antibody   No orders of the defined types were placed in this encounter.    Follow-Up Instructions: Return in about 5 months (around 02/26/2024) for Autoimmune disease.   Pollyann Savoy, MD  Note - This record has been created using Animal nutritionist.  Chart creation errors have been sought, but may not always  have been located. Such creation errors do not reflect on  the standard of medical care.

## 2023-09-28 ENCOUNTER — Encounter: Payer: Self-pay | Admitting: Rheumatology

## 2023-09-28 ENCOUNTER — Ambulatory Visit: Payer: 59 | Attending: Rheumatology | Admitting: Rheumatology

## 2023-09-28 VITALS — BP 130/79 | HR 66 | Resp 16 | Ht 65.5 in | Wt 174.0 lb

## 2023-09-28 DIAGNOSIS — G8929 Other chronic pain: Secondary | ICD-10-CM

## 2023-09-28 DIAGNOSIS — Z79899 Other long term (current) drug therapy: Secondary | ICD-10-CM

## 2023-09-28 DIAGNOSIS — M25562 Pain in left knee: Secondary | ICD-10-CM

## 2023-09-28 DIAGNOSIS — L708 Other acne: Secondary | ICD-10-CM | POA: Diagnosis not present

## 2023-09-28 DIAGNOSIS — M7918 Myalgia, other site: Secondary | ICD-10-CM | POA: Diagnosis not present

## 2023-09-28 DIAGNOSIS — R5383 Other fatigue: Secondary | ICD-10-CM | POA: Diagnosis not present

## 2023-09-28 DIAGNOSIS — F419 Anxiety disorder, unspecified: Secondary | ICD-10-CM

## 2023-09-28 DIAGNOSIS — M79641 Pain in right hand: Secondary | ICD-10-CM

## 2023-09-28 DIAGNOSIS — M79671 Pain in right foot: Secondary | ICD-10-CM | POA: Diagnosis not present

## 2023-09-28 DIAGNOSIS — Z8261 Family history of arthritis: Secondary | ICD-10-CM

## 2023-09-28 DIAGNOSIS — R350 Frequency of micturition: Secondary | ICD-10-CM | POA: Diagnosis not present

## 2023-09-28 DIAGNOSIS — J302 Other seasonal allergic rhinitis: Secondary | ICD-10-CM

## 2023-09-28 DIAGNOSIS — Z8719 Personal history of other diseases of the digestive system: Secondary | ICD-10-CM

## 2023-09-28 DIAGNOSIS — M79642 Pain in left hand: Secondary | ICD-10-CM

## 2023-09-28 DIAGNOSIS — Z8269 Family history of other diseases of the musculoskeletal system and connective tissue: Secondary | ICD-10-CM

## 2023-09-28 DIAGNOSIS — M351 Other overlap syndromes: Secondary | ICD-10-CM

## 2023-09-28 DIAGNOSIS — M359 Systemic involvement of connective tissue, unspecified: Secondary | ICD-10-CM

## 2023-09-28 DIAGNOSIS — F32A Depression, unspecified: Secondary | ICD-10-CM

## 2023-09-28 DIAGNOSIS — M79672 Pain in left foot: Secondary | ICD-10-CM

## 2023-09-28 NOTE — Patient Instructions (Signed)
Vaccines You are taking a medication(s) that can suppress your immune system.  The following immunizations are recommended: Flu annually Covid-19  Td/Tdap (tetanus, diphtheria, pertussis) every 10 years Pneumonia (Prevnar 15 then Pneumovax 23 at least 1 year apart.  Alternatively, can take Prevnar 20 without needing additional dose) Shingrix: 2 doses from 4 weeks to 6 months apart  Please check with your PCP to make sure you are up to date.  

## 2023-09-30 LAB — COMPLETE METABOLIC PANEL WITH GFR
AG Ratio: 1.6 (calc) (ref 1.0–2.5)
ALT: 17 U/L (ref 6–29)
AST: 21 U/L (ref 10–30)
Albumin: 4.8 g/dL (ref 3.6–5.1)
Alkaline phosphatase (APISO): 54 U/L (ref 31–125)
BUN: 7 mg/dL (ref 7–25)
CO2: 24 mmol/L (ref 20–32)
Calcium: 9.8 mg/dL (ref 8.6–10.2)
Chloride: 103 mmol/L (ref 98–110)
Creat: 0.75 mg/dL (ref 0.50–0.96)
Globulin: 3 g/dL (ref 1.9–3.7)
Glucose, Bld: 89 mg/dL (ref 65–99)
Potassium: 4.1 mmol/L (ref 3.5–5.3)
Sodium: 139 mmol/L (ref 135–146)
Total Bilirubin: 0.5 mg/dL (ref 0.2–1.2)
Total Protein: 7.8 g/dL (ref 6.1–8.1)
eGFR: 110 mL/min/{1.73_m2} (ref >=60)

## 2023-09-30 LAB — SEDIMENTATION RATE: Sed Rate: 2 mm/h (ref 0–20)

## 2023-09-30 LAB — URINALYSIS, ROUTINE W REFLEX MICROSCOPIC
Bilirubin Urine: NEGATIVE
Glucose, UA: NEGATIVE
Hgb urine dipstick: NEGATIVE
Ketones, ur: NEGATIVE
Leukocytes,Ua: NEGATIVE
Nitrite: NEGATIVE
Protein, ur: NEGATIVE
Specific Gravity, Urine: 1.004 (ref 1.001–1.035)
pH: 7.5 (ref 5.0–8.0)

## 2023-09-30 LAB — PROTEIN / CREATININE RATIO, URINE
Creatinine, Urine: 18 mg/dL — ABNORMAL LOW (ref 20–275)
Total Protein, Urine: <4 mg/dL — ABNORMAL LOW (ref 5–24)

## 2023-09-30 LAB — CBC WITH DIFFERENTIAL/PLATELET
Absolute Lymphocytes: 1359 {cells}/uL (ref 850–3900)
Absolute Monocytes: 507 {cells}/uL (ref 200–950)
Basophils Absolute: 91 {cells}/uL (ref 0–200)
Basophils Relative: 1.4 %
Eosinophils Absolute: 98 {cells}/uL (ref 15–500)
Eosinophils Relative: 1.5 %
HCT: 44.7 % (ref 35.0–45.0)
Hemoglobin: 14.5 g/dL (ref 11.7–15.5)
MCH: 30.7 pg (ref 27.0–33.0)
MCHC: 32.4 g/dL (ref 32.0–36.0)
MCV: 94.5 fL (ref 80.0–100.0)
MPV: 11.5 fL (ref 7.5–12.5)
Monocytes Relative: 7.8 %
Neutro Abs: 4446 {cells}/uL (ref 1500–7800)
Neutrophils Relative %: 68.4 %
Platelets: 337 10*3/uL (ref 140–400)
RBC: 4.73 10*6/uL (ref 3.80–5.10)
RDW: 12 % (ref 11.0–15.0)
Total Lymphocyte: 20.9 %
WBC: 6.5 10*3/uL (ref 3.8–10.8)

## 2023-09-30 LAB — C3 AND C4
C3 Complement: 129 mg/dL (ref 83–193)
C4 Complement: 17 mg/dL (ref 15–57)

## 2023-09-30 LAB — SJOGRENS SYNDROME-A EXTRACTABLE NUCLEAR ANTIBODY: SSA (Ro) (ENA) Antibody, IgG: 2.7 AI — AB

## 2023-09-30 LAB — RNP ANTIBODY: Ribonucleic Protein(ENA) Antibody, IgG: 2.8 AI — AB

## 2023-09-30 LAB — ANTI-DNA ANTIBODY, DOUBLE-STRANDED: ds DNA Ab: 2 [IU]/mL

## 2023-09-30 NOTE — Progress Notes (Signed)
Urine protein creatinine ratio is normal, RNP remains positive, SSA remains positive, CBC, CMP, UA are within normal limits.  Double-stranded ENA negative, complements normal, sed rate normal.  Labs do not indicate an autoimmune disease flare.

## 2023-10-02 NOTE — Telephone Encounter (Signed)
Routing and closing for final review.

## 2023-10-02 NOTE — Telephone Encounter (Signed)
I would recommend vitamin B6 25mg  twice daily starting 3-4 days before symptoms begin monthly to prevent the nausea

## 2023-11-17 ENCOUNTER — Other Ambulatory Visit: Payer: Self-pay | Admitting: Internal Medicine

## 2023-11-19 ENCOUNTER — Other Ambulatory Visit: Payer: Self-pay | Admitting: Rheumatology

## 2023-11-19 NOTE — Telephone Encounter (Signed)
Last Fill: 08/24/2023  Eye exam: 02/26/2023 WNL    Labs: 09/28/2023 CBC, CMP, UA are within normal limits.     Next Visit: 02/29/2024  Last Visit: 09/28/2023  KG:URKY (mixed connective tissue disease)   Current Dose per office note 09/28/2023: Plaquenil 200 mg 1 tablet by mouth twice daily Monday through Friday.   Okay to refill Plaquenil?

## 2023-11-30 ENCOUNTER — Encounter: Payer: Self-pay | Admitting: Internal Medicine

## 2023-11-30 MED ORDER — DULOXETINE HCL 30 MG PO CPEP: 30.0000 mg | ORAL_CAPSULE | Freq: Every day | ORAL | 1 refills | Status: DC

## 2023-12-29 DIAGNOSIS — F339 Major depressive disorder, recurrent, unspecified: Secondary | ICD-10-CM | POA: Diagnosis not present

## 2024-01-05 DIAGNOSIS — F339 Major depressive disorder, recurrent, unspecified: Secondary | ICD-10-CM | POA: Diagnosis not present

## 2024-01-12 DIAGNOSIS — F339 Major depressive disorder, recurrent, unspecified: Secondary | ICD-10-CM | POA: Diagnosis not present

## 2024-01-18 ENCOUNTER — Ambulatory Visit (INDEPENDENT_AMBULATORY_CARE_PROVIDER_SITE_OTHER): Payer: 59 | Admitting: Internal Medicine

## 2024-01-18 ENCOUNTER — Encounter: Payer: Self-pay | Admitting: Internal Medicine

## 2024-01-18 VITALS — BP 122/80 | HR 86 | Temp 98.9°F | Ht 65.5 in | Wt 184.0 lb

## 2024-01-18 DIAGNOSIS — R5383 Other fatigue: Secondary | ICD-10-CM

## 2024-01-18 DIAGNOSIS — M359 Systemic involvement of connective tissue, unspecified: Secondary | ICD-10-CM | POA: Diagnosis not present

## 2024-01-18 DIAGNOSIS — G2581 Restless legs syndrome: Secondary | ICD-10-CM | POA: Diagnosis not present

## 2024-01-18 LAB — CBC
HCT: 42.2 % (ref 36.0–46.0)
Hemoglobin: 14.3 g/dL (ref 12.0–15.0)
MCHC: 33.7 g/dL (ref 30.0–36.0)
MCV: 92.7 fL (ref 78.0–100.0)
Platelets: 249 10*3/uL (ref 150.0–400.0)
RBC: 4.56 Mil/uL (ref 3.87–5.11)
RDW: 12.4 % (ref 11.5–15.5)
WBC: 5 10*3/uL (ref 4.0–10.5)

## 2024-01-18 LAB — T4, FREE: Free T4: 0.64 ng/dL (ref 0.60–1.60)

## 2024-01-18 LAB — COMPREHENSIVE METABOLIC PANEL
ALT: 18 U/L (ref 0–35)
AST: 19 U/L (ref 0–37)
Albumin: 4.2 g/dL (ref 3.5–5.2)
Alkaline Phosphatase: 45 U/L (ref 39–117)
BUN: 9 mg/dL (ref 6–23)
CO2: 23 meq/L (ref 19–32)
Calcium: 8.7 mg/dL (ref 8.4–10.5)
Chloride: 108 meq/L (ref 96–112)
Creatinine, Ser: 0.66 mg/dL (ref 0.40–1.20)
GFR: 118.1 mL/min (ref >60.00)
Glucose, Bld: 89 mg/dL (ref 70–99)
Potassium: 3.9 meq/L (ref 3.5–5.1)
Sodium: 139 meq/L (ref 135–145)
Total Bilirubin: 0.6 mg/dL (ref 0.2–1.2)
Total Protein: 7 g/dL (ref 6.0–8.3)

## 2024-01-18 LAB — TSH: TSH: 0.75 u[IU]/mL (ref 0.35–5.50)

## 2024-01-18 LAB — FERRITIN: Ferritin: 10.9 ng/mL (ref 10.0–291.0)

## 2024-01-18 MED ORDER — DULOXETINE HCL 20 MG PO CPEP: 40.0000 mg | ORAL_CAPSULE | Freq: Every day | ORAL | 3 refills | Status: AC

## 2024-01-18 MED ORDER — ROPINIROLE HCL 0.5 MG PO TABS: 0.5000 mg | ORAL_TABLET | Freq: Every day | ORAL | 1 refills | Status: DC

## 2024-01-18 NOTE — Assessment & Plan Note (Signed)
 Having worsening restless leg lately and checking CBC and ferritin. Rx requip 0.5 mg at bedtime prn.

## 2024-01-18 NOTE — Assessment & Plan Note (Signed)
 Having menstrual related symptoms and some hormonal changes as well. Some nausea and diarrhea around cycle and hot flashes. She is still having regular periods and bleeding pattern has not changed. Checking CMP, CBC, ferritin, TSH, free T4.

## 2024-01-18 NOTE — Patient Instructions (Addendum)
 We will check the labs today.  We have sent in the requip to use for restless legs.

## 2024-01-18 NOTE — Progress Notes (Signed)
   Subjective:   Patient ID: Joanne Wallace, female    DOB: 1996/08/17, 28 y.o.   MRN: [id number]  HPI The patient is a 28 YO female coming in for some restless leg and wanting increase in dose of cymbalta( this is helping with mood and ct disease).   Review of Systems  Constitutional: Negative.   HENT: Negative.    Eyes: Negative.   Respiratory:  Negative for cough, chest tightness and shortness of breath.   Cardiovascular:  Negative for chest pain, palpitations and leg swelling.  Gastrointestinal:  Positive for diarrhea. Negative for abdominal distention, abdominal pain, constipation, nausea and vomiting.  Endocrine: Positive for heat intolerance.  Musculoskeletal:  Positive for arthralgias and myalgias.  Skin: Negative.   Neurological: Negative.   Psychiatric/Behavioral:  Positive for dysphoric mood. The patient is nervous/anxious.     Objective:  Physical Exam Constitutional:      Appearance: She is well-developed.  HENT:     Head: Normocephalic and atraumatic.  Cardiovascular:     Rate and Rhythm: Normal rate and regular rhythm.  Pulmonary:     Effort: Pulmonary effort is normal. No respiratory distress.     Breath sounds: Normal breath sounds. No wheezing or rales.  Abdominal:     General: Bowel sounds are normal. There is no distension.     Palpations: Abdomen is soft.     Tenderness: There is no abdominal tenderness. There is no rebound.  Musculoskeletal:     Cervical back: Normal range of motion.  Skin:    General: Skin is warm and dry.  Neurological:     Mental Status: She is alert and oriented to person, place, and time.     Coordination: Coordination normal.     Vitals:   01/18/24 0925  BP: 122/80  Pulse: 86  Temp: 98.9 F (37.2 C)  TempSrc: Oral  SpO2: 98%  Weight: 184 lb (83.5 kg)  Height: 5' 5.5" (1.664 m)    Assessment & Plan:

## 2024-01-18 NOTE — Assessment & Plan Note (Signed)
 Having some relief with plaquenil 200 mg BID and cymbalta. We will increase dose of cymbalta to 40 mg daily new rx done.

## 2024-01-21 ENCOUNTER — Encounter: Payer: Self-pay | Admitting: Internal Medicine

## 2024-01-21 ENCOUNTER — Ambulatory Visit: Payer: 59 | Admitting: Radiology

## 2024-01-26 DIAGNOSIS — F339 Major depressive disorder, recurrent, unspecified: Secondary | ICD-10-CM | POA: Diagnosis not present

## 2024-01-27 ENCOUNTER — Ambulatory Visit (INDEPENDENT_AMBULATORY_CARE_PROVIDER_SITE_OTHER): Payer: 59 | Admitting: Radiology

## 2024-01-27 ENCOUNTER — Encounter: Payer: Self-pay | Admitting: Radiology

## 2024-01-27 VITALS — BP 120/82 | HR 90 | Ht 66.75 in | Wt 182.0 lb

## 2024-01-27 DIAGNOSIS — Z01419 Encounter for gynecological examination (general) (routine) without abnormal findings: Secondary | ICD-10-CM

## 2024-01-27 DIAGNOSIS — Z1331 Encounter for screening for depression: Secondary | ICD-10-CM

## 2024-01-27 DIAGNOSIS — R102 Pelvic and perineal pain: Secondary | ICD-10-CM | POA: Diagnosis not present

## 2024-01-27 MED ORDER — ESTRADIOL 0.1 MG/GM VA CREA: 1.0000 g | TOPICAL_CREAM | VAGINAL | 3 refills | Status: DC

## 2024-01-27 NOTE — Progress Notes (Signed)
 Joanne Wallace 07/26/1996 [id number]   History:  28 y.o. G0 presents for annual exam. No new partners. Continues to have a pinching sensation in her clitoris and experiences a vasovagal reaction when cervix is touched. This has been doing on for years and she is hesitant to have sex because of it. Able to orgasm with a vibrator only. Also has right hip pain- has an appt scheduled with rheumatology soon.   Gynecologic History Patient's last menstrual period was 01/12/2024 (exact date). Period Cycle (Days): 28 Period Duration (Days): 5 Period Pattern: Regular Menstrual Flow: Heavy Menstrual Control:  (period underwear) Dysmenorrhea: (!) Severe Dysmenorrhea Symptoms: Cramping Contraception/Family planning: abstinence and condoms Sexually active: occasionally Last Pap: 2024. Results were: normal   Obstetric History OB History  Gravida Para Term Preterm AB Living  0 0 0 0 0 0  SAB IAB Ectopic Multiple Live Births  0 0 0 0 0       01/27/2024   10:06 AM 03/17/2023    8:23 AM 10/30/2022    8:08 AM  Depression screen PHQ 2/9  Decreased Interest 1 0 2  Down, Depressed, Hopeless 1 0 2  PHQ - 2 Score 2 0 4  Altered sleeping 3 0 3  Tired, decreased energy 2 0 3  Change in appetite 2 0 3  Feeling bad or failure about yourself  0 0 1  Trouble concentrating 3 0 2  Moving slowly or fidgety/restless 0 0 0  Suicidal thoughts 0 0 0  PHQ-9 Score 12 0 16  Difficult doing work/chores Somewhat difficult Not difficult at all Somewhat difficult     The following portions of the patient's history were reviewed and updated as appropriate: allergies, current medications, past family history, past medical history, past social history, past surgical history, and problem list.  Review of Systems  All other systems reviewed and are negative.   Past medical history, past surgical history, family history and social history were all reviewed and documented in the EPIC chart.  Exam:  Vitals:    01/27/24 1004  BP: 120/82  Pulse: 90  SpO2: 99%  Weight: 182 lb (82.6 kg)  Height: 5' 6.75" (1.695 m)   Body mass index is 28.72 kg/m.  Physical Exam Vitals and nursing note reviewed. Exam conducted with a chaperone present.  Constitutional:      Appearance: Normal appearance. She is normal weight.  HENT:     Head: Normocephalic and atraumatic.  Neck:     Thyroid: No thyroid mass, thyromegaly or thyroid tenderness.  Cardiovascular:     Rate and Rhythm: Regular rhythm.     Heart sounds: Normal heart sounds.  Pulmonary:     Effort: Pulmonary effort is normal.     Breath sounds: Normal breath sounds.  Chest:  Breasts:    Breasts are symmetrical.     Right: Normal. No inverted nipple, mass, nipple discharge, skin change or tenderness.     Left: Normal. No inverted nipple, mass, nipple discharge, skin change or tenderness.  Abdominal:     General: Abdomen is flat. Bowel sounds are normal.     Palpations: Abdomen is soft.  Genitourinary:    General: Normal vulva.     Vagina: Normal. No vaginal discharge, bleeding or lesions.     Cervix: Normal. No discharge or lesion.     Uterus: Normal. Not enlarged and not tender.      Adnexa: Right adnexa normal and left adnexa normal.       Right: No  mass, tenderness or fullness.         Left: No mass, tenderness or fullness.    Lymphadenopathy:     Upper Body:     Right upper body: No axillary adenopathy.     Left upper body: No axillary adenopathy.  Skin:    General: Skin is warm and dry.  Neurological:     Mental Status: She is alert and oriented to person, place, and time.  Psychiatric:        Mood and Affect: Mood normal.        Thought Content: Thought content normal.        Judgment: Judgment normal.      Raynelle Fanning, CMA present for exam  Assessment/Plan:   1. Well woman exam with routine gynecological exam (Primary)   2. Vulvar pain Will try vaginal estrogen, if no improvement in 3 months will send for PFPT -  estradiol (ESTRACE VAGINAL) 0.1 MG/GM vaginal cream; Place 1 g vaginally 3 (three) times a week. As well as a pea sized amount to upper vulva and clitoris  Dispense: 127.5 g; Refill: 3     Kendricks Reap B WHNP-BC 10:49 AM 01/27/2024

## 2024-02-02 DIAGNOSIS — F339 Major depressive disorder, recurrent, unspecified: Secondary | ICD-10-CM | POA: Diagnosis not present

## 2024-02-09 ENCOUNTER — Other Ambulatory Visit: Payer: Self-pay | Admitting: Physician Assistant

## 2024-02-09 DIAGNOSIS — F339 Major depressive disorder, recurrent, unspecified: Secondary | ICD-10-CM | POA: Diagnosis not present

## 2024-02-09 NOTE — Telephone Encounter (Signed)
 Last Fill: 11/19/2023  Eye exam: 02/26/2023 WNL    Labs:  09/28/2023 CBC, CMP, UA are within normal limits.   Next Visit: 02/29/2024  Last Visit: 09/28/2023  [id number] (mixed connective tissue disease)   Current Dose per office note 09/28/2023: Plaquenil 200 mg 1 tablet by mouth twice daily Monday through Friday.   Left message to advise patient she is due to update her PLQ eye exam this month.   Okay to refill Plaquenil?

## 2024-02-10 ENCOUNTER — Other Ambulatory Visit: Payer: Self-pay | Admitting: Internal Medicine

## 2024-02-15 NOTE — Progress Notes (Signed)
 Office Visit Note  Patient: Joanne Wallace             Date of Birth: Nov 29, 1996           MRN: [id number]             PCP: Myrlene Broker, MD Referring: Myrlene Broker, * Visit Date: 02/29/2024 Occupation: @GUAROCC @  Subjective:  Medication monitoring   History of Present Illness: Joanne Wallace is a 28 y.o. female with history of mixed connective tissue disease.  Patient is currently on  Plaquenil 200 mg 1 tablet by mouth twice daily Monday through Friday.  She is tolerating Plaquenil without any side effects and has not had any recent gaps in therapy.  Patient reports that since October 2024 she has been experiencing discomfort in the right hip.  Patient denies any groin pain but states that it is uncomfortable when lifting her leg, practicing yoga, or walking prolonged distances.  Patient states that the right hip pain has been limiting her activity.  Patient states that she will be getting new insurance tomorrow and like to hold off on having updated x-rays or any additional testing until that time.  She will notify us if her symptoms persist or worsen.  She denies any other joint pain or joint swelling at this time.  Patient is scheduled to update her Plaquenil eye examination on 03/02/2024.  She has noticed increased eye dryness and has been using over-the-counter eyedrops for relief.  She has occasional sores in her mouth but no nasal ulcers.  She has not had any symptoms of Raynaud's phenomenon.  She denies any recent rashes.   Activities of Daily Living:  Patient reports morning stiffness for 2 hours.   Patient Reports nocturnal pain.  Difficulty dressing/grooming: Denies Difficulty climbing stairs: Reports Difficulty getting out of chair: Reports Difficulty using hands for taps, buttons, cutlery, and/or writing: Reports  Review of Systems  Constitutional:  Positive for fatigue.  HENT:  Positive for mouth sores, mouth dryness and nose dryness.   Eyes:  Positive for  dryness. Negative for pain.  Respiratory:  Negative for shortness of breath and difficulty breathing.   Cardiovascular:  Negative for chest pain and palpitations.  Gastrointestinal:  Positive for abdominal pain and diarrhea. Negative for blood in stool, constipation, nausea and vomiting.  Endocrine: Negative for increased urination.  Genitourinary:  Negative for involuntary urination.  Musculoskeletal:  Positive for joint pain, gait problem, joint pain, joint swelling and morning stiffness. Negative for myalgias, muscle weakness, muscle tenderness and myalgias.  Skin:  Negative for color change, rash, hair loss and sensitivity to sunlight.  Allergic/Immunologic: Positive for susceptible to infections.  Neurological:  Negative for dizziness and headaches.  Hematological:  Negative for swollen glands.  Psychiatric/Behavioral:  Positive for depressed mood. Negative for sleep disturbance. The patient is nervous/anxious.     PMFS History:  Patient Active Problem List   Diagnosis Date Noted   Restless leg 01/18/2024   Routine general medical examination at a health care facility 08/27/2023   Concentration deficit 03/17/2023   Bilateral knee pain 03/17/2023   Autoimmune disease (HCC) 09/26/2020   Menstrual changes 05/07/2019   Fatigue 05/07/2019    Past Medical History:  Diagnosis Date   Anxiety 2014   Depression 2014   IBS (irritable bowel syndrome)    Mixed connective tissue disease (HCC)    Neuromuscular disorder (HCC) 2021   mixed connective tissue disease    Family History  Problem Relation Age of Onset  Breast cancer Mother    Hypothyroidism Mother    Depression Mother    Obesity Mother    Hypothyroidism Father    Obesity Father    Gout Father    Healthy Brother    Rheum arthritis Paternal Aunt    Osteoarthritis Paternal Aunt    Arthritis Maternal Grandmother    Diabetes Paternal Grandmother    Obesity Paternal Grandfather    Past Surgical History:  Procedure  Laterality Date   HERNIA REPAIR     double   TONSILLECTOMY AND ADENOIDECTOMY     Social History   Social History Narrative   Not on file   Immunization History  Administered Date(s) Administered   Influenza,inj,Quad PF,6+ Mos 10/16/2020, 08/11/2022   Influenza-Unspecified 08/30/2021, 08/26/2023   Moderna Sars-Covid-2 Vaccination 02/09/2020, 03/08/2020, 11/01/2020   Tdap 10/16/2020   Unspecified SARS-COV-2 Vaccination 08/26/2023     Objective: Vital Signs: BP 113/79 (BP Location: Left Arm, Patient Position: Sitting, Cuff Size: Normal)   Pulse 78   Resp 14   Ht 5\' 5"  (1.651 m)   Wt 183 lb 3.2 oz (83.1 kg)   BMI 30.49 kg/m    Physical Exam Vitals and nursing note reviewed.  Constitutional:      Appearance: She is well-developed.  HENT:     Head: Normocephalic and atraumatic.  Eyes:     Conjunctiva/sclera: Conjunctivae normal.  Cardiovascular:     Rate and Rhythm: Normal rate and regular rhythm.     Heart sounds: Normal heart sounds.  Pulmonary:     Effort: Pulmonary effort is normal.     Breath sounds: Normal breath sounds.  Abdominal:     General: Bowel sounds are normal.     Palpations: Abdomen is soft.  Musculoskeletal:     Cervical back: Normal range of motion.  Lymphadenopathy:     Cervical: No cervical adenopathy.  Skin:    General: Skin is warm and dry.     Capillary Refill: Capillary refill takes less than 2 seconds.  Neurological:     Mental Status: She is alert and oriented to person, place, and time.  Psychiatric:        Behavior: Behavior normal.      Musculoskeletal Exam: C-spine, thoracic spine, lumbar spine and good range of motion.  Shoulder joints, elbow joints, wrist joints MCPs, PIPs and DIPs have good range of motion with no synovitis.  Complete fist formation bilaterally.  Hip joints have good range of motion with some anterior hip pain but no tenderness over the trochanteric bursa.  Knee joints have good range of motion with no warmth or  effusion.  Ankle joints have good range of motion with no tenderness or joint swelling  CDAI Exam: CDAI Score: -- Patient Global: --; Provider Global: -- Swollen: --; Tender: -- Joint Exam 02/29/2024   No joint exam has been documented for this visit   There is currently no information documented on the homunculus. Go to the Rheumatology activity and complete the homunculus joint exam.  Investigation: No additional findings.  Imaging: No results found.  Recent Labs: Lab Results  Component Value Date   WBC 5.0 01/18/2024   HGB 14.3 01/18/2024   PLT 249.0 01/18/2024   NA 139 01/18/2024   K 3.9 01/18/2024   CL 108 01/18/2024   CO2 23 01/18/2024   GLUCOSE 89 01/18/2024   BUN 9 01/18/2024   CREATININE 0.66 01/18/2024   BILITOT 0.6 01/18/2024   ALKPHOS 45 01/18/2024   AST 19 01/18/2024  ALT 18 01/18/2024   PROT 7.0 01/18/2024   ALBUMIN 4.2 01/18/2024   CALCIUM 8.7 01/18/2024   GFRAA 144 02/06/2021    Speciality Comments: PLQ EYE EXAM: 02/26/2023 WNL [facility] Follow up in 1 year  PLQ eye exam scheduled for 03/02/2024  Procedures:  No procedures performed Allergies: Latex    Assessment / Plan:     Visit Diagnoses: MCTD (mixed connective tissue disease) (HCC)+ANA,+RNP,+SSA -  Lab work from 09/28/23: Ro+, RNP+, dsDNA negative, complements WNL, ESR WNL. She has not had any signs or symptoms of a flare.  She has clinically been doing well taking Plaquenil 200 mg 1 tablet by mouth twice daily Monday through Friday.  No synovitis was noted on examination today.  She has not had any recent rashes or Raynaud's phenomenon.  She has occasional eye dryness and has been using over-the-counter eyedrops.  She has occasional oral ulcers but no nasal ulcerations.  Overall her symptoms have been manageable with the use of Plaquenil. Future orders for the following lab work were placed today --patient will be getting new insurance tomorrow and would like to have lab work once her  new insurance has taken effect.  Patient will remain on Plaquenil 200 mg 1 tablet by mouth twice daily Monday through Friday.  No medication changes will be made at this time.  She was advised to notify us if she develops signs or symptoms of a flare.  She will follow-up in the office in 5 months or sooner if needed.  Plan: Protein / creatinine ratio, urine, CBC with Differential/Platelet, Anti-DNA antibody, double-stranded, C3 and C4, Sedimentation rate, Comprehensive metabolic panel with GFR  High risk medication use - Plaquenil 200 mg 1 tablet by mouth twice daily Monday through Friday.  CBC and CMP updated on 01/18/24.  PLQ EYE EXAM: 02/26/2023 WNL [facility] Follow up in 1 year PLQ eye exam scheduled for 03/02/2024  - Plan: CBC with Differential/Platelet, Comprehensive metabolic panel with GFR  Pain in both hands: She had a left knee cortisone injection on 04/13/23 and has not had any recurrence of discomfort. No synovitis on examination today.  Pain in right hip: Patient has been experiencing discomfort in the right hip since October 2024.  No injury prior to the onset of symptoms.  No groin pain.  Patient's been experiencing discomfort in the right hip mainly anteriorly in the hip flexors.  No tenderness over the right trochanteric bursa.   Chronic pain of left knee: No warmth or effusion noted.  Pain in both feet: She is not experiencing any increased discomfort in her feet at this time.  Myofascial pain: She has occasional myalgias and muscle tenderness due to myofascial pain.  Patient has been practicing yoga which she finds to be helpful.  Other fatigue: Chronic, stable.   Other medical conditions are listed as follows:   Urinary frequency  Other acne  History of IBS  Seasonal allergies  Anxiety and depression  Family history of gout  Family history of rheumatoid arthritis  Orders: Orders Placed This Encounter  Procedures   Protein / creatinine ratio, urine   CBC  with Differential/Platelet   Anti-DNA antibody, double-stranded   C3 and C4   Sedimentation rate   Comprehensive metabolic panel with GFR   No orders of the defined types were placed in this encounter.   Follow-Up Instructions: Return in about 5 months (around 07/31/2024) for MCTD.   Gearldine Bienenstock, PA-C  Note - This record has been  created using [employer].  Chart creation errors have been sought, but may not always  have been located. Such creation errors do not reflect on  the standard of medical care.

## 2024-02-16 DIAGNOSIS — F339 Major depressive disorder, recurrent, unspecified: Secondary | ICD-10-CM | POA: Diagnosis not present

## 2024-02-23 DIAGNOSIS — F339 Major depressive disorder, recurrent, unspecified: Secondary | ICD-10-CM | POA: Diagnosis not present

## 2024-02-29 ENCOUNTER — Ambulatory Visit: Payer: 59 | Attending: Physician Assistant | Admitting: Physician Assistant

## 2024-02-29 ENCOUNTER — Encounter: Payer: Self-pay | Admitting: Physician Assistant

## 2024-02-29 VITALS — BP 113/79 | HR 78 | Resp 14 | Ht 65.0 in | Wt 183.2 lb

## 2024-02-29 DIAGNOSIS — Z8719 Personal history of other diseases of the digestive system: Secondary | ICD-10-CM | POA: Diagnosis not present

## 2024-02-29 DIAGNOSIS — M79671 Pain in right foot: Secondary | ICD-10-CM

## 2024-02-29 DIAGNOSIS — Z79899 Other long term (current) drug therapy: Secondary | ICD-10-CM

## 2024-02-29 DIAGNOSIS — M7918 Myalgia, other site: Secondary | ICD-10-CM

## 2024-02-29 DIAGNOSIS — L708 Other acne: Secondary | ICD-10-CM | POA: Diagnosis not present

## 2024-02-29 DIAGNOSIS — F419 Anxiety disorder, unspecified: Secondary | ICD-10-CM | POA: Diagnosis not present

## 2024-02-29 DIAGNOSIS — Z8261 Family history of arthritis: Secondary | ICD-10-CM

## 2024-02-29 DIAGNOSIS — R5383 Other fatigue: Secondary | ICD-10-CM | POA: Diagnosis not present

## 2024-02-29 DIAGNOSIS — M79642 Pain in left hand: Secondary | ICD-10-CM

## 2024-02-29 DIAGNOSIS — M351 Other overlap syndromes: Secondary | ICD-10-CM

## 2024-02-29 DIAGNOSIS — M79672 Pain in left foot: Secondary | ICD-10-CM

## 2024-02-29 DIAGNOSIS — G8929 Other chronic pain: Secondary | ICD-10-CM

## 2024-02-29 DIAGNOSIS — J302 Other seasonal allergic rhinitis: Secondary | ICD-10-CM

## 2024-02-29 DIAGNOSIS — M79641 Pain in right hand: Secondary | ICD-10-CM | POA: Diagnosis not present

## 2024-02-29 DIAGNOSIS — M25562 Pain in left knee: Secondary | ICD-10-CM | POA: Diagnosis not present

## 2024-02-29 DIAGNOSIS — R350 Frequency of micturition: Secondary | ICD-10-CM | POA: Diagnosis not present

## 2024-02-29 DIAGNOSIS — Z8269 Family history of other diseases of the musculoskeletal system and connective tissue: Secondary | ICD-10-CM

## 2024-02-29 DIAGNOSIS — M25551 Pain in right hip: Secondary | ICD-10-CM

## 2024-02-29 DIAGNOSIS — F32A Depression, unspecified: Secondary | ICD-10-CM

## 2024-03-02 DIAGNOSIS — Z79899 Other long term (current) drug therapy: Secondary | ICD-10-CM | POA: Diagnosis not present

## 2024-03-08 DIAGNOSIS — F339 Major depressive disorder, recurrent, unspecified: Secondary | ICD-10-CM | POA: Diagnosis not present

## 2024-03-09 ENCOUNTER — Telehealth: Payer: Self-pay | Admitting: Rheumatology

## 2024-03-09 NOTE — Telephone Encounter (Signed)
 Patient advised she is due to update lab work but we can hold off on x-rays until her follow up visit unless she is having any new or worsening joint symptoms. Patient states she is having increased joint pain that is effecting her day to day life. Patient scheduled for 03/15/2024 at 8:30 am.

## 2024-03-09 NOTE — Telephone Encounter (Signed)
 Pt called stating her last visit she did not receive X-Rays due to her insurance kicking. Pt received new insurance and would like to schedule an appointment where she can get xrays and labs drawn. Pt does have a follow up on 08/02/24

## 2024-03-09 NOTE — Telephone Encounter (Signed)
 Patient is due to updated lab work but we can hold off on x-rays until her follow up visit unless she is having any new or worsening joint symptoms

## 2024-03-11 NOTE — Progress Notes (Unsigned)
 Office Visit Note  Patient: Joanne Wallace             Date of Birth: 02/21/1996           MRN: [id number]             PCP: Adelia Homestead, MD Referring: Adelia Homestead, * Visit Date: 03/15/2024 Occupation: @GUAROCC @  Subjective:  Increased joint pain   History of Present Illness: Joanne Wallace is a 28 y.o. female with history of mixed connective tissue disease.  Patient is currently taking Plaquenil 200 mg 1 tablet by mouth twice daily Monday through Friday.  She is tolerating plaquenil without any side effects and has not had any interruptions in therapy.  Patient presents today with ongoing pain in the right hip.  Her symptoms started in October with no identifiable trigger.  Most of her symptoms are in the hip flexor region.  Her symptoms are exacerbated by activity including yoga as well as walking.  Patient states for the past few days she is also been experiencing twinges of pain in the left knee.  She is also having discomfort along the right shoulder blade.  She has tried applying heat which advised temporary relief.  She had some improvement after increasing the dose of Cymbalta but overall the tension and pain has persisted since October 2024.  She denies any current muscle spasms. Patient states that she will be establishing care with a new primary care provider in May 2025.  Patient continues to have persistent bloating and diarrhea.  She denies any unintentional weight loss.    Activities of Daily Living:  Patient reports morning stiffness for 2 hours.   Patient Reports nocturnal pain.  Difficulty dressing/grooming: Denies Difficulty climbing stairs: Reports Difficulty getting out of chair: Reports Difficulty using hands for taps, buttons, cutlery, and/or writing: Reports  Review of Systems  Constitutional:  Positive for fatigue.  HENT:  Positive for mouth sores, mouth dryness and nose dryness.   Eyes:  Positive for dryness. Negative for pain.   Respiratory:  Negative for shortness of breath and difficulty breathing.   Cardiovascular:  Negative for chest pain and palpitations.  Gastrointestinal:  Positive for diarrhea. Negative for blood in stool and constipation.  Endocrine: Positive for increased urination.  Genitourinary:  Negative for painful urination and involuntary urination.  Musculoskeletal:  Positive for joint pain, gait problem, joint pain, joint swelling, myalgias, muscle weakness, morning stiffness, muscle tenderness and myalgias.  Skin:  Positive for sensitivity to sunlight. Negative for color change, rash and hair loss.  Allergic/Immunologic: Positive for susceptible to infections.  Neurological:  Negative for dizziness and headaches.  Hematological:  Negative for swollen glands.  Psychiatric/Behavioral:  Positive for depressed mood. Negative for sleep disturbance. The patient is nervous/anxious.     PMFS History:  Patient Active Problem List   Diagnosis Date Noted   Restless leg 01/18/2024   Routine general medical examination at a health care facility 08/27/2023   Concentration deficit 03/17/2023   Bilateral knee pain 03/17/2023   Autoimmune disease (HCC) 09/26/2020   Menstrual changes 05/07/2019   Fatigue 05/07/2019    Past Medical History:  Diagnosis Date   Anxiety 2014   Depression 2014   IBS (irritable bowel syndrome)    Mixed connective tissue disease (HCC)    Neuromuscular disorder (HCC) 2021   mixed connective tissue disease    Family History  Problem Relation Age of Onset   Breast cancer Mother    Hypothyroidism Mother  Depression Mother    Obesity Mother    Hypothyroidism Father    Obesity Father    Gout Father    Healthy Brother    Rheum arthritis Paternal Aunt    Osteoarthritis Paternal Aunt    Arthritis Maternal Grandmother    Diabetes Paternal Grandmother    Obesity Paternal Grandfather    Past Surgical History:  Procedure Laterality Date   HERNIA REPAIR     double    TONSILLECTOMY AND ADENOIDECTOMY     Social History   Social History Narrative   Not on file   Immunization History  Administered Date(s) Administered   Influenza,inj,Quad PF,6+ Mos 10/16/2020, 08/11/2022   Influenza-Unspecified 08/30/2021, 08/26/2023   Moderna Sars-Covid-2 Vaccination 02/09/2020, 03/08/2020, 11/01/2020   Tdap 10/16/2020   Unspecified SARS-COV-2 Vaccination 08/26/2023     Objective: Vital Signs: BP 122/83 (BP Location: Left Arm, Patient Position: Sitting, Cuff Size: Normal)   Pulse 76   Resp 14   Ht 5\' 5"  (1.651 m)   Wt 187 lb 3.2 oz (84.9 kg)   BMI 31.15 kg/m    Physical Exam Vitals and nursing note reviewed.  Constitutional:      Appearance: She is well-developed.  HENT:     Head: Normocephalic and atraumatic.  Eyes:     Conjunctiva/sclera: Conjunctivae normal.  Cardiovascular:     Rate and Rhythm: Normal rate and regular rhythm.     Heart sounds: Normal heart sounds.  Pulmonary:     Effort: Pulmonary effort is normal.     Breath sounds: Normal breath sounds.  Abdominal:     General: Bowel sounds are normal.     Palpations: Abdomen is soft.  Musculoskeletal:     Cervical back: Normal range of motion.  Lymphadenopathy:     Cervical: No cervical adenopathy.  Skin:    General: Skin is warm and dry.     Capillary Refill: Capillary refill takes less than 2 seconds.  Neurological:     Mental Status: She is alert and oriented to person, place, and time.  Psychiatric:        Behavior: Behavior normal.      Musculoskeletal Exam: C-spine, thoracic spine, and lumbar spine good ROM.  Trapezius muscle tension and tenderness bilaterally, right worse than left.  Shoulder joints, elbow joints, wrist joints, Mcps, PIPs, and DIPs good ROM with no synovitis.  Complete fist formation bilaterally.  Discomfort with ROM of the right hip-tenderness along the hip flexors.  Mild tenderness over the right trochanteric bursa.  Left hip has good ROM with no groin pain.   Knee joints have good ROM with no warmth or effusion.  Ankle joints have good ROM with no tenderness or joint swelling.    CDAI Exam: CDAI Score: -- Patient Global: --; Provider Global: -- Swollen: --; Tender: -- Joint Exam 03/15/2024   No joint exam has been documented for this visit   There is currently no information documented on the homunculus. Go to the Rheumatology activity and complete the homunculus joint exam.  Investigation: No additional findings.  Imaging: XR HIP UNILAT W OR W/O PELVIS 2-3 VIEWS RIGHT Result Date: 03/15/2024 No hip joint narrowing was noted.  No SI joint narrowing or sclerosis was noted. Impression: Unremarkable x-rays of the hip joint.   Recent Labs: Lab Results  Component Value Date   WBC 5.0 01/18/2024   HGB 14.3 01/18/2024   PLT 249.0 01/18/2024   NA 139 01/18/2024   K 3.9 01/18/2024   CL 108 01/18/2024  CO2 23 01/18/2024   GLUCOSE 89 01/18/2024   BUN 9 01/18/2024   CREATININE 0.66 01/18/2024   BILITOT 0.6 01/18/2024   ALKPHOS 45 01/18/2024   AST 19 01/18/2024   ALT 18 01/18/2024   PROT 7.0 01/18/2024   ALBUMIN 4.2 01/18/2024   CALCIUM 8.7 01/18/2024   GFRAA 144 02/06/2021    Speciality Comments: PLQ EYE EXAM: 02/26/2023 WNL [facility] Follow up in 1 year  PLQ eye exam scheduled for 03/02/2024  Procedures:  No procedures performed Allergies: Latex    Assessment / Plan:     Visit Diagnoses: MCTD (mixed connective tissue disease) (HCC)+ANA,+RNP,+SSA - Lab work from 09/28/23: Ro+, RNP+, dsDNA negative, complements WNL, ESR WNL: Patient is taking Plaquenil 200 mg 1 tablet by mouth twice daily Monday through Friday.  She is tolerating Plaquenil without any side effects and has not had any recent gaps in therapy.  No synovitis was noted on examination today.  She has been experiencing increased discomfort in her right hip and intermittent discomfort in the left knee.  No warmth or effusion noted in the left knee. X-rays of the right  hip were obtained today for further evaluation-XR unremarkable. Different treatment options were discussed today.  She has not had any recent rashes or symptoms of Raynaud's phenomenon.  No cervical and Fadden apathy was noted.  She has occasional eye dryness and occasional oral ulcers but no active symptoms at this time.  Plan to obtain the following lab work today for further evaluation.  She will remain on Plaquenil as prescribed.  She was advised to notify us  if she develops any new or worsening symptoms. - Plan: Protein / creatinine ratio, urine, CBC with Differential/Platelet, Anti-DNA antibody, double-stranded, Comprehensive metabolic panel with GFR, Sedimentation rate, C3 and C4, ANA, RNP Antibody, Sjogrens syndrome-A extractable nuclear antibody, C-reactive protein  High risk medication use - Plaquenil 200 mg 1 tablet by mouth twice daily Monday through Friday. PLQ EYE EXAM: 03/02/24 WNL [facility] Follow up in 1 year.  Orders for CBC and CMP released today.   Plan: CBC with Differential/Platelet, Comprehensive metabolic panel with GFR  Pain in both hands: No tenderness or synovitis noted on examination today.  Complete fist formation bilaterally.  Chronic pain of left knee: She had a left knee cortisone injection on 04/13/23 and has not had any recurrence of discomfort.  She is been experiencing twinges of pain in the left knee for the past few days.  No recent injury.  No warmth or effusion noted on exam.  Pain in both feet: She is not experiencing any increased discomfort in her feet at this time.  She is good range of motion of both ankle joints with no tenderness or joint swelling.  Pain in right hip - Patient has been experiencing discomfort in the right hip since October 2024.  No injury prior to the onset of symptoms.  No groin pain.  Most of her discomfort is in the hip flexor region.  Her symptoms are exacerbated by physical activity including yoga and walking.  She has not had any  weakness in the right lower extremity.  She has good range of motion of the right hip on examination today.  Mild tenderness over the right trochanteric bursa.  X-rays of the right hip were obtained today for further evaluation.  X-rays of the right hip are unremarkable.  Results were discussed with the patient today in the office.  Different treatment options were discussed including physical therapy,  injection, muscle relaxer, or a low-dose short prednisone taper. Patient plans on checking with her insurance if she is eligible to try physical therapy.  In the meantime patient has requested a prednisone taper to see if this will alleviate her symptoms and she is having difficulty walking and exercising.- Plan: XR HIP UNILAT W OR W/O PELVIS 2-3 VIEWS RIGHT  Myofascial pain: Patient has trapezius muscle tension and tenderness bilaterally, right greater than left.  She has some tenderness along the scapular border.  Discussed that she may benefit from physical therapy.  Discussed trying pain patches as well as applying heat for symptomatic relief.  Also discussed the option of a muscle relaxer--a prescription for methocarbamol 500 mg 1 tablet daily as needed for muscle spasms was sent to the pharmacy.  Instructions were provided.  Other fatigue: Chronic, stable.    Chronic diarrhea - Chronic diarrhea and bloating.  No unintentional weight loss.  Patient has been taking a probiotic and has tried to make dietary changes but continues to have persistent symptoms.  Referral to GI was placed today for further evaluation.  The following lab work was also obtained.  Plan: Tissue transglutaminase, IgA, Gliadin antibodies, serum  Other medical conditions are listed as follows:   Urinary frequency  Other acne  History of IBS  Seasonal allergies  Anxiety and depression  Family history of gout  Family history of rheumatoid arthritis    Orders: Orders Placed This Encounter  Procedures   XR HIP UNILAT  W OR W/O PELVIS 2-3 VIEWS RIGHT   Protein / creatinine ratio, urine   CBC with Differential/Platelet   Anti-DNA antibody, double-stranded   Comprehensive metabolic panel with GFR   Sedimentation rate   C3 and C4   ANA   RNP Antibody   Sjogrens syndrome-A extractable nuclear antibody   C-reactive protein   Tissue transglutaminase, IgA   Gliadin antibodies, serum   Meds ordered this encounter  Medications   methocarbamol (ROBAXIN) 500 MG tablet    Sig: Take 1 tablet (500 mg total) by mouth daily as needed.    Dispense:  30 tablet    Refill:  0   predniSONE (DELTASONE) 5 MG tablet    Sig: Take 4 tabs po x 4 days, 3  tabs po x 4 days, 2  tabs po x 4 days, 1  tab po x 4 days    Dispense:  40 tablet    Refill:  0      Follow-Up Instructions: Return in about 5 months (around 08/15/2024) for MCTD.   Romayne Clubs, PA-C  Note - This record has been created using Dragon software.  Chart creation errors have been sought, but may not always  have been located. Such creation errors do not reflect on  the standard of medical care.

## 2024-03-15 ENCOUNTER — Ambulatory Visit: Attending: Physician Assistant | Admitting: Physician Assistant

## 2024-03-15 ENCOUNTER — Encounter: Payer: Self-pay | Admitting: Physician Assistant

## 2024-03-15 ENCOUNTER — Ambulatory Visit (INDEPENDENT_AMBULATORY_CARE_PROVIDER_SITE_OTHER)

## 2024-03-15 VITALS — BP 122/83 | HR 76 | Resp 14 | Ht 65.0 in | Wt 187.2 lb

## 2024-03-15 DIAGNOSIS — M351 Other overlap syndromes: Secondary | ICD-10-CM

## 2024-03-15 DIAGNOSIS — K529 Noninfective gastroenteritis and colitis, unspecified: Secondary | ICD-10-CM

## 2024-03-15 DIAGNOSIS — G8929 Other chronic pain: Secondary | ICD-10-CM

## 2024-03-15 DIAGNOSIS — R5383 Other fatigue: Secondary | ICD-10-CM

## 2024-03-15 DIAGNOSIS — M25551 Pain in right hip: Secondary | ICD-10-CM

## 2024-03-15 DIAGNOSIS — F32A Depression, unspecified: Secondary | ICD-10-CM

## 2024-03-15 DIAGNOSIS — R350 Frequency of micturition: Secondary | ICD-10-CM

## 2024-03-15 DIAGNOSIS — F419 Anxiety disorder, unspecified: Secondary | ICD-10-CM

## 2024-03-15 DIAGNOSIS — Z79899 Other long term (current) drug therapy: Secondary | ICD-10-CM | POA: Diagnosis not present

## 2024-03-15 DIAGNOSIS — M7918 Myalgia, other site: Secondary | ICD-10-CM

## 2024-03-15 DIAGNOSIS — M79672 Pain in left foot: Secondary | ICD-10-CM

## 2024-03-15 DIAGNOSIS — Z8269 Family history of other diseases of the musculoskeletal system and connective tissue: Secondary | ICD-10-CM

## 2024-03-15 DIAGNOSIS — M79641 Pain in right hand: Secondary | ICD-10-CM | POA: Diagnosis not present

## 2024-03-15 DIAGNOSIS — M79642 Pain in left hand: Secondary | ICD-10-CM

## 2024-03-15 DIAGNOSIS — M79671 Pain in right foot: Secondary | ICD-10-CM

## 2024-03-15 DIAGNOSIS — J302 Other seasonal allergic rhinitis: Secondary | ICD-10-CM

## 2024-03-15 DIAGNOSIS — Z8261 Family history of arthritis: Secondary | ICD-10-CM

## 2024-03-15 DIAGNOSIS — Z8719 Personal history of other diseases of the digestive system: Secondary | ICD-10-CM

## 2024-03-15 DIAGNOSIS — M25562 Pain in left knee: Secondary | ICD-10-CM

## 2024-03-15 DIAGNOSIS — L708 Other acne: Secondary | ICD-10-CM

## 2024-03-15 MED ORDER — PREDNISONE 5 MG PO TABS: ORAL_TABLET | ORAL | 0 refills | Status: DC

## 2024-03-15 MED ORDER — METHOCARBAMOL 500 MG PO TABS: 500.0000 mg | ORAL_TABLET | Freq: Every day | ORAL | 0 refills | Status: DC | PRN

## 2024-03-15 NOTE — Addendum Note (Signed)
 Addended by: Glori Lard C on: 03/15/2024 11:51 AM   Modules accepted: Orders

## 2024-03-15 NOTE — Progress Notes (Signed)
 CBC WNL

## 2024-03-16 NOTE — Progress Notes (Signed)
 No proteinuria.   CMP WNL ESR WNL CRP WNL Complements WNL

## 2024-03-17 ENCOUNTER — Other Ambulatory Visit: Payer: Self-pay | Admitting: Internal Medicine

## 2024-03-17 LAB — C3 AND C4
C3 Complement: 128 mg/dL (ref 83–193)
C4 Complement: 17 mg/dL (ref 15–57)

## 2024-03-17 LAB — TISSUE TRANSGLUTAMINASE, IGA: (tTG) Ab, IgA: <1 U/mL

## 2024-03-17 LAB — PROTEIN / CREATININE RATIO, URINE
Creatinine, Urine: 46 mg/dL (ref 20–275)
Protein/Creat Ratio: 87 mg/g{creat} (ref 24–184)
Protein/Creatinine Ratio: 0.087 mg/mg{creat} (ref 0.024–0.184)
Total Protein, Urine: 4 mg/dL — ABNORMAL LOW (ref 5–24)

## 2024-03-17 LAB — C-REACTIVE PROTEIN: CRP: <3 mg/L (ref <8.0)

## 2024-03-17 LAB — ANTI-NUCLEAR AB-TITER (ANA TITER): ANA Titer 1: 1:40 {titer} — ABNORMAL HIGH

## 2024-03-17 LAB — COMPREHENSIVE METABOLIC PANEL WITH GFR
AG Ratio: 1.7 (calc) (ref 1.0–2.5)
ALT: 11 U/L (ref 6–29)
AST: 18 U/L (ref 10–30)
Albumin: 4.5 g/dL (ref 3.6–5.1)
Alkaline phosphatase (APISO): 49 U/L (ref 31–125)
BUN: 10 mg/dL (ref 7–25)
CO2: 24 mmol/L (ref 20–32)
Calcium: 8.8 mg/dL (ref 8.6–10.2)
Chloride: 106 mmol/L (ref 98–110)
Creat: 0.69 mg/dL (ref 0.50–0.97)
Globulin: 2.7 g/dL (ref 1.9–3.7)
Glucose, Bld: 83 mg/dL (ref 65–99)
Potassium: 4.4 mmol/L (ref 3.5–5.3)
Sodium: 139 mmol/L (ref 135–146)
Total Bilirubin: 0.4 mg/dL (ref 0.2–1.2)
Total Protein: 7.2 g/dL (ref 6.1–8.1)
eGFR: 120 mL/min/{1.73_m2} (ref >=60)

## 2024-03-17 LAB — CBC WITH DIFFERENTIAL/PLATELET
Absolute Lymphocytes: 1530 {cells}/uL (ref 850–3900)
Absolute Monocytes: 435 {cells}/uL (ref 200–950)
Basophils Absolute: 88 {cells}/uL (ref 0–200)
Basophils Relative: 1.3 %
Eosinophils Absolute: 68 {cells}/uL (ref 15–500)
Eosinophils Relative: 1 %
HCT: 42.2 % (ref 35.0–45.0)
Hemoglobin: 13.7 g/dL (ref 11.7–15.5)
MCH: 30.5 pg (ref 27.0–33.0)
MCHC: 32.5 g/dL (ref 32.0–36.0)
MCV: 94 fL (ref 80.0–100.0)
MPV: 11.5 fL (ref 7.5–12.5)
Monocytes Relative: 6.4 %
Neutro Abs: 4678 {cells}/uL (ref 1500–7800)
Neutrophils Relative %: 68.8 %
Platelets: 279 10*3/uL (ref 140–400)
RBC: 4.49 10*6/uL (ref 3.80–5.10)
RDW: 12 % (ref 11.0–15.0)
Total Lymphocyte: 22.5 %
WBC: 6.8 10*3/uL (ref 3.8–10.8)

## 2024-03-17 LAB — GLIADIN ANTIBODIES, SERUM
Gliadin IgA: <1 U/mL
Gliadin IgG: <1 U/mL

## 2024-03-17 LAB — RNP ANTIBODY: Ribonucleic Protein(ENA) Antibody, IgG: 2.4 AI — AB

## 2024-03-17 LAB — ANA: Anti Nuclear Antibody (ANA): POSITIVE — AB

## 2024-03-17 LAB — ANTI-DNA ANTIBODY, DOUBLE-STRANDED: ds DNA Ab: 2 [IU]/mL

## 2024-03-17 LAB — SEDIMENTATION RATE: Sed Rate: 2 mm/h (ref 0–20)

## 2024-03-17 LAB — SJOGRENS SYNDROME-A EXTRACTABLE NUCLEAR ANTIBODY: SSA (Ro) (ENA) Antibody, IgG: 2.7 AI — AB

## 2024-03-17 NOTE — Progress Notes (Signed)
 RNP antibody remains positive  Ro antibody remains positive  dsDNA is negative.   tTG and gliadin antibodies negative.

## 2024-03-18 NOTE — Progress Notes (Signed)
 ANA remains positive.

## 2024-03-22 DIAGNOSIS — F339 Major depressive disorder, recurrent, unspecified: Secondary | ICD-10-CM | POA: Diagnosis not present

## 2024-03-30 ENCOUNTER — Encounter: Payer: Self-pay | Admitting: Physician Assistant

## 2024-04-12 DIAGNOSIS — F339 Major depressive disorder, recurrent, unspecified: Secondary | ICD-10-CM | POA: Diagnosis not present

## 2024-04-19 DIAGNOSIS — K580 Irritable bowel syndrome with diarrhea: Secondary | ICD-10-CM | POA: Diagnosis not present

## 2024-04-19 DIAGNOSIS — F322 Major depressive disorder, single episode, severe without psychotic features: Secondary | ICD-10-CM | POA: Diagnosis not present

## 2024-04-19 DIAGNOSIS — J3089 Other allergic rhinitis: Secondary | ICD-10-CM | POA: Diagnosis not present

## 2024-04-19 DIAGNOSIS — F411 Generalized anxiety disorder: Secondary | ICD-10-CM | POA: Diagnosis not present

## 2024-05-02 NOTE — Telephone Encounter (Signed)
 Left message to call [facility] Triage at [phone], option [phone].

## 2024-05-03 DIAGNOSIS — F339 Major depressive disorder, recurrent, unspecified: Secondary | ICD-10-CM | POA: Diagnosis not present

## 2024-05-05 ENCOUNTER — Encounter: Payer: Self-pay | Admitting: Nurse Practitioner

## 2024-05-05 ENCOUNTER — Ambulatory Visit: Payer: Self-pay | Admitting: Nurse Practitioner

## 2024-05-05 VITALS — BP 120/80 | HR 72 | Temp 98.3°F | Resp 16

## 2024-05-05 DIAGNOSIS — B9689 Other specified bacterial agents as the cause of diseases classified elsewhere: Secondary | ICD-10-CM

## 2024-05-05 DIAGNOSIS — N760 Acute vaginitis: Secondary | ICD-10-CM

## 2024-05-05 DIAGNOSIS — R350 Frequency of micturition: Secondary | ICD-10-CM | POA: Diagnosis not present

## 2024-05-05 DIAGNOSIS — N898 Other specified noninflammatory disorders of vagina: Secondary | ICD-10-CM

## 2024-05-05 LAB — WET PREP FOR TRICH, YEAST, CLUE

## 2024-05-05 MED ORDER — METRONIDAZOLE 500 MG PO TABS: 500.0000 mg | ORAL_TABLET | Freq: Two times a day (BID) | ORAL | 0 refills | Status: AC

## 2024-05-05 NOTE — Progress Notes (Unsigned)
 Joanne Canard, PA-C [address] [place], [place]  [address] Phone: [phone]   Gastroenterology Consultation  Referring Provider:     Adelia Homestead, * Primary Care Physician:  Duwayne Ginsberg, NP Primary Gastroenterologist:  Joanne Canard, PA-C / Darol Elizabeth, MD  Reason for Consultation:     Chronic diarrhea        HPI:   Joanne Wallace is a 28 y.o. y/o female referred for consultation & management  by Picarra, Emerald G, NP.    New patient.  Referred from rheumatology Jacinta Martinis, PA-C for chronic diarrhea.  Medical history significant for mixed connective tissue disorder, anxiety, depression, IBS.  Currently on Plaquenil .  Had labs through rheumatology 6 weeks ago including celiac labs which were negative.  03/15/24 labs: Normal CBC, and CMP.  Negative celiac screen (negative TTG and gliadin antibodies).  Hgb 13.7.  Normal LFTs.  Normal ESR and CRP.  ANA positive.  Current symptoms: Patient states she has had chronic intermittent lower abdominal cramping and diarrhea since she was in college, for approximately 10 years.  She had flareup of symptoms after she had a food poisoning episode December 2024.  Started with acute nausea, vomiting, and diarrhea.  She is currently having 3-6 loose, watery, and formed stools per day.  Has intermittent lower abdominal cramping which is relieved after bowel movement.  Has increased gas and bloating.  Late rarely has episode of nausea or vomiting.  Denies melena or hematochezia or weight loss.  She admits to weight gain.  Was diagnosed with IBS when she was in college.  She has been under increased stress.  She eats a vegan diet and also has a bland diet.  Family history significant for maternal grandmother who had colon cancer.  No family history of IBD.  Patient admits to alcohol and marijuana use.  Drinks 6-12 drinks of alcohol per week.  Still has gallbladder.  No recent stool studies.  No previous GI evaluation.  Past  Medical History:  Diagnosis Date   Anxiety 2014   Depression 2014   IBS (irritable bowel syndrome)    Mixed connective tissue disease (HCC)    Neuromuscular disorder (HCC) 2021   mixed connective tissue disease    Past Surgical History:  Procedure Laterality Date   HERNIA REPAIR     double   TONSILLECTOMY AND ADENOIDECTOMY      Prior to Admission medications   Medication Sig Start Date End Date Taking? Authorizing Provider  Cyanocobalamin  (B-12 PO) Take by mouth.    [provider]  Cyanocobalamin  (VITAMIN B 12 PO) Take by mouth daily.    [provider]  DULoxetine  (CYMBALTA ) 20 MG capsule Take 2 capsules (40 mg total) by mouth daily. 01/18/24   Adelia Homestead, MD  estradiol  (ESTRACE  VAGINAL) 0.1 MG/GM vaginal cream Place 1 g vaginally 3 (three) times a week. As well as a pea sized amount to upper vulva and clitoris Patient not taking: Reported on 05/05/2024 01/27/24   Chrzanowski, Jami B, NP  hydroxychloroquine  (PLAQUENIL ) 200 MG tablet TAKE 1 TABLET BY MOUTH TWICE DAILY MONDAY THROUGH FRIDAY ONLY 02/09/24   Romayne Clubs, PA-C  methocarbamol  (ROBAXIN ) 500 MG tablet Take 1 tablet (500 mg total) by mouth daily as needed. 03/15/24   Romayne Clubs, PA-C  metroNIDAZOLE  (FLAGYL ) 500 MG tablet Take 1 tablet (500 mg total) by mouth 2 (two) times daily. 05/05/24   Andee Bamberger, NP  Multiple Vitamin (MULTIVITAMIN WITH  MINERALS) TABS tablet Take 1 tablet by mouth daily.    [provider]  Probiotic Product (PROBIOTIC PO) Take by mouth. Probiotic/prebiotic    [provider]  rOPINIRole  (REQUIP ) 0.5 MG tablet TAKE 1 TABLET BY MOUTH AT BEDTIME. 03/21/24   Adelia Homestead, MD  thiamine  (VITAMIN B1) 100 MG tablet Take 1 tablet (100 mg total) by mouth daily. 11/06/22   Adelia Homestead, MD    Family History  Problem Relation Age of Onset   Breast cancer Mother    Hypothyroidism Mother    Depression Mother    Obesity Mother     Hypothyroidism Father    Obesity Father    Gout Father    Healthy Brother    Rheum arthritis Paternal Aunt    Osteoarthritis Paternal Aunt    Arthritis Maternal Grandmother    Diabetes Paternal Grandmother    Obesity Paternal Grandfather    Colon cancer Neg Hx    Rectal cancer Neg Hx      Social History   Tobacco Use   Smoking status: Former    Current packs/day: 0.00    Average packs/day: 0.3 packs/day for 7.0 years (1.8 ttl pk-yrs)    Types: Cigarettes    Start date: 2012    Quit date: 2019    Years since quitting: 6.4   Smokeless tobacco: Never   Tobacco comments:    Only tough when I am around smokers, but I've never had a craving I couldnt handle.  Vaping Use   Vaping status: Former  Substance Use Topics   Alcohol use: Yes    Alcohol/week: 6.0 - 12.0 standard drinks of alcohol    Types: 6 - 12 Standard drinks or equivalent per week    Comment: 1 drink daily   Drug use: Yes    Frequency: 7.0 times per week    Types: Marijuana    Allergies as of 05/06/2024 - Review Complete 05/06/2024  Allergen Reaction Noted   Latex Itching 06/14/2020    Review of Systems:    All systems reviewed and negative except where noted in HPI.   Physical Exam:  BP 118/80   Ht 5\' 5"  (1.651 m)   Wt 187 lb 5 oz (85 kg)   LMP 04/17/2024 (Exact Date)   BMI 31.17 kg/m  Patient's last menstrual period was 04/17/2024 (exact date).  General:   Alert,  Well-developed, well-nourished, pleasant and cooperative in NAD Lungs:  Respirations even and unlabored.  Clear throughout to auscultation.   No wheezes, crackles, or rhonchi. No acute distress. Heart:  Regular rate and rhythm; no murmurs, clicks, rubs, or gallops. Abdomen:  Normal bowel sounds.  No bruits.  Soft, and non-distended without masses, hepatosplenomegaly or hernias noted.  Mild bilateral lower abdominal Tenderness.  No upper abdominal pain or tenderness.  No guarding or rebound tenderness.    Neurologic:  Alert and oriented x3;   grossly normal neurologically. Psych:  Alert and cooperative. Anxious mood and affect.  Imaging Studies: No results found.  Labs: CBC    Component Value Date/Time   WBC 6.8 03/15/2024 0936   RBC 4.49 03/15/2024 0936   HGB 13.7 03/15/2024 0936   HGB 13.9 06/10/2021 1110   HCT 42.2 03/15/2024 0936   HCT 40.9 06/10/2021 1110   PLT 279 03/15/2024 0936   PLT 274 06/10/2021 1110   MCV 94.0 03/15/2024 0936   MCV 89 06/10/2021 1110   MCH 30.5 03/15/2024 0936   MCHC 32.5 03/15/2024 0936  RDW 12.0 03/15/2024 0936   RDW 12.5 06/10/2021 1110   LYMPHSABS 1,404 04/13/2023 1012   EOSABS 68 03/15/2024 0936   BASOSABS 88 03/15/2024 0936    CMP     Component Value Date/Time   NA 139 03/15/2024 0936   NA 140 06/10/2021 1110   K 4.4 03/15/2024 0936   CL 106 03/15/2024 0936   CO2 24 03/15/2024 0936   GLUCOSE 83 03/15/2024 0936   BUN 10 03/15/2024 0936   BUN 6 06/10/2021 1110   CREATININE 0.69 03/15/2024 0936   CALCIUM 8.8 03/15/2024 0936   PROT 7.2 03/15/2024 0936   PROT 7.4 06/14/2020 1646   ALBUMIN 4.2 01/18/2024 1006   ALBUMIN 4.6 06/14/2020 1646   AST 18 03/15/2024 0936   ALT 11 03/15/2024 0936   ALKPHOS 45 01/18/2024 1006   BILITOT 0.4 03/15/2024 0936   BILITOT 0.4 06/14/2020 1646   GFRNONAA 124 02/06/2021 1546   GFRAA 144 02/06/2021 1546    Assessment and Plan:   Veronda Gabor is a 28 y.o. y/o female has been referred for:  1.  Chronic worsening diarrhea; recent celiac labs negative.  CBC, CMP, TSH, CRP and ESR Normal.  Differential for diarrhea includes irritable bowel syndrome, infectious diarrhea, and IBD. - Stool Studies: [facility] GI Targeted 1, C. Diff Toxin PCR, Fecal Calprotectin.  2.  Irritable bowel syndrome, Diarrhea Predominant - Rx Dicyclomine 10mg  1 tablet 3 times daily before meals for abdominal pain and diarrhea. - Stop alcohol which can cause worsening diarrhea.  Follow up 4 weeks with TG.  Joanne Canard, PA-C

## 2024-05-05 NOTE — Progress Notes (Signed)
   Acute Office Visit  Subjective:    Patient ID: Joanne Wallace, female    DOB: 29-Jul-1996, 28 y.o.   MRN: [id number]   HPI 28 y.o. presents today for urinary frequency, itching, discharge and mild odor since last period 5/23. Had blood with wiping once. Using vaginal estrogen for chronic clitoral and cervical pain (vasovagal response with intercourse). Started this in February. Has had some improvement in clitoral sensation. Hesitant to try penetration. Long term partner, declines STD screening.   Patient's last menstrual period was 04/17/2024 (exact date). Period Duration (Days): 5-7 Period Pattern: Regular Menstrual Flow: Heavy, Moderate (heavy on 1st day, moderate) Menstrual Control: Other (Comment) (period underwear) Dysmenorrhea: (!) Severe Dysmenorrhea Symptoms: Cramping, Nausea, Diarrhea, Headache  Review of Systems  Constitutional: Negative.   Genitourinary:  Positive for frequency, vaginal discharge and vaginal pain (Itching). Negative for dysuria, flank pain, genital sores, hematuria and urgency.       Vaginal odor       Objective:     Physical Exam Constitutional:      Appearance: Normal appearance.  Genitourinary:    General: Normal vulva.     Vagina: Vaginal discharge present. No erythema.     Cervix: Normal.     BP 120/80   Pulse 72   Temp 98.3 F (36.8 C) (Oral)   Resp 16   LMP 04/17/2024 (Exact Date)  Wt Readings from Last 3 Encounters:  03/15/24 187 lb 3.2 oz (84.9 kg)  02/29/24 183 lb 3.2 oz (83.1 kg)  01/27/24 182 lb (82.6 kg)        Arvell Birchwood, CMA present as Biomedical engineer.   UA: trace leukocytes, neg nitrites, neg blood, 1+ protein, dark yellow/slightly cloudy. Microscopci: wbc 0-5, rbc none, moderate bacteria, Clue cells present  Wet prep + clue cells (+ odor)  Assessment & Plan:   Problem List Items Addressed This Visit   None Visit Diagnoses       Bacterial vaginosis    -  Primary   Relevant Medications   metroNIDAZOLE  (FLAGYL )  500 MG tablet     Urinary frequency       Relevant Orders   Urinalysis,Complete w/RFL Culture     Vaginal discharge       Relevant Orders   WET PREP FOR TRICH, YEAST, CLUE      Plan: Wet prep positive for clue cells - Flagyl  500 mg BID x 7 days. UA unremarkable, reflex culture pending.   Return if symptoms worsen or fail to improve.    Andee Bamberger DNP, 10:27 AM 05/05/2024

## 2024-05-06 ENCOUNTER — Encounter: Payer: Self-pay | Admitting: Physician Assistant

## 2024-05-06 ENCOUNTER — Ambulatory Visit: Admitting: Physician Assistant

## 2024-05-06 ENCOUNTER — Other Ambulatory Visit: Payer: Self-pay | Admitting: Physician Assistant

## 2024-05-06 VITALS — BP 118/80 | Ht 65.0 in | Wt 187.3 lb

## 2024-05-06 DIAGNOSIS — K529 Noninfective gastroenteritis and colitis, unspecified: Secondary | ICD-10-CM

## 2024-05-06 DIAGNOSIS — R197 Diarrhea, unspecified: Secondary | ICD-10-CM

## 2024-05-06 DIAGNOSIS — K580 Irritable bowel syndrome with diarrhea: Secondary | ICD-10-CM

## 2024-05-06 MED ORDER — DICYCLOMINE HCL 10 MG PO CAPS: 10.0000 mg | ORAL_CAPSULE | Freq: Three times a day (TID) | ORAL | 2 refills | Status: AC

## 2024-05-06 NOTE — Telephone Encounter (Signed)
 Last Fill: 02/09/2024  Eye exam:  03/02/24 WNL  Labs: 03/15/2024 CBC WNL  CMP WNL   Next Visit: 08/02/2024  Last Visit: 03/15/2024  [id number] (mixed connective tissue disease)   Current Dose per office note 03/15/2024: Plaquenil  200 mg 1 tablet by mouth twice daily Monday through Friday.   Okay to refill Plaquenil ?

## 2024-05-06 NOTE — Patient Instructions (Addendum)
 Your provider has requested that you go to the basement level for lab work before leaving today. Press "B" on the elevator. The lab is located at the first door on the left as you exit the elevator.  Your provider has ordered "[facility]" stool testing for you. You have received a kit from our office today containing all necessary supplies to complete this test. Please carefully read the stool collection instructions provided in the kit before opening the accompanying materials. In addition, be sure there is a label providing your full name and date of birth on the "puritan opti-swab" tube that is supplied in the kit (if you do not see a label with this information on your test tube, please make us  aware before test collection!). After completing the test, you should secure the purtian tube into the specimen biohazard bag. The [facility] E-Req sheet (including date and time of specimen collection) should be placed into the outside pocket of the specimen biohazard bag and returned to the [place] lab (basement floor of [facility]) within 3 days of collection. Please make sure to give the specimen to a staff member at the lab. DO NOT leave the specimen on the counter.   If the specimen date and time (can be found in the upper right boxed portion of the sheet) are not filled out on the E-Req sheet, the test will NOT be performed.   We have sent the following medications to your pharmacy for you to pick up at your convenience: Dicyclomine   Follow-up on : 06/07/24 at 1:50 pm with Brigitte Canard, PA-C

## 2024-05-07 LAB — URINE CULTURE
MICRO NUMBER:: 16543496
Result:: NO GROWTH
SPECIMEN QUALITY:: ADEQUATE

## 2024-05-07 LAB — URINALYSIS, COMPLETE W/RFL CULTURE
Bilirubin Urine: NEGATIVE
Glucose, UA: NEGATIVE
Hgb urine dipstick: NEGATIVE
Hyaline Cast: NONE SEEN /LPF
Ketones, ur: NEGATIVE
Nitrites, Initial: NEGATIVE
RBC / HPF: NONE SEEN /HPF (ref 0–2)
Specific Gravity, Urine: 1.019 (ref 1.001–1.035)
pH: 8.5 — ABNORMAL HIGH (ref 5.0–8.0)

## 2024-05-07 LAB — CULTURE INDICATED

## 2024-05-09 ENCOUNTER — Encounter: Payer: Self-pay | Admitting: Internal Medicine

## 2024-05-09 ENCOUNTER — Ambulatory Visit: Payer: Self-pay | Admitting: Nurse Practitioner

## 2024-05-10 DIAGNOSIS — K580 Irritable bowel syndrome with diarrhea: Secondary | ICD-10-CM | POA: Diagnosis not present

## 2024-05-10 DIAGNOSIS — R197 Diarrhea, unspecified: Secondary | ICD-10-CM | POA: Diagnosis not present

## 2024-05-11 ENCOUNTER — Other Ambulatory Visit

## 2024-05-11 DIAGNOSIS — K580 Irritable bowel syndrome with diarrhea: Secondary | ICD-10-CM | POA: Diagnosis not present

## 2024-05-11 DIAGNOSIS — R197 Diarrhea, unspecified: Secondary | ICD-10-CM | POA: Diagnosis not present

## 2024-05-12 ENCOUNTER — Ambulatory Visit: Payer: Self-pay | Admitting: Physician Assistant

## 2024-05-12 LAB — CLOSTRIDIUM DIFFICILE TOXIN B, QUALITATIVE, REAL-TIME PCR: Toxigenic C. Difficile by PCR: NOT DETECTED

## 2024-05-16 ENCOUNTER — Telehealth: Payer: Self-pay | Admitting: Rheumatology

## 2024-05-16 NOTE — Telephone Encounter (Signed)
 Pt would like to know if she could come in for a Cortizone shot in her knee. Pt did not know when her last shot was.

## 2024-05-17 DIAGNOSIS — F339 Major depressive disorder, recurrent, unspecified: Secondary | ICD-10-CM | POA: Diagnosis not present

## 2024-05-19 LAB — CALPROTECTIN: Calprotectin: <5 ug/g

## 2024-05-22 NOTE — Progress Notes (Signed)
 Agree with the assessment and plan as outlined by Brigitte Canard, PA-C.

## 2024-05-24 NOTE — Telephone Encounter (Signed)
 Ok to assess these new concerns. Please change the encounter to a full visit rather than an injection visit.

## 2024-05-25 ENCOUNTER — Ambulatory Visit: Attending: Physician Assistant | Admitting: Physician Assistant

## 2024-05-25 ENCOUNTER — Encounter: Payer: Self-pay | Admitting: Physician Assistant

## 2024-05-25 VITALS — BP 130/81 | HR 91 | Resp 16 | Ht 65.0 in | Wt 189.0 lb

## 2024-05-25 DIAGNOSIS — M79672 Pain in left foot: Secondary | ICD-10-CM

## 2024-05-25 DIAGNOSIS — M7061 Trochanteric bursitis, right hip: Secondary | ICD-10-CM

## 2024-05-25 DIAGNOSIS — F32A Depression, unspecified: Secondary | ICD-10-CM

## 2024-05-25 DIAGNOSIS — Z8261 Family history of arthritis: Secondary | ICD-10-CM

## 2024-05-25 DIAGNOSIS — Z79899 Other long term (current) drug therapy: Secondary | ICD-10-CM

## 2024-05-25 DIAGNOSIS — R5383 Other fatigue: Secondary | ICD-10-CM

## 2024-05-25 DIAGNOSIS — M7918 Myalgia, other site: Secondary | ICD-10-CM

## 2024-05-25 DIAGNOSIS — M79641 Pain in right hand: Secondary | ICD-10-CM | POA: Diagnosis not present

## 2024-05-25 DIAGNOSIS — M542 Cervicalgia: Secondary | ICD-10-CM

## 2024-05-25 DIAGNOSIS — M79671 Pain in right foot: Secondary | ICD-10-CM

## 2024-05-25 DIAGNOSIS — M351 Other overlap syndromes: Secondary | ICD-10-CM | POA: Diagnosis not present

## 2024-05-25 DIAGNOSIS — M25562 Pain in left knee: Secondary | ICD-10-CM

## 2024-05-25 DIAGNOSIS — F419 Anxiety disorder, unspecified: Secondary | ICD-10-CM

## 2024-05-25 DIAGNOSIS — Z8269 Family history of other diseases of the musculoskeletal system and connective tissue: Secondary | ICD-10-CM

## 2024-05-25 DIAGNOSIS — G8929 Other chronic pain: Secondary | ICD-10-CM | POA: Diagnosis not present

## 2024-05-25 DIAGNOSIS — M25551 Pain in right hip: Secondary | ICD-10-CM

## 2024-05-25 DIAGNOSIS — Z8719 Personal history of other diseases of the digestive system: Secondary | ICD-10-CM

## 2024-05-25 DIAGNOSIS — K529 Noninfective gastroenteritis and colitis, unspecified: Secondary | ICD-10-CM

## 2024-05-25 DIAGNOSIS — M79642 Pain in left hand: Secondary | ICD-10-CM

## 2024-05-25 DIAGNOSIS — J302 Other seasonal allergic rhinitis: Secondary | ICD-10-CM

## 2024-05-25 MED ORDER — TRIAMCINOLONE ACETONIDE 40 MG/ML IJ SUSP
40.0000 mg | INTRAMUSCULAR | Status: AC | PRN
  Administered 2024-05-25: 40 mg via INTRA_ARTICULAR

## 2024-05-25 MED ORDER — METHOCARBAMOL 500 MG PO TABS: 500.0000 mg | ORAL_TABLET | Freq: Every day | ORAL | 0 refills | Status: DC | PRN

## 2024-05-25 MED ORDER — LIDOCAINE HCL 1 % IJ SOLN
1.5000 mL | INTRAMUSCULAR | Status: AC | PRN
  Administered 2024-05-25: 1.5 mL

## 2024-05-25 NOTE — Progress Notes (Signed)
 Office Visit Note  Patient: Joanne Wallace             Date of Birth: 07-04-1996           MRN: [id number]             PCP: Cecilia Kevin MATSU, NP Referring: Cecilia Kevin MATSU, NP Visit Date: 05/25/2024 Occupation: @GUAROCC @  Subjective:  Left knee pain  History of Present Illness: Joanne Wallace is a 28 y.o. female with history of mixed connective tissue disease.  Patient is taking Plaquenil  200 mg 1 tablet by mouth twice daily Monday through Friday.  She is tolerating Plaquenil  without any side effects and has not had any recent gaps in therapy.  She has had an increased frequency of sores in her mouth recently.  She denies any recent rashes and has been trying to avoid direct sun exposure.  She denies any symptoms of Raynaud's phenomenon. Patient has had a recurrence of pain and stiffness involving the left knee joint.  She is also been having increased discomfort in the cervical spine and the right hip.  She denies any injury or fall.  Onset of symptoms.  Patient states she recently had an episode of neck pain with radiating pain into the left shoulder.  Patient states that the radiating pain last for about 7 days.  She denies any numbness in the left upper extremity.  She has been taking methocarbamol  for muscle tension and muscle spasms.  Patient also took a prednisone  taper which helped to alleviate the discomfort in her cervical spine and her right hip but her symptoms have started to recur.  Her right hip pain is exacerbated by laying on her side as well as sitting for prolonged periods of time.  She has been unable to be as active as she would like to do the discomfort in her right hip and left knee. The patient was recently at off from her job but will hopefully be starting a new position today.  She is planning to use [place] for insurance for now.   Activities of Daily Living:  Patient reports morning stiffness for 2 hours.   Patient Reports nocturnal pain.  Difficulty  dressing/grooming: Denies Difficulty climbing stairs: Reports Difficulty getting out of chair: Reports Difficulty using hands for taps, buttons, cutlery, and/or writing: Reports  Review of Systems  Constitutional:  Positive for fatigue.  HENT:  Positive for mouth dryness. Negative for mouth sores.   Eyes:  Positive for dryness.  Respiratory:  Negative for shortness of breath.   Cardiovascular:  Negative for chest pain and palpitations.  Gastrointestinal:  Negative for blood in stool, constipation and diarrhea.  Endocrine: Negative.  Negative for increased urination.  Genitourinary: Negative.  Negative for involuntary urination.  Musculoskeletal:  Positive for joint pain, gait problem, joint pain, joint swelling, muscle weakness, morning stiffness and muscle tenderness. Negative for myalgias and myalgias.  Skin:  Positive for rash. Negative for color change, hair loss and sensitivity to sunlight.  Allergic/Immunologic: Negative.  Negative for susceptible to infections.  Neurological:  Negative for dizziness and headaches.  Hematological: Negative.  Negative for swollen glands.  Psychiatric/Behavioral:  Positive for sleep disturbance. Negative for depressed mood. The patient is not nervous/anxious.     PMFS History:  Patient Active Problem List   Diagnosis Date Noted   Restless leg 01/18/2024   Routine general medical examination at a health care facility 08/27/2023   Concentration deficit 03/17/2023   Bilateral knee pain 03/17/2023   Autoimmune  disease (HCC) 09/26/2020   Menstrual changes 05/07/2019   Fatigue 05/07/2019    Past Medical History:  Diagnosis Date   Anxiety 2014   Depression 2014   IBS (irritable bowel syndrome)    Mixed connective tissue disease (HCC)    Neuromuscular disorder (HCC) 2021   mixed connective tissue disease    Family History  Problem Relation Age of Onset   Breast cancer Mother    Hypothyroidism Mother    Depression Mother    Obesity Mother     Hypothyroidism Father    Obesity Father    Gout Father    Healthy Brother    Rheum arthritis Paternal Aunt    Osteoarthritis Paternal Aunt    Arthritis Maternal Grandmother    Diabetes Paternal Grandmother    Obesity Paternal Grandfather    Colon cancer Neg Hx    Rectal cancer Neg Hx    Past Surgical History:  Procedure Laterality Date   HERNIA REPAIR     double   TONSILLECTOMY AND ADENOIDECTOMY     Social History   Social History Narrative   Not on file   Immunization History  Administered Date(s) Administered   Influenza, Seasonal, Injecte, Preservative Fre 08/25/2023   Influenza,inj,Quad PF,6+ Mos 10/16/2020, 08/11/2022   Influenza-Unspecified 08/30/2021, 08/26/2023   Moderna Sars-Covid-2 Vaccination 02/09/2020, 03/08/2020, 11/01/2020   Pfizer(Comirnaty)Fall Seasonal Vaccine 12 years and older 08/25/2023   Tdap 10/16/2020   Unspecified SARS-COV-2 Vaccination 08/26/2023     Objective: Vital Signs: BP 130/81 (BP Location: Left Arm, Patient Position: Sitting, Cuff Size: Normal)   Pulse 91   Resp 16   Ht 5' 5 (1.651 m)   Wt 189 lb (85.7 kg)   LMP 05/24/2024   SpO2 99%   BMI 31.45 kg/m    Physical Exam Vitals and nursing note reviewed.  Constitutional:      Appearance: She is well-developed.  HENT:     Head: Normocephalic and atraumatic.   Eyes:     Conjunctiva/sclera: Conjunctivae normal.    Cardiovascular:     Rate and Rhythm: Normal rate and regular rhythm.     Heart sounds: Normal heart sounds.  Pulmonary:     Effort: Pulmonary effort is normal.     Breath sounds: Normal breath sounds.  Abdominal:     General: Bowel sounds are normal.     Palpations: Abdomen is soft.   Musculoskeletal:     Cervical back: Normal range of motion.  Lymphadenopathy:     Cervical: No cervical adenopathy.   Skin:    General: Skin is warm and dry.     Capillary Refill: Capillary refill takes less than 2 seconds.   Neurological:     Mental Status: She is alert  and oriented to person, place, and time.   Psychiatric:        Behavior: Behavior normal.      Musculoskeletal Exam: C-spine has painful limited range of motion especially with lateral rotation.  Left trapezius muscle tension and tenderness.  Shoulder joints, elbow joints, wrist joints, MCPs, PIPs, DIPs have good range of motion with no synovitis.  Complete fist formation bilaterally.  Hip joints have good range of motion with no groin pain.  Tenderness over the right trochanteric bursa and along the IT band.  Knee joints have good range of motion with discomfort in the left knee.  Mild warmth in the left knee noted.  Ankle joints have good range of motion no tenderness or joint swelling.  CDAI Exam: CDAI  Score: -- Patient Global: --; Provider Global: -- Swollen: --; Tender: -- Joint Exam 05/25/2024   No joint exam has been documented for this visit   There is currently no information documented on the homunculus. Go to the Rheumatology activity and complete the homunculus joint exam.  Investigation: No additional findings.  Imaging: No results found.  Recent Labs: Lab Results  Component Value Date   WBC 6.8 03/15/2024   HGB 13.7 03/15/2024   PLT 279 03/15/2024   NA 139 03/15/2024   K 4.4 03/15/2024   CL 106 03/15/2024   CO2 24 03/15/2024   GLUCOSE 83 03/15/2024   BUN 10 03/15/2024   CREATININE 0.69 03/15/2024   BILITOT 0.4 03/15/2024   ALKPHOS 45 01/18/2024   AST 18 03/15/2024   ALT 11 03/15/2024   PROT 7.2 03/15/2024   ALBUMIN 4.2 01/18/2024   CALCIUM 8.8 03/15/2024   GFRAA 144 02/06/2021    Speciality Comments: PLQ EYE EXAM: 03/02/24 [facility] Follow up in 1 year.    Procedures:  Large Joint Inj: L knee on 05/25/2024 9:53 AM Indications: pain Details: 27 G 1.5 in needle, medial approach  Arthrogram: No  Medications: 1.5 mL lidocaine  1 %; 40 mg triamcinolone  acetonide 40 MG/ML Aspirate: 0 mL Outcome: tolerated well, no immediate  complications Procedure, treatment alternatives, risks and benefits explained, specific risks discussed. Consent was given by the patient. Immediately prior to procedure a time out was called to verify the correct patient, procedure, equipment, support staff and site/side marked as required. Patient was prepped and draped in the usual sterile fashion.    Large Joint Inj: R greater trochanter on 05/25/2024 9:53 AM Indications: pain Details: 27 G 1.5 in needle, lateral approach  Arthrogram: No  Medications: 1.5 mL lidocaine  1 %; 40 mg triamcinolone  acetonide 40 MG/ML Aspirate: 0 mL Outcome: tolerated well, no immediate complications Procedure, treatment alternatives, risks and benefits explained, specific risks discussed. Consent was given by the patient. Immediately prior to procedure a time out was called to verify the correct patient, procedure, equipment, support staff and site/side marked as required. Patient was prepped and draped in the usual sterile fashion.     Allergies: Latex   Assessment / Plan:     Visit Diagnoses: MCTD (mixed connective tissue disease) (HCC)+ANA,+RNP,+SSA: She is clinically doing well taking Plaquenil  200 mg 1 tablet by mouth twice daily Monday through Friday.  She is tolerating Plaquenil  without any side effects and has not had any recent gaps in therapy.  Patient presents today with increased arthralgias involving multiple joints.  She recently had an exacerbation of pain involving the cervical spine, right hip, and left knee joint.  Mild warmth of the left knee was noted today.  After informed consent the left knee joint was injected with cortisone and the right trochanteric bursa was injected with cortisone.  Procedure notes were completed above. She has not had any recent rashes.  No symptoms of Raynaud's phenomenon.  She has occasional oral ulcers.  No nasal ulcers.  She has ongoing sicca symptoms.   Lab work on 03/15/2024 was reviewed today in the office: CBC  within normal limits, double-stranded DNA negative, CMP within normal limits, ESR within normal limits, complements within normal limits, ANA remains positive, RNP remains positive, Ro antibody remains positive, and CRP WNL.  She will remain on Plaquenil  as prescribed.  She was advised to notify us  if she develops any new or worsening symptoms.  Plan to obtain updated lab work at  her next scheduled follow-up visit. She will follow-up in the office in 5 months or sooner if needed.  High risk medication use - Plaquenil  200 mg 1 tablet by mouth twice daily Monday through Friday. CBC and CMP were within normal limits on 03/15/2024.  She will continue to require updated lab work every 5 months. PLQ EYE EXAM: 03/02/24 [facility] Follow up in 1 year.   Pain in both hands: No joint tenderness or synovitis noted on examination today.  Complete fist formation bilaterally.  Chronic pain of left knee: Previously had a left knee joint cortisone injection performed on 04/13/2023 which provided significant relief.  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   Her symptoms have gradually started to recur.  She has noticed increased stiffness as well as some difficulty getting up from a seated position at times.  On examination she has some discomfort with range of motion with mild warmth.  No effusion was noted.  She has not had any recent injury or fall.  No mechanical symptoms.  After informed consent the left knee joint was injected with cortisone today.  She tolerated the procedure well.  Procedure is completed above.  Aftercare was discussed.  She was advised to notify us  if her symptoms persist or worsen.  Pain in both feet: She has good range of motion of both ankle joints with no tenderness or joint swelling.  Pain in right hip: X-rays of the right hip were obtained on 03/15/2024 which were unremarkable.  She presents today with a  recurrence of pain in the right hip.  On examination today she has good range of motion of the right hip joint with no groin pain.  She has tenderness over the right trochanteric bursa and along the IT band.  Different treatment options were discussed today.  After informed consent the right trochanteric bursa was injected with cortisone.  She tolerated the procedure well.  Procedure note was completed above.  Aftercare was discussed.  She was advised to notify us  if she has recurrence of symptoms.  Myofascial pain -She continues to experience intermittent myalgias and muscle tenderness due to fibromyalgia.  She is having increased trapezius muscle tension and tenderness especially on the left side currently.  She is also had a recurrence of right hip pain consistent with trochanteric bursitis.  She has been taking methocarbamol  500 mg 1 tablet daily as needed for symptomatic relief.  A refill of methocarbamol  sent to the pharmacy today.  Once the patient has new insurance and has figured out her work schedule she will notify us  and we can place a referral to integrative therapies.  Other fatigue: Chronic, stable.  Patient plans on increasing her exercise regimen once her right hip and left knee pain improved.  She has a stationary bike at home which she plans on using.  She is also considering getting a [employer] membership for pool access.  Neck pain: Patient recently had exacerbation of neck pain and stiffness.  She was experiencing radiating pain to the left shoulder lasting for about 1 week.  She did not have any numbness at that time.  No upper extremity weakness.  Patient tried taking methocarbamol  which helped to alleviate some of her muscle tension and tenderness but she ended up having to take a prednisone  taper to help alleviate her symptoms.  She has continued to have some stiffness but the radiating pain has resolved.  On  examination today she has some discomfort and limitation with lateral rotation.   Discussed treatment options today.  A refill of methocarbamol  sent to the pharmacy.  She will notify us  when she is ready for referral to physical therapy to be placed.  She was given a handout of home exercises to perform. If her symptoms persist or worsen imaging can be ordered.  Other medical conditions are listed as follows:   History of IBS  Seasonal allergies  Anxiety and depression: She is taking cymbalta  40 mg daily.    Family history of gout  Family history of rheumatoid arthritis  Chronic diarrhea - Referral to GI placed at last visit--She has been started on Bentyl .    Orders: Orders Placed This Encounter  Procedures   Large Joint Inj: L knee   Large Joint Inj: R greater trochanter   Meds ordered this encounter  Medications   methocarbamol  (ROBAXIN ) 500 MG tablet    Sig: Take 1 tablet (500 mg total) by mouth daily as needed.    Dispense:  30 tablet    Refill:  0     Follow-Up Instructions: Return in about 5 months (around 10/25/2024) for MCTD.   Waddell CHRISTELLA Craze, PA-C  Note - This record has been created using Dragon software.  Chart creation errors have been sought, but may not always  have been located. Such creation errors do not reflect on  the standard of medical care.

## 2024-05-25 NOTE — Patient Instructions (Signed)
 Neck Exercises Ask your health care provider which exercises are safe for you. Do exercises exactly as told by your health care provider and adjust them as directed. It is normal to feel mild stretching, pulling, tightness, or discomfort as you do these exercises. Stop right away if you feel sudden pain or your pain gets worse. Do not begin these exercises until told by your health care provider. Neck exercises can be important for many reasons. They can improve strength and maintain flexibility in your neck, which will help your upper back and prevent neck pain. Stretching exercises Rotation neck stretching  Sit in a chair or stand up. Place your feet flat on the floor, shoulder-width apart. Slowly turn your head (rotate) to the right until a slight stretch is felt. Turn it all the way to the right so you can look over your right shoulder. Do not tilt or tip your head. Hold this position for 10-30 seconds. Slowly turn your head (rotate) to the left until a slight stretch is felt. Turn it all the way to the left so you can look over your left shoulder. Do not tilt or tip your head. Hold this position for 10-30 seconds. Repeat __________ times. Complete this exercise __________ times a day. Neck retraction  Sit in a sturdy chair or stand up. Look straight ahead. Do not bend your neck. Use your fingers to push your chin backward (retraction). Do not bend your neck for this movement. Continue to face straight ahead. If you are doing the exercise properly, you will feel a slight sensation in your throat and a stretch at the back of your neck. Hold the stretch for 1-2 seconds. Repeat __________ times. Complete this exercise __________ times a day. Strengthening exercises Neck press  Lie on your back on a firm bed or on the floor with a pillow under your head. Use your neck muscles to push your head down on the pillow and straighten your spine. Hold the position as well as you can. Keep your head  facing up (in a neutral position) and your chin tucked. Slowly count to 5 while holding this position. Repeat __________ times. Complete this exercise __________ times a day. Isometrics These are exercises in which you strengthen the muscles in your neck while keeping your neck still (isometrics). Sit in a supportive chair and place your hand on your forehead. Keep your head and face facing straight ahead. Do not flex or extend your neck while doing isometrics. Push forward with your head and neck while pushing back with your hand. Hold for 10 seconds. Do the sequence again, this time putting your hand against the back of your head. Use your head and neck to push backward against the hand pressure. Finally, do the same exercise on either side of your head, pushing sideways against the pressure of your hand. Repeat __________ times. Complete this exercise __________ times a day. Prone head lifts  Lie face-down (prone position), resting on your elbows so that your chest and upper back are raised. Start with your head facing downward, near your chest. Position your chin either on or near your chest. Slowly lift your head upward. Lift until you are looking straight ahead. Then continue lifting your head as far back as you can comfortably stretch. Hold your head up for 5 seconds. Then slowly lower it to your starting position. Repeat __________ times. Complete this exercise __________ times a day. Supine head lifts  Lie on your back (supine position), bending your knees  to point to the ceiling and keeping your feet flat on the floor. Lift your head slowly off the floor, raising your chin toward your chest. Hold for 5 seconds. Repeat __________ times. Complete this exercise __________ times a day. Scapular retraction  Stand with your arms at your sides. Look straight ahead. Slowly pull both shoulders (scapulae) backward and downward (retraction) until you feel a stretch between your shoulder  blades in your upper back. Hold for 10-30 seconds. Relax and repeat. Repeat __________ times. Complete this exercise __________ times a day. Contact a health care provider if: Your neck pain or discomfort gets worse when you do an exercise. Your neck pain or discomfort does not improve within 2 hours after you exercise. If you have any of these problems, stop exercising right away. Do not do the exercises again unless your health care provider says that you can. Get help right away if: You develop sudden, severe neck pain. If this happens, stop exercising right away. Do not do the exercises again unless your health care provider says that you can. This information is not intended to replace advice given to you by your health care provider. Make sure you discuss any questions you have with your health care provider. Document Revised: 05/14/2021 Document Reviewed: 05/14/2021 Elsevier Patient Education  2024 [employer].

## 2024-05-31 DIAGNOSIS — F339 Major depressive disorder, recurrent, unspecified: Secondary | ICD-10-CM | POA: Diagnosis not present

## 2024-05-31 NOTE — Telephone Encounter (Signed)
 I recommend OV for lab testing when she has insurance squared away (due to cost of labs).

## 2024-06-01 ENCOUNTER — Telehealth: Payer: Self-pay

## 2024-06-01 NOTE — Telephone Encounter (Signed)
 Patient notified & she will schedule an appointment.

## 2024-06-01 NOTE — Telephone Encounter (Signed)
 Patient called the office & was crying. She states she is not due to start her cycle but she is having some spotting rt now & it is around her ovulation time. She has been laid off from her job and her [employer] insurance is suppose to start on 06-15-23. Patient states she will call to schedule an appt once she knows for sure that the insurance is ready for her to use. She stated she was given a cortisone injection on 05-25-24 & is having increase of hot flashes & now the spotting. Patient wanted to know if you thought the cortisone could be making it worse & if she should be concerned or if it is okay to wait until her appt. Please advise.

## 2024-06-01 NOTE — Telephone Encounter (Signed)
 Yes, that can be a side effect. Ok to keep appt as scheduled.

## 2024-06-06 NOTE — Telephone Encounter (Signed)
 Reviewed message above.  Recommend further evaluation by gynecology on 06/21/2024 to get their recommendations going forward in regards to surgical intervention. [place] also has a women's health urgent care if needed prior to her upcoming appointment.

## 2024-06-07 ENCOUNTER — Ambulatory Visit: Admitting: Physician Assistant

## 2024-06-15 ENCOUNTER — Ambulatory Visit: Admitting: Internal Medicine

## 2024-06-21 ENCOUNTER — Ambulatory Visit: Admitting: Radiology

## 2024-06-26 ENCOUNTER — Other Ambulatory Visit: Payer: Self-pay | Admitting: Physician Assistant

## 2024-06-27 NOTE — Telephone Encounter (Signed)
 Last Fill: 05/25/2024  Next Visit: 08/02/2024  Last Visit: 05/25/2024  Dx: Myofascial pain   Current Dose per office note on 05/25/2024: methocarbamol  500 mg 1 tablet daily as needed   Okay to refill Methocarbamol ?

## 2024-07-01 ENCOUNTER — Ambulatory Visit: Admitting: Physician Assistant

## 2024-07-01 ENCOUNTER — Encounter: Payer: Self-pay | Admitting: Radiology

## 2024-07-01 ENCOUNTER — Ambulatory Visit: Admitting: Radiology

## 2024-07-01 VITALS — BP 124/82 | HR 116 | Wt 186.0 lb

## 2024-07-01 DIAGNOSIS — N898 Other specified noninflammatory disorders of vagina: Secondary | ICD-10-CM

## 2024-07-01 DIAGNOSIS — R232 Flushing: Secondary | ICD-10-CM | POA: Diagnosis not present

## 2024-07-01 DIAGNOSIS — R5383 Other fatigue: Secondary | ICD-10-CM

## 2024-07-01 LAB — WET PREP FOR TRICH, YEAST, CLUE

## 2024-07-01 NOTE — Progress Notes (Signed)
      Subjective: Joanne Wallace is a 28 y.o. female who complains of Wants to discuss perimenopause symptoms. Complains of hot flashes, irregular periods, mid-cycle spotting, trouble sleeping. Symptoms began after getting cortisone injection 05/25/24, has noticed heat intolerance x's couple of years. BC: condoms  Also c/o vaginal discharge with odor, unsure if BV is gone. Still unable to tolerate her cervix being touched, interested in a hysterectomy for long term relief.   Review of Systems  All other systems reviewed and are negative.   Past Medical History:  Diagnosis Date   Anxiety 2014   Depression 2014   IBS (irritable bowel syndrome)    Mixed connective tissue disease (HCC)    Neuromuscular disorder (HCC) 2021   mixed connective tissue disease      Objective:  Today's Vitals   07/01/24 1332  BP: 124/82  Pulse: (!) 116  SpO2: 98%  Weight: 186 lb (84.4 kg)   Body mass index is 30.95 kg/m.   Physical Exam Vitals and nursing note reviewed. Exam conducted with a chaperone present.  Constitutional:      Appearance: Normal appearance. She is well-developed.  Pulmonary:     Effort: Pulmonary effort is normal.  Abdominal:     General: Abdomen is flat.     Palpations: Abdomen is soft.  Genitourinary:    General: Normal vulva.     Vagina: Vaginal discharge present. No erythema, bleeding or lesions.     Cervix: Normal. No discharge, friability, lesion or erythema.     Uterus: Normal.      Adnexa: Right adnexa normal and left adnexa normal.  Neurological:     Mental Status: She is alert.  Psychiatric:        Mood and Affect: Mood normal.        Thought Content: Thought content normal.        Judgment: Judgment normal.     Microscopic wet-mount exam shows negative for pathogens, normal epithelial cells.   Chaperone offered and declined for exam  Assessment:/Plan:   1. Hot flashes (Primary) - Estradiol  - Follicle stimulating hormone - Luteinizing  hormone - Thyroid  Panel With TSH - Testos,Total,Free and SHBG (Female) - Prolactin - Comprehensive metabolic panel with GFR - CBC with Differential/Platelet  2. Vaginal odor - WET PREP FOR TRICH, YEAST, CLUE  3. Fatigue, unspecified type - Vitamin D  (25 hydroxy) - Vitamin B12    Will contact patient with results of testing completed today. Avoid intercourse until symptoms are resolved. Safe sex encouraged. Avoid the use of soaps or perfumed products in the peri area. Avoid tub baths and sitting in sweaty or wet clothing for prolonged periods of time.   Return for EB surgical consult hyst.   Eran Windish B, NP 1:45 PM

## 2024-07-06 ENCOUNTER — Ambulatory Visit: Payer: Self-pay | Admitting: Radiology

## 2024-07-06 ENCOUNTER — Ambulatory Visit: Payer: Self-pay | Admitting: Internal Medicine

## 2024-07-06 ENCOUNTER — Encounter: Payer: Self-pay | Admitting: Internal Medicine

## 2024-07-06 VITALS — BP 122/80 | HR 72 | Temp 98.7°F | Ht 65.0 in | Wt 188.0 lb

## 2024-07-06 DIAGNOSIS — R41840 Attention and concentration deficit: Secondary | ICD-10-CM | POA: Diagnosis not present

## 2024-07-06 DIAGNOSIS — N926 Irregular menstruation, unspecified: Secondary | ICD-10-CM

## 2024-07-06 DIAGNOSIS — M359 Systemic involvement of connective tissue, unspecified: Secondary | ICD-10-CM

## 2024-07-06 LAB — COMPREHENSIVE METABOLIC PANEL WITH GFR
AG Ratio: 1.8 (calc) (ref 1.0–2.5)
ALT: 16 U/L (ref 6–29)
AST: 17 U/L (ref 10–30)
Albumin: 4.6 g/dL (ref 3.6–5.1)
Alkaline phosphatase (APISO): 45 U/L (ref 31–125)
BUN: 8 mg/dL (ref 7–25)
CO2: 26 mmol/L (ref 20–32)
Calcium: 9.2 mg/dL (ref 8.6–10.2)
Chloride: 105 mmol/L (ref 98–110)
Creat: 0.64 mg/dL (ref 0.50–0.97)
Globulin: 2.5 g/dL (ref 1.9–3.7)
Glucose, Bld: 100 mg/dL — ABNORMAL HIGH (ref 65–99)
Potassium: 4 mmol/L (ref 3.5–5.3)
Sodium: 138 mmol/L (ref 135–146)
Total Bilirubin: 0.6 mg/dL (ref 0.2–1.2)
Total Protein: 7.1 g/dL (ref 6.1–8.1)
eGFR: 122 mL/min/1.73m2 (ref >=60)

## 2024-07-06 LAB — CBC WITH DIFFERENTIAL/PLATELET
Absolute Lymphocytes: 1791 {cells}/uL (ref 850–3900)
Absolute Monocytes: 612 {cells}/uL (ref 200–950)
Basophils Absolute: 99 {cells}/uL (ref 0–200)
Basophils Relative: 1.1 %
Eosinophils Absolute: 36 {cells}/uL (ref 15–500)
Eosinophils Relative: 0.4 %
HCT: 43.7 % (ref 35.0–45.0)
Hemoglobin: 14.2 g/dL (ref 11.7–15.5)
MCH: 31.3 pg (ref 27.0–33.0)
MCHC: 32.5 g/dL (ref 32.0–36.0)
MCV: 96.5 fL (ref 80.0–100.0)
MPV: 11.3 fL (ref 7.5–12.5)
Monocytes Relative: 6.8 %
Neutro Abs: 6462 {cells}/uL (ref 1500–7800)
Neutrophils Relative %: 71.8 %
Platelets: 315 Thousand/uL (ref 140–400)
RBC: 4.53 Million/uL (ref 3.80–5.10)
RDW: 12.7 % (ref 11.0–15.0)
Total Lymphocyte: 19.9 %
WBC: 9 Thousand/uL (ref 3.8–10.8)

## 2024-07-06 LAB — LUTEINIZING HORMONE: LH: 5.5 m[IU]/mL

## 2024-07-06 LAB — TESTOS,TOTAL,FREE AND SHBG (FEMALE)
Free Testosterone: 3.1 pg/mL (ref 0.1–6.4)
Sex Hormone Binding: 45 nmol/L (ref 17–124)
Testosterone, Total, LC-MS-MS: 31 ng/dL (ref 2–45)

## 2024-07-06 LAB — VITAMIN B12: Vitamin B-12: 357 pg/mL (ref 200–1100)

## 2024-07-06 LAB — THYROID PANEL WITH TSH
Free Thyroxine Index: 2.2 (ref 1.4–3.8)
T3 Uptake: 27 % (ref 22–35)
T4, Total: 8.2 ug/dL (ref 5.1–11.9)
TSH: 0.8 m[IU]/L

## 2024-07-06 LAB — PROLACTIN: Prolactin: 4.9 ng/mL

## 2024-07-06 LAB — ESTRADIOL: Estradiol: 165 pg/mL

## 2024-07-06 LAB — VITAMIN D 25 HYDROXY (VIT D DEFICIENCY, FRACTURES): Vit D, 25-Hydroxy: 32 ng/mL (ref 30–100)

## 2024-07-06 LAB — FOLLICLE STIMULATING HORMONE: FSH: 4.4 m[IU]/mL

## 2024-07-06 NOTE — Progress Notes (Signed)
 "  Subjective:   Patient ID: Joanne Wallace, female    DOB: Jun 09, 1996, 28 y.o.   MRN: [id number]  Discussed the use of AI scribe software for clinical note transcription with the patient, who gave verbal consent to proceed.  History of Present Illness [name] is a 28 year old female who presents with chronic pain and menstrual irregularities.  She experiences significant pain radiating down her leg and across her back, which was temporarily alleviated by a cortisone injection. However, she continues to have persistent hip pain. Attempts to use a stationary bike for exercise exacerbated her knee pain, leading to discontinuation. She is concerned about weight gain, which she attributes to her inability to maintain consistent physical activity due to pain flare-ups. She uses methocarbamol  for pain relief, as over-the-counter medications like ibuprofen  and Aleve  have been ineffective. She is cautious about potential dependency on methocarbamol .  She has a history of menstrual irregularities, including early and heavy periods, and ovulation bleeding. Her cycle has been irregular, with periods starting earlier than expected. She experiences significant pain during ovulation, particularly on the left side. Hormone levels have been checked and are normal, but she continues to experience symptoms she attributes to hormonal disturbances.  She reports a family history of diabetes, with her grandmother having been prediabetic. Her recent blood glucose level was slightly elevated, but she attributes this to having eaten prior to the test. She is mindful of her diet, being vegan, and is considering reintroducing certain foods like butter, while avoiding dairy due to its inflammatory effects.  She describes a significant impact on her sexual health, with pain and full body tremors occurring during intercourse, leading to a decreased desire for sexual activity.  Review of Systems  Constitutional:   Positive for activity change.  HENT: Negative.    Eyes: Negative.   Respiratory:  Negative for cough, chest tightness and shortness of breath.   Cardiovascular:  Negative for chest pain, palpitations and leg swelling.  Gastrointestinal:  Negative for abdominal distention, abdominal pain, constipation, diarrhea, nausea and vomiting.  Genitourinary:  Positive for dyspareunia and pelvic pain.  Musculoskeletal:  Positive for arthralgias and myalgias.  Skin: Negative.   Neurological: Negative.   Psychiatric/Behavioral: Negative.      Objective:  Physical Exam Constitutional:      Appearance: She is well-developed.  HENT:     Head: Normocephalic and atraumatic.  Cardiovascular:     Rate and Rhythm: Normal rate and regular rhythm.  Pulmonary:     Effort: Pulmonary effort is normal. No respiratory distress.     Breath sounds: Normal breath sounds. No wheezing or rales.  Abdominal:     General: Bowel sounds are normal. There is no distension.     Palpations: Abdomen is soft.     Tenderness: There is no abdominal tenderness. There is no rebound.  Musculoskeletal:        General: Tenderness present.     Cervical back: Normal range of motion.  Skin:    General: Skin is warm and dry.  Neurological:     Mental Status: She is alert and oriented to person, place, and time.     Coordination: Coordination normal.     Vitals:   07/06/24 0944  BP: 122/80  Pulse: 72  Temp: 98.7 F (37.1 C)  TempSrc: Oral  SpO2: 99%  Weight: 188 lb (85.3 kg)  Height: 5' 5 (1.651 m)    Assessment and Plan Assessment & Plan Chronic hip, neck, and shoulder pain  She experiences chronic severe pain in her hip, neck, and shoulder, with previous treatments proving ineffective. Methocarbamol  provides relief but is not suitable for long-term use. Physical therapy has been beneficial but is costly, and dry needling is recommended but cost-prohibitive. A TENS unit is suggested as a non-invasive option. Refer  her to physical therapy for an integrative treatment approach. Recommend purchasing a TENS unit for home use to manage pain. Encourage continuation of bursitis stretches and exercises.  Abnormal uterine bleeding with ovulatory dysfunction   She has abnormal uterine bleeding with normal hormone levels. A surgical consultation is recommended, with discussions on cervix removal and ovary preservation to alleviate symptoms while avoiding full menopause. Maintain her appointment for a surgical consultation with a gynecologist. Discuss potential surgical options, including cervix removal and ovary preservation, with the gynecologist.  Obesity   Weight gain is attributed to pain-related physical limitations. Previous diet management was challenging due to eating disorders. Her current vegan diet focuses on moderation. Dietary changes were discussed, including reintroducing real butter, considering inflammatory responses. Encourage a balanced diet with moderation, avoiding extreme dietary restrictions. Consider reintroducing real butter in moderation, monitoring for inflammatory response.  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 Surgical intervention was discussed to alleviate pain and improve function. Counseling is ongoing for relationship dynamics. Continue counseling to address relationship dynamics and sexual dysfunction. Discuss potential surgical options with a gynecologist to alleviate pelvic pain.   "

## 2024-07-07 NOTE — Assessment & Plan Note (Signed)
 Refer for PT and recommended TENs unit.

## 2024-07-07 NOTE — Assessment & Plan Note (Signed)
 Discussed today and she is in counseling as well to help and taking cymbalta  40 mg daily.

## 2024-07-07 NOTE — Assessment & Plan Note (Signed)
 She is working through this with gyn and may desire hysterectomy to alleviate her significant symptoms.

## 2024-07-13 ENCOUNTER — Encounter: Payer: Self-pay | Admitting: Internal Medicine

## 2024-07-15 MED ORDER — OXYBUTYNIN CHLORIDE 5 MG PO TABS: 5.0000 mg | ORAL_TABLET | Freq: Three times a day (TID) | ORAL | 0 refills | Status: AC | PRN

## 2024-07-19 NOTE — Progress Notes (Deleted)
 Office Visit Note  Patient: Joanne Wallace             Date of Birth: 06/10/1996           MRN: [id number]             PCP: Rollene Almarie LABOR, MD Referring: Rollene Almarie LABOR, * Visit Date: 08/02/2024 Occupation: @GUAROCC @  Subjective:  No chief complaint on file.   History of Present Illness: Joanne Wallace is a 28 y.o. female ***     Activities of Daily Living:  Patient reports morning stiffness for *** {minute/hour:19697}.   Patient {ACTIONS;DENIES/REPORTS:21021675::Denies} nocturnal pain.  Difficulty dressing/grooming: {ACTIONS;DENIES/REPORTS:21021675::Denies} Difficulty climbing stairs: {ACTIONS;DENIES/REPORTS:21021675::Denies} Difficulty getting out of chair: {ACTIONS;DENIES/REPORTS:21021675::Denies} Difficulty using hands for taps, buttons, cutlery, and/or writing: {ACTIONS;DENIES/REPORTS:21021675::Denies}  No Rheumatology ROS completed.   PMFS History:  Patient Active Problem List   Diagnosis Date Noted   Restless leg 01/18/2024   Routine general medical examination at a health care facility 08/27/2023   Concentration deficit 03/17/2023   Bilateral knee pain 03/17/2023   Autoimmune disease (HCC) 09/26/2020   Menstrual changes 05/07/2019   Fatigue 05/07/2019    Past Medical History:  Diagnosis Date   Anxiety 2014   Depression 2014   IBS (irritable bowel syndrome)    Mixed connective tissue disease (HCC)    Neuromuscular disorder (HCC) 2021   mixed connective tissue disease    Family History  Problem Relation Age of Onset   Breast cancer Mother    Hypothyroidism Mother    Depression Mother    Obesity Mother    Hypothyroidism Father    Obesity Father    Gout Father    Healthy Brother    Rheum arthritis Paternal Aunt    Osteoarthritis Paternal Aunt    Arthritis Maternal Grandmother    Diabetes Paternal Grandmother    Obesity Paternal Grandfather    Colon cancer Neg Hx    Rectal cancer Neg Hx    Past Surgical History:   Procedure Laterality Date   HERNIA REPAIR     double   TONSILLECTOMY AND ADENOIDECTOMY     Social History   Social History Narrative   Not on file   Immunization History  Administered Date(s) Administered   Influenza, Seasonal, Injecte, Preservative Fre 08/25/2023   Influenza,inj,Quad PF,6+ Mos 10/16/2020, 08/11/2022   Influenza-Unspecified 08/30/2021, 08/26/2023   Moderna Sars-Covid-2 Vaccination 02/09/2020, 03/08/2020, 11/01/2020   Pfizer(Comirnaty)Fall Seasonal Vaccine 12 years and older 08/25/2023   Tdap 10/16/2020   Unspecified SARS-COV-2 Vaccination 08/26/2023     Objective: Vital Signs: There were no vitals taken for this visit.   Physical Exam   Musculoskeletal Exam: ***  CDAI Exam: CDAI Score: -- Patient Global: --; Provider Global: -- Swollen: --; Tender: -- Joint Exam 08/02/2024   No joint exam has been documented for this visit   There is currently no information documented on the homunculus. Go to the Rheumatology activity and complete the homunculus joint exam.  Investigation: No additional findings.  Imaging: No results found.  Recent Labs: Lab Results  Component Value Date   WBC 9.0 07/01/2024   HGB 14.2 07/01/2024   PLT 315 07/01/2024   NA 138 07/01/2024   K 4.0 07/01/2024   CL 105 07/01/2024   CO2 26 07/01/2024   GLUCOSE 100 (H) 07/01/2024   BUN 8 07/01/2024   CREATININE 0.64 07/01/2024   BILITOT 0.6 07/01/2024   ALKPHOS 45 01/18/2024   AST 17 07/01/2024   ALT 16 07/01/2024   PROT 7.1  07/01/2024   ALBUMIN 4.2 01/18/2024   CALCIUM 9.2 07/01/2024   GFRAA 144 02/06/2021    Speciality Comments: PLQ EYE EXAM: 03/02/24 [facility] Follow up in 1 year.    Procedures:  No procedures performed Allergies: Latex   Assessment / Plan:     Visit Diagnoses: MCTD (mixed connective tissue disease) (HCC)+ANA,+RNP,+SSA  High risk medication use  Pain in both hands  Chronic pain of left knee  Pain in both feet  Pain in right  hip  Myofascial pain  Other fatigue  History of IBS  Seasonal allergies  Anxiety and depression  Family history of gout  Family history of rheumatoid arthritis  Chronic diarrhea  Trochanteric bursitis, right hip  Orders: No orders of the defined types were placed in this encounter.  No orders of the defined types were placed in this encounter.   Face-to-face time spent with patient was *** minutes. Greater than 50% of time was spent in counseling and coordination of care.  Follow-Up Instructions: No follow-ups on file.   Waddell CHRISTELLA Craze, PA-C  Note - This record has been created using Dragon software.  Chart creation errors have been sought, but may not always  have been located. Such creation errors do not reflect on  the standard of medical care.

## 2024-07-21 ENCOUNTER — Ambulatory Visit: Admitting: Obstetrics and Gynecology

## 2024-07-21 ENCOUNTER — Encounter: Payer: Self-pay | Admitting: Obstetrics and Gynecology

## 2024-07-21 VITALS — BP 118/70 | HR 90 | Wt 190.0 lb

## 2024-07-21 DIAGNOSIS — N921 Excessive and frequent menstruation with irregular cycle: Secondary | ICD-10-CM | POA: Diagnosis not present

## 2024-07-21 DIAGNOSIS — N920 Excessive and frequent menstruation with regular cycle: Secondary | ICD-10-CM | POA: Diagnosis not present

## 2024-07-21 DIAGNOSIS — N938 Other specified abnormal uterine and vaginal bleeding: Secondary | ICD-10-CM

## 2024-07-21 DIAGNOSIS — N946 Dysmenorrhea, unspecified: Secondary | ICD-10-CM | POA: Diagnosis not present

## 2024-07-21 DIAGNOSIS — R1032 Left lower quadrant pain: Secondary | ICD-10-CM

## 2024-07-27 NOTE — Progress Notes (Signed)
 Acute Office Visit  Subjective:    Patient ID: Joanne Wallace, female    DOB: [birth year], 28 y.o.   MRN: [id number]   HPI 27 y.o. presents today for Consult (Concerns about perimenopause. Would like to talk about having a hysterectomy /PAP-01/16/23/Using estradiol  ) .  Patient's last menstrual period was 07/11/2024 (exact date). Period Cycle (Days): 28 Period Duration (Days): 5 Period Pattern: (!) Irregular Menstrual Flow: Heavy Menstrual Control: Other (Comment) (period panties) Dysmenorrhea: (!) Severe Dysmenorrhea Symptoms: Cramping, Nausea, Headache, Diarrhea  Patient is a surgical consult from [facility]. Patient with severe debilitating periods when she has them. Has heavy periods and can bleed twice a month or longer. She has tried ocp's in the past with no resolution or improvement of her symptoms. Has dyspareunia and her and her partner would not like to have any children. Her family has been aware of this as well for a long while that she did not want kids and has medical issues and does not want to have complications in an undesired pregnancy. Periods and the pain have affected her quality of life. Patient counseled that the hysterectomy is permanent and she will no longer be able to conceive, and she voiced understanding. Patient also with vagal responses with any cervical exam or during intercourse. She avoids intercourse because of the discomfort. GYN exams are extremely difficult as well and she feels removing the cervix with the hysterectomy will be beneficial. Patient counseled her pain could still continue, even with a hysterectomy and she is willing to take the risk.  Review of Systems     Objective:    OBGyn Exam  BP 118/70   Pulse 90   Wt 190 lb (86.2 kg)   LMP 07/11/2024 (Exact Date)   SpO2 98%   BMI 31.62 kg/m  Wt Readings from Last 3 Encounters:  07/21/24 190 lb (86.2 kg)  07/06/24 188 lb (85.3 kg)  07/01/24 186 lb (84.4 kg)       OB History      Gravida  0   Para  0   Term  0   Preterm  0   AB  0   Living  0      SAB  0   IAB  0   Ectopic  0   Multiple  0   Live Births  0          Past Surgical History:  Procedure Laterality Date   HERNIA REPAIR     double   TONSILLECTOMY AND ADENOIDECTOMY     Past Medical History:  Diagnosis Date   Anxiety 2014   Depression 2014   IBS (irritable bowel syndrome)    Mixed connective tissue disease (HCC)    Neuromuscular disorder (HCC) 2021   mixed connective tissue disease   Social History   Socioeconomic History   Marital status: Single    Spouse name: Not on file   Number of children: Not on file   Years of education: Not on file   Highest education level: Associate degree: occupational, Scientist, product/process development, or vocational program  Occupational History   Not on file  Tobacco Use   Smoking status: Former    Current packs/day: 0.00    Average packs/day: 0.3 packs/day for 7.0 years (1.8 ttl pk-yrs)    Types: Cigarettes    Start date: 2012    Quit date: 2019    Years since quitting: 6.6   Smokeless tobacco: Never   Tobacco comments:  Only tough when I am around smokers, but I've never had a craving I couldnt handle.  Vaping Use   Vaping status: Former  Substance and Sexual Activity   Alcohol use: Yes    Alcohol/week: 6.0 - 12.0 standard drinks of alcohol    Types: 6 - 12 Standard drinks or equivalent per week    Comment: 1 drink daily   Drug use: Yes    Frequency: 7.0 times per week    Types: Marijuana   Sexual activity: Yes    Partners: Male    Birth control/protection: Condom    Comment: menarche 28yo, sexual debut 28yo  Other Topics Concern   Not on file  Social History Narrative   Not on file   Social Drivers of Health   Financial Resource Strain: Medium Risk (01/14/2024)   Overall Financial Resource Strain (CARDIA)    Difficulty of Paying Living Expenses: Somewhat hard  Food Insecurity: Food Insecurity Present (01/14/2024)   Hunger Vital  Sign    Worried About Running Out of Food in the Last Year: Sometimes true    Ran Out of Food in the Last Year: Sometimes true  Transportation Needs: Unmet Transportation Needs (01/14/2024)   PRAPARE - Transportation    Lack of Transportation (Medical): Yes    Lack of Transportation (Non-Medical): Yes  Physical Activity: Insufficiently Active (01/14/2024)   Exercise Vital Sign    Days of Exercise per Week: 3 days    Minutes of Exercise per Session: 30 min  Stress: Stress Concern Present (01/14/2024)   [employer] of Occupational Health - Occupational Stress Questionnaire    Feeling of Stress : Rather much  Social Connections: Moderately Isolated (01/14/2024)   Social Connection and Isolation Panel    Frequency of Communication with Friends and Family: Twice a week    Frequency of Social Gatherings with Friends and Family: Twice a week    Attends Religious Services: Never    Diplomatic Services operational officer: No    Attends Engineer, structural: Not on file    Marital Status: Living with partner  Intimate Partner Violence: Not on file   Family History  Problem Relation Age of Onset   Breast cancer Mother    Hypothyroidism Mother    Depression Mother    Obesity Mother    Hypothyroidism Father    Obesity Father    Gout Father    Healthy Brother    Rheum arthritis Paternal Aunt    Osteoarthritis Paternal Aunt    Arthritis Maternal Grandmother    Diabetes Paternal Grandmother    Obesity Paternal Grandfather    Colon cancer Neg Hx    Rectal cancer Neg Hx    Current Outpatient Medications on File Prior to Visit  Medication Sig Dispense Refill   Cyanocobalamin  (B-12 PO) Take by mouth.     DULoxetine  (CYMBALTA ) 20 MG capsule Take 2 capsules (40 mg total) by mouth daily. 180 capsule 3   methocarbamol  (ROBAXIN ) 500 MG tablet TAKE 1 TABLET BY MOUTH DAILY AS NEEDED. 30 tablet 0   Multiple Vitamin (MULTIVITAMIN WITH MINERALS) TABS tablet Take 1 tablet by mouth daily.      oxybutynin  (DITROPAN ) 5 MG tablet Take 1 tablet (5 mg total) by mouth every 8 (eight) hours as needed for bladder spasms. (Patient not taking: Reported on 08/03/2024) 30 tablet 0   Probiotic Product (PROBIOTIC PO) Take by mouth. Probiotic/prebiotic     thiamine  (VITAMIN B1) 100 MG tablet Take 1 tablet (100 mg  total) by mouth daily. 90 tablet 3   Cyanocobalamin  (VITAMIN B 12 PO) Take by mouth daily. (Patient not taking: Reported on 08/03/2024)     dicyclomine  (BENTYL ) 10 MG capsule Take 1 capsule (10 mg total) by mouth 4 (four) times daily -  before meals and at bedtime. (Patient not taking: Reported on 08/03/2024) 90 capsule 2   estradiol  (ESTRACE  VAGINAL) 0.1 MG/GM vaginal cream Place 1 g vaginally 3 (three) times a week. As well as a pea sized amount to upper vulva and clitoris (Patient not taking: Reported on 08/03/2024) 127.5 g 3   metroNIDAZOLE  (FLAGYL ) 500 MG tablet Take 1 tablet (500 mg total) by mouth 2 (two) times daily. (Patient not taking: Reported on 08/03/2024) 14 tablet 0   rOPINIRole  (REQUIP ) 0.5 MG tablet TAKE 1 TABLET BY MOUTH AT BEDTIME. (Patient not taking: Reported on 08/03/2024) 30 tablet 1   No current facility-administered medications on file prior to visit.    Assessment & Plan:  Surgical consult Dysmenorrhea DUB Cervical pain Dyspareunia Difficulty with pelvic exams Desires definitive management with [facility] Likely endometriosis  Counseled on all options.  She would like to have the [facility].  Counseled extensively on the procedure including but not limited to what to expect and risks and benefits.  Counseled on postop care and pelvic rest for 10 weeks after the surgery with restricted lifting for 6 weeks after.  Counseled on the benefits of the robotic procedure with faster return to daily activities, improved outcomes, and less risk for complications.  Discussed risk of still having pain in the future and risk for additional surgeries in the future, if endometriosis is found.  Discussed removal of as much lesions as possible, if in a safe areas.  She again voiced that she understands the hysterectomy is permanent and she cannot conceive in the future and wanted to proceed with the procedure. To send a PA for surgery.  To have a PUS done before surgery.  30 minutes spent on reviewing records, imaging,  and one on one patient time and counseling patient and documentation   [name]

## 2024-07-28 NOTE — Progress Notes (Unsigned)
 Office Visit Note  Patient: Joanne Wallace             Date of Birth: September 01, 1996           MRN: [id number]             PCP: Rollene Almarie LABOR, MD Referring: Rollene Almarie LABOR, * Visit Date: 08/03/2024 Occupation: @GUAROCC @  Subjective:  Medication monitoring  History of Present Illness: Joanne Wallace is a 28 y.o. female with history of mixed connective tissue disease.  Patient remains on Plaquenil  200 mg 1 tablet by mouth twice daily Monday through Friday.  She is tolerating Plaquenil  without any side effects and has not had any recent gaps in therapy.  She denies any signs or symptoms of a flare.  Patient states that her left knee joint pain has improved since having a cortisone injection performed on 05/25/2024.  Patient states that the discomfort in the right trochanteric bursa has not improved.  Patient is currently awaiting a referral to physical therapy placed by her PCP.  Patient has been using a TENS unit at home and has been taking methocarbamol  as needed for symptomatic relief.  Patient states that she had a reaction after the cortisone injections to the point that she was having what she would consider perimenopausal symptoms.  Patient states that she is currently in the process of undergoing a workup to proceed with a hysterectomy. Patient continues to have chronic sicca symptoms as well as occasional oral ulcers.  She denies any recent rashes.      Activities of Daily Living:  Patient reports morning stiffness for 2-3 hours.   Patient Reports nocturnal pain.  Difficulty dressing/grooming: Reports Difficulty climbing stairs: Reports Difficulty getting out of chair: Reports Difficulty using hands for taps, buttons, cutlery, and/or writing: Reports  Review of Systems  Constitutional:  Positive for fatigue.  HENT:  Positive for mouth sores and mouth dryness.   Eyes:  Positive for dryness.  Respiratory:  Negative for shortness of breath.   Cardiovascular:  Negative  for chest pain and palpitations.  Gastrointestinal:  Positive for constipation and diarrhea. Negative for blood in stool.  Endocrine: Positive for increased urination.  Genitourinary:  Negative for involuntary urination.  Musculoskeletal:  Positive for joint pain, gait problem, joint pain, joint swelling, myalgias, muscle weakness, morning stiffness, muscle tenderness and myalgias.  Skin:  Positive for sensitivity to sunlight. Negative for color change, rash and hair loss.  Allergic/Immunologic: Positive for susceptible to infections.  Neurological:  Negative for dizziness and headaches.  Hematological:  Negative for swollen glands.  Psychiatric/Behavioral:  Positive for depressed mood and sleep disturbance. The patient is nervous/anxious.     PMFS History:  Patient Active Problem List   Diagnosis Date Noted   Restless leg 01/18/2024   Routine general medical examination at a health care facility 08/27/2023   Concentration deficit 03/17/2023   Bilateral knee pain 03/17/2023   Autoimmune disease (HCC) 09/26/2020   Menstrual changes 05/07/2019   Fatigue 05/07/2019    Past Medical History:  Diagnosis Date   Anxiety 2014   Depression 2014   IBS (irritable bowel syndrome)    Mixed connective tissue disease (HCC)    Neuromuscular disorder (HCC) 2021   mixed connective tissue disease    Family History  Problem Relation Age of Onset   Breast cancer Mother    Hypothyroidism Mother    Depression Mother    Obesity Mother    Hypothyroidism Father    Obesity Father  Gout Father    Healthy Brother    Rheum arthritis Paternal Aunt    Osteoarthritis Paternal Aunt    Arthritis Maternal Grandmother    Diabetes Paternal Grandmother    Obesity Paternal Grandfather    Colon cancer Neg Hx    Rectal cancer Neg Hx    Past Surgical History:  Procedure Laterality Date   HERNIA REPAIR     double   TONSILLECTOMY AND ADENOIDECTOMY     Social History   Social History Narrative   Not on  file   Immunization History  Administered Date(s) Administered   Influenza, Seasonal, Injecte, Preservative Fre 08/25/2023   Influenza,inj,Quad PF,6+ Mos 10/16/2020, 08/11/2022   Influenza-Unspecified 08/30/2021, 08/26/2023   Moderna Sars-Covid-2 Vaccination 02/09/2020, 03/08/2020, 11/01/2020   Pfizer(Comirnaty)Fall Seasonal Vaccine 12 years and older 08/25/2023   Tdap 10/16/2020   Unspecified SARS-COV-2 Vaccination 08/26/2023     Objective: Vital Signs: BP 108/70 (BP Location: Left Arm, Patient Position: Sitting, Cuff Size: Normal)   Pulse 71   Resp 14   Ht 5' 5 (1.651 m)   Wt 188 lb 6.4 oz (85.5 kg)   LMP 07/11/2024 (Exact Date)   BMI 31.35 kg/m    Physical Exam Vitals and nursing note reviewed.  Constitutional:      Appearance: She is well-developed.  HENT:     Head: Normocephalic and atraumatic.  Eyes:     Conjunctiva/sclera: Conjunctivae normal.  Cardiovascular:     Rate and Rhythm: Normal rate and regular rhythm.     Heart sounds: Normal heart sounds.  Pulmonary:     Effort: Pulmonary effort is normal.     Breath sounds: Normal breath sounds.  Abdominal:     General: Bowel sounds are normal.     Palpations: Abdomen is soft.  Musculoskeletal:     Cervical back: Normal range of motion.  Lymphadenopathy:     Cervical: No cervical adenopathy.  Skin:    General: Skin is warm and dry.     Capillary Refill: Capillary refill takes less than 2 seconds.  Neurological:     Mental Status: She is alert and oriented to person, place, and time.  Psychiatric:        Behavior: Behavior normal.      Musculoskeletal Exam: C-spine, thoracic spine, lumbar spine good range of motion.  Shoulder joints, elbow joints, wrist joints and MCPs, PIPs and DIPs have good range of motion with no synovitis.  Complete fist formation bilaterally.  Hip joints have good range of motion with tenderness over the right trochanteric bursa.  Knee joints have good range of motion no warmth or  effusion.  Ankle joints have good range of motion with no tenderness or joint swelling.  CDAI Exam: CDAI Score: -- Patient Global: --; Provider Global: -- Swollen: --; Tender: -- Joint Exam 08/03/2024   No joint exam has been documented for this visit   There is currently no information documented on the homunculus. Go to the Rheumatology activity and complete the homunculus joint exam.  Investigation: No additional findings.  Imaging: No results found.  Recent Labs: Lab Results  Component Value Date   WBC 9.0 07/01/2024   HGB 14.2 07/01/2024   PLT 315 07/01/2024   NA 138 07/01/2024   K 4.0 07/01/2024   CL 105 07/01/2024   CO2 26 07/01/2024   GLUCOSE 100 (H) 07/01/2024   BUN 8 07/01/2024   CREATININE 0.64 07/01/2024   BILITOT 0.6 07/01/2024   ALKPHOS 45 01/18/2024   AST 17  07/01/2024   ALT 16 07/01/2024   PROT 7.1 07/01/2024   ALBUMIN 4.2 01/18/2024   CALCIUM 9.2 07/01/2024   GFRAA 144 02/06/2021    Speciality Comments: PLQ EYE EXAM: 03/02/24 WNL [facility] Follow up in 1 year.    Procedures:  No procedures performed Allergies: Latex    Assessment / Plan:     Visit Diagnoses: MCTD (mixed connective tissue disease) (HCC)+ANA,+RNP,+SSA -She has not had any signs or symptoms of a flare.  She has clinically been doing well taking Plaquenil  200 mg 1 tablet by mouth twice daily Monday through Friday.  She is tolerating Plaquenil  without any side effects and has not had any recent gaps in therapy.  Refill Plaquenil  sent to the pharmacy today. She continues to have chronic dry eyes and occasional oral ulcers.  No recent rashes or Raynaud's phenomenon. Lab work on 03/15/24 was reviewed today in the office: ANA 1:40NS, dsDNA 2, complements WNL, ESR 2, RNP 2.4, Ro 2.7, CRP<3, tTG antibody<1.  Plan to update the following lab work today for further evaluation.  She will notify us  if she develops any signs or symptoms of a flare.  She will follow-up in the office in 5 months  or sooner if needed.  Plan: Sedimentation rate, C3 and C4, Anti-DNA antibody, double-stranded, Protein / creatinine ratio, urine  High risk medication use - Plaquenil  200 mg 1 tablet by mouth twice daily Monday through Friday. PLQ EYE EXAM: 03/02/24 WNL CBC and CMP updated on 07/01/24.  PLQ EYE EXAM: 03/02/24 WNL [facility] Follow up in 1 year.  Patient is planning to proceed with hysterectomy.  Pain in both hands: She has no tenderness or synovitis on examination today.  Complete fist formation bilaterally.  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 S/P Cortisone injection Left Knee on 05/25/2024--improved.  No warmth or effusion noted.  Pain in both feet: She has good range of motion of both ankle joints with no tenderness or joint swelling.  She is wearing proper fitting shoes.  Pain in right hip - X-rays of the right hip were obtained on 03/15/2024 which were unremarkable. No groin pain currently.  S/P Cortisone Injection Right Greater Trochanteric bursa on 05/25/2024--- patient remains symptomatic.  She will be starting physical therapy as ordered by PCP.  Myofascial pain - She remains on Cymbalta  40 mg daily.  She has been taking methocarbamol  500 mg 1 tablet daily as needed for muscle spasms.  She will be starting physical therapy.  Discussed the importance of regular exercise and good sleep hygiene.  Other fatigue - Chronic, stable.  Discussed the importance of regular exercise and good sleep hygiene.  Neck pain: She has trapezius muscle tension and tenderness bilaterally.  She has been using a TENS unit which has been helpful.  She also has been taking methocarbamol  as needed for muscle spasms and muscle tension.  Other medical conditions are listed as follows:   Chronic diarrhea - Referral to GI placed 03/15/2024--She has been started on Bentyl .  Anxiety and depression - She is taking  cymbalta  40 mg daily.  History of IBS  Seasonal allergies  Family history of gout  Family history of rheumatoid arthritis  Orders: Orders Placed This Encounter  Procedures   Sedimentation rate   C3 and C4   Anti-DNA antibody, double-stranded   Protein / creatinine ratio, urine   Meds ordered this encounter  Medications  hydroxychloroquine  (PLAQUENIL ) 200 MG tablet    Sig: TAKE 1 TABLET BY MOUTH TWICE DAILY MONDAY THROUGH FRIDAY ONLY    Dispense:  120 tablet    Refill:  0     Follow-Up Instructions: Return in about 5 months (around 01/03/2025) for MCTD.   Waddell CHRISTELLA Craze, PA-C  Note - This record has been created using Dragon software.  Chart creation errors have been sought, but may not always  have been located. Such creation errors do not reflect on  the standard of medical care.

## 2024-08-02 ENCOUNTER — Ambulatory Visit: Admitting: Physician Assistant

## 2024-08-02 DIAGNOSIS — M351 Other overlap syndromes: Secondary | ICD-10-CM

## 2024-08-02 DIAGNOSIS — M7061 Trochanteric bursitis, right hip: Secondary | ICD-10-CM

## 2024-08-02 DIAGNOSIS — Z79899 Other long term (current) drug therapy: Secondary | ICD-10-CM

## 2024-08-02 DIAGNOSIS — M7918 Myalgia, other site: Secondary | ICD-10-CM

## 2024-08-02 DIAGNOSIS — J302 Other seasonal allergic rhinitis: Secondary | ICD-10-CM

## 2024-08-02 DIAGNOSIS — F419 Anxiety disorder, unspecified: Secondary | ICD-10-CM

## 2024-08-02 DIAGNOSIS — R5383 Other fatigue: Secondary | ICD-10-CM

## 2024-08-02 DIAGNOSIS — G8929 Other chronic pain: Secondary | ICD-10-CM

## 2024-08-02 DIAGNOSIS — Z8269 Family history of other diseases of the musculoskeletal system and connective tissue: Secondary | ICD-10-CM

## 2024-08-02 DIAGNOSIS — M79672 Pain in left foot: Secondary | ICD-10-CM

## 2024-08-02 DIAGNOSIS — M79642 Pain in left hand: Secondary | ICD-10-CM

## 2024-08-02 DIAGNOSIS — K529 Noninfective gastroenteritis and colitis, unspecified: Secondary | ICD-10-CM

## 2024-08-02 DIAGNOSIS — Z8719 Personal history of other diseases of the digestive system: Secondary | ICD-10-CM

## 2024-08-02 DIAGNOSIS — M542 Cervicalgia: Secondary | ICD-10-CM

## 2024-08-02 DIAGNOSIS — Z8261 Family history of arthritis: Secondary | ICD-10-CM

## 2024-08-03 ENCOUNTER — Ambulatory Visit: Payer: Self-pay | Attending: Physician Assistant | Admitting: Physician Assistant

## 2024-08-03 ENCOUNTER — Encounter: Payer: Self-pay | Admitting: Physician Assistant

## 2024-08-03 VITALS — BP 108/70 | HR 71 | Resp 14 | Ht 65.0 in | Wt 188.4 lb

## 2024-08-03 DIAGNOSIS — Z8719 Personal history of other diseases of the digestive system: Secondary | ICD-10-CM

## 2024-08-03 DIAGNOSIS — Z79899 Other long term (current) drug therapy: Secondary | ICD-10-CM | POA: Diagnosis not present

## 2024-08-03 DIAGNOSIS — M79641 Pain in right hand: Secondary | ICD-10-CM | POA: Diagnosis not present

## 2024-08-03 DIAGNOSIS — G8929 Other chronic pain: Secondary | ICD-10-CM

## 2024-08-03 DIAGNOSIS — M351 Other overlap syndromes: Secondary | ICD-10-CM | POA: Diagnosis not present

## 2024-08-03 DIAGNOSIS — F32A Depression, unspecified: Secondary | ICD-10-CM

## 2024-08-03 DIAGNOSIS — F419 Anxiety disorder, unspecified: Secondary | ICD-10-CM

## 2024-08-03 DIAGNOSIS — M25551 Pain in right hip: Secondary | ICD-10-CM

## 2024-08-03 DIAGNOSIS — Z8261 Family history of arthritis: Secondary | ICD-10-CM

## 2024-08-03 DIAGNOSIS — M25562 Pain in left knee: Secondary | ICD-10-CM | POA: Diagnosis not present

## 2024-08-03 DIAGNOSIS — M7918 Myalgia, other site: Secondary | ICD-10-CM

## 2024-08-03 DIAGNOSIS — K529 Noninfective gastroenteritis and colitis, unspecified: Secondary | ICD-10-CM

## 2024-08-03 DIAGNOSIS — M79642 Pain in left hand: Secondary | ICD-10-CM

## 2024-08-03 DIAGNOSIS — M79672 Pain in left foot: Secondary | ICD-10-CM

## 2024-08-03 DIAGNOSIS — M79671 Pain in right foot: Secondary | ICD-10-CM

## 2024-08-03 DIAGNOSIS — M542 Cervicalgia: Secondary | ICD-10-CM

## 2024-08-03 DIAGNOSIS — Z8269 Family history of other diseases of the musculoskeletal system and connective tissue: Secondary | ICD-10-CM

## 2024-08-03 DIAGNOSIS — J302 Other seasonal allergic rhinitis: Secondary | ICD-10-CM

## 2024-08-03 DIAGNOSIS — R5383 Other fatigue: Secondary | ICD-10-CM

## 2024-08-03 MED ORDER — HYDROXYCHLOROQUINE SULFATE 200 MG PO TABS: ORAL_TABLET | ORAL | 0 refills | Status: AC

## 2024-08-04 ENCOUNTER — Ambulatory Visit: Payer: Self-pay | Admitting: Physician Assistant

## 2024-08-04 LAB — ANTI-DNA ANTIBODY, DOUBLE-STRANDED: ds DNA Ab: 2 [IU]/mL

## 2024-08-04 LAB — C3 AND C4
C3 Complement: 139 mg/dL (ref 83–193)
C4 Complement: 19 mg/dL (ref 15–57)

## 2024-08-04 LAB — SEDIMENTATION RATE: Sed Rate: 6 mm/h (ref 0–20)

## 2024-08-04 LAB — PROTEIN / CREATININE RATIO, URINE
Creatinine, Urine: 180 mg/dL (ref 20–275)
Protein/Creat Ratio: 94 mg/g{creat} (ref 24–184)
Protein/Creatinine Ratio: 0.094 mg/mg{creat} (ref 0.024–0.184)
Total Protein, Urine: 17 mg/dL (ref 5–24)

## 2024-08-04 NOTE — Progress Notes (Signed)
 ESR WNL  Urine protein creatinine ratio WNL

## 2024-08-04 NOTE — Progress Notes (Signed)
Complements WNL

## 2024-08-05 NOTE — Progress Notes (Signed)
dsDNA negative.  Labs are not consistent with a flare.

## 2024-08-09 ENCOUNTER — Telehealth: Payer: Self-pay | Admitting: *Deleted

## 2024-08-09 NOTE — Telephone Encounter (Signed)
 Recommend picking up prescription as soon as possible and taking as prescribed.  Symptoms should subside as Plaquenil  gets back in her system. She is unable to take steroids due to recent reaction.

## 2024-08-09 NOTE — Telephone Encounter (Signed)
 Patient advised recommend picking up prescription as soon as possible and taking as prescribed.  Symptoms should subside as Plaquenil  gets back in her system. She is unable to take steroids due to recent reaction. Patient expressed understanding.

## 2024-08-09 NOTE — Telephone Encounter (Signed)
 Patient left message requesting a call back.   Returned call to patient. She states she has been off the PLQ for several days due to the pharmacy not letting her know her prescription was sent in and ready. Patient states she is now having swelling and pain in her joints. Patient states yesterday she was unable to squat. Patient states is her hands and feet are swollen. Please advise.

## 2024-08-13 ENCOUNTER — Other Ambulatory Visit: Payer: Self-pay | Admitting: Physician Assistant

## 2024-08-15 NOTE — Telephone Encounter (Signed)
 Please call patient to schedule appt. Thank you.  Return in about 5 months (around 01/03/2025) for MCTD.

## 2024-08-15 NOTE — Telephone Encounter (Signed)
 Last Fill: 06/27/2024  Next Visit:  Message sent to front desk to schedule appt., Return in about 5 months (around 01/03/2025) for MCTD.   Last Visit: 08/03/2024  Dx: Myofascial pain   Current Dose per office note on 08/03/2024:  methocarbamol  500 mg 1 tablet daily as needed for muscle spasms   Okay to refill Methocarbamol ?

## 2024-08-18 ENCOUNTER — Other Ambulatory Visit: Payer: Self-pay | Admitting: Obstetrics and Gynecology

## 2024-08-18 ENCOUNTER — Other Ambulatory Visit: Payer: Self-pay | Admitting: Radiology

## 2024-08-18 ENCOUNTER — Other Ambulatory Visit: Admitting: Radiology

## 2024-08-18 ENCOUNTER — Other Ambulatory Visit

## 2024-08-18 DIAGNOSIS — N921 Excessive and frequent menstruation with irregular cycle: Secondary | ICD-10-CM

## 2024-08-18 DIAGNOSIS — R1032 Left lower quadrant pain: Secondary | ICD-10-CM

## 2024-08-18 DIAGNOSIS — N946 Dysmenorrhea, unspecified: Secondary | ICD-10-CM | POA: Diagnosis not present

## 2024-08-18 DIAGNOSIS — N938 Other specified abnormal uterine and vaginal bleeding: Secondary | ICD-10-CM

## 2024-08-18 DIAGNOSIS — N920 Excessive and frequent menstruation with regular cycle: Secondary | ICD-10-CM

## 2024-08-18 NOTE — Therapy (Unsigned)
 OUTPATIENT PHYSICAL THERAPY THORACOLUMBAR EVALUATION   Patient Name: Joanne Wallace MRN: [id number] DOB:06-07-1996, 28 y.o., female Today's Date: 08/19/2024  END OF SESSION:  PT End of Session - 08/19/24 1152     Visit Number 1    Number of Visits 8    Date for Recertification  10/19/24    Authorization Type [place]    PT Start Time 1045    PT Stop Time 1130    PT Time Calculation (min) 45 min    Activity Tolerance Patient tolerated treatment well    Behavior During Therapy [facility] for tasks assessed/performed;Anxious          Past Medical History:  Diagnosis Date   Anxiety 2014   Depression 2014   IBS (irritable bowel syndrome)    Mixed connective tissue disease (HCC)    Neuromuscular disorder (HCC) 2021   mixed connective tissue disease   Past Surgical History:  Procedure Laterality Date   HERNIA REPAIR     double   TONSILLECTOMY AND ADENOIDECTOMY     Patient Active Problem List   Diagnosis Date Noted   Restless leg 01/18/2024   Routine general medical examination at a health care facility 08/27/2023   Concentration deficit 03/17/2023   Bilateral knee pain 03/17/2023   Autoimmune disease (HCC) 09/26/2020   Menstrual changes 05/07/2019   Fatigue 05/07/2019    PCP: Rollene Almarie LABOR, MD  REFERRING PROVIDER: Rollene Almarie LABOR, MD  REFERRING DIAG: M35.9 (ICD-10-CM) - Autoimmune disease [facility])  Rationale for Evaluation and Treatment: Rehabilitation  THERAPY DIAG:  Other low back pain  Muscle pain, myofascial  Muscle weakness (generalized)  ONSET DATE: chronic  SUBJECTIVE:                                                                                                                                                                                           SUBJECTIVE STATEMENT: Patient arrives to PT for issues related to pain globally due to underlying soft tissue diease.  She has TENS unit for symptoms relief but most beneficial    PERTINENT  HISTORY:  Chronic hip, neck, and shoulder pain   She experiences chronic severe pain in her hip, neck, and shoulder, with previous treatments proving ineffective. Methocarbamol  provides relief but is not suitable for long-term use. Physical therapy has been beneficial but is costly, and dry needling is recommended but cost-prohibitive. A TENS unit is suggested as a non-invasive option. Refer her to physical therapy for an integrative treatment approach. Recommend purchasing a TENS unit for home use to manage pain. Encourage continuation of bursitis stretches and exercises.  PAIN:  Are you having pain? Yes:  NPRS scale: 10/10 Pain location: global Pain description: ache Aggravating factors: strenuous activity,  Relieving factors: TENS  PRECAUTIONS: None  RED FLAGS: None   WEIGHT BEARING RESTRICTIONS: No  FALLS:  Has patient fallen in last 6 months? No  OCCUPATION: hospitality  PLOF: Independent  PATIENT GOALS: To manage my pain symptoms  NEXT MD VISIT: TBD  OBJECTIVE:  Note: Objective measures were completed at Evaluation unless otherwise noted.  DIAGNOSTIC FINDINGS:  none  PATIENT SURVEYS:  Patient-specific activity scoring scheme (Point to one number):  0 represents "unable to perform." 10 represents "able to perform at prior level. 0 1 2 3 4 5 6 7 8 9  10 (Date and Score) Activity Initial  Activity Eval     Cooking/cleaning  5    Hiking/walking  3    travel 4     Total score = sum of the activity scores/number of activities Minimum detectable change (90%CI) for average score = 2 points Minimum detectable change (90%CI) for single activity score = 3 points PSFS developed by: Rosalee MYRTIS Marvis KYM Charlet CHRISTELLA., & Binkley, J. (1995). Assessing disability and change on individual  patients: a report of a patient specific measure. Physiotherapy [place], 47, [phone]. Reproduced with the permission of the authors  Score: 12/30  MUSCLE LENGTH: Hamstrings: Right  90 deg; Left 90 deg Thomas test: PKB unremarkable B  POSTURE: rounded shoulders and elevated R shoulder, scapular winging B  PALPATION: TTP B UT and levators  LUMBAR ROM:   AROM eval  Flexion 100%  Extension 100%  Right lateral flexion 100%  Left lateral flexion 100%  Right rotation 100%  Left rotation 100%   (Blank rows = not tested) CERVICAL ROM:   Active ROM A/PROM (deg) eval  Flexion 90%  Extension 90%  Right lateral flexion 75%  Left lateral flexion 75%  Right rotation 90%  Left rotation 90%   (Blank rows = not tested)   LOWER EXTREMITY ROM:   WNL  Active  Right eval Left eval  Hip flexion    Hip extension    Hip abduction    Hip adduction    Hip internal rotation    Hip external rotation    Knee flexion    Knee extension    Ankle dorsiflexion    Ankle plantarflexion    Ankle inversion    Ankle eversion     (Blank rows = not tested)  LOWER EXTREMITY MMT:  see 30s chair stand test  MMT Right eval Left eval  Hip flexion    Hip extension    Hip abduction    Hip adduction    Hip internal rotation    Hip external rotation    Knee flexion    Knee extension    Ankle dorsiflexion    Ankle plantarflexion    Ankle inversion    Ankle eversion     (Blank rows = not tested)  LUMBAR SPECIAL TESTS:  Straight leg raise test: Negative and Slump test: Negative  FUNCTIONAL TESTS:  30 seconds chair stand test 7  GAIT: Distance walked: 42ft x2 Assistive device utilized: None Level of assistance: Complete Independence Comments: unremarkable  TREATMENT:  [facility] Adult PT Treatment:                                                DATE: 08/18/24  Self Care: Additional minutes spent for educating on updated Therapeutic Home Exercise Program as well as comparing current status to condition at start of symptoms. This included exercises  focusing on stretching, strengthening, with focus on eccentric aspects. Long term goals include an improvement in range of motion, strength, endurance as well as avoiding reinjury. Patient's frequency would include in 1-2 times a day, 3-5 times a week for a duration of 6-12 weeks. Proper technique shown and discussed handout in great detail. All questions were discussed and addressed.      PATIENT EDUCATION:  Education details: Discussed eval findings, rehab rationale and POC and patient is in agreement  Person educated: Patient Education method: Explanation and Handouts Education comprehension: verbalized understanding and needs further education  HOME EXERCISE PROGRAM: Access Code: [facility] URL: [email or web address] Date: 08/19/2024 Prepared by: Danali Marinos  Exercises - Seated Upper Trapezius Stretch  - 1-2 x daily - 5 x weekly - 1 sets - 30s hold - Seated Shoulder Horizontal Abduction with Resistance  - 1-2 x daily - 5 x weekly - 2 sets - 10 reps - Clamshell  - 1-2 x daily - 5 x weekly - 2 sets - 10 reps - Sit to Stand with Arms Crossed  - 1-2 x daily - 5 x weekly - 2 sets - 5 reps  ASSESSMENT:  CLINICAL IMPRESSION: Patient is a 28 y.o. female who was seen today for physical therapy evaluation and treatment for chronic upper back and neck pain as well as low back and hip pain.  Symptoms attributed to chronic autoimmune disorder resulting in soft tissue irritation and inflammation.  Patient presents with full lumbar range of motion however cervical range of motion restricted in sidebending.  Range of motion and flexibility of lower extremities is within normal limits with mild discomfort noted with IT band stretching.  32nd chair stand test finds lower extremity weakness with fair body mechanics observed.  Palpation of upper back findings soft tissue irritation and tightness to bilateral scalenes upper traps and levator scapular regions right greater than left.  Patient is  a good candidate for outpatient physical therapy with the goal of developing a home exercise program for self-management.  Recommend a hybrid approach of land-based therapy for stretching and strengthening follow-up which would include aquatic therapy for pain management and functional activities.   OBJECTIVE IMPAIRMENTS: decreased activity tolerance, decreased endurance, decreased knowledge of condition, decreased mobility, decreased ROM, decreased strength, increased muscle spasms, improper body mechanics, postural dysfunction, and pain.   ACTIVITY LIMITATIONS: carrying, lifting, bending, sitting, standing, squatting, dressing, and locomotion level  PARTICIPATION LIMITATIONS: meal prep, cleaning, laundry, and community activity  PERSONAL FACTORS: Age, Fitness, Past/current experiences, Time since onset of injury/illness/exacerbation, and 1 comorbidity: auto immune disease are also affecting patient's functional outcome.   REHAB POTENTIAL: Fair based on chronicity and nature of symptoms  CLINICAL DECISION MAKING: Evolving/moderate complexity  EVALUATION COMPLEXITY: Moderate   GOALS: Goals reviewed with patient? No  SHORT TERM GOALS: Target date: 09/16/2024    Patient to demonstrate independence in HEP  Baseline: [facility] Goal status: INITIAL  2.  Initiate aquatic program Baseline: TBD Goal status: INITIAL   LONG TERM GOALS: Target date: 10/14/2024  Patient will acknowledge 6/10 pain at least once during episode of care   Baseline: 10/10 worst pain Goal status: INITIAL  2.  Patient will increase 30s chair stand reps from 7 to 10 with/without arms to demonstrate and improved functional ability with less pain/difficulty as well as reduce fall risk.  Baseline: 7 Goal status: INITIAL  3.  Patient will score at least 20/30 on PSFS to signify clinically meaningful improvement in functional abilities.   Baseline: 12/30 Goal status: INITIAL  4.  Increase cervical B SB to  90% Baseline: 75% B Goal status: INITIAL    PLAN:  PT FREQUENCY: 1-2x/week  PT DURATION: 8 weeks  PLANNED INTERVENTIONS: 97110-Therapeutic exercises, 97530- Therapeutic activity, [id number]- Neuromuscular re-education, 97535- Self Care, [id number]- Manual therapy, 20560 (1-2 muscles), 20561 (3+ muscles)- Dry Needling, and Patient/Family education.  PLAN FOR NEXT SESSION: HEP review and update, manual techniques as appropriate, aerobic tasks, ROM and flexibility activities, strengthening and PREs, TPDN, gait and balance training as needed    For all possible CPT codes, reference the Planned Interventions line above.     Check all conditions that are expected to impact treatment: {Conditions expected to impact treatment:Musculoskeletal disorders and Associated genetic disorder   If treatment provided at initial evaluation, no treatment charged due to lack of authorization.       Josephus Harriger M Taro Hidrogo, PT 08/19/2024, 12:02 PM

## 2024-08-19 ENCOUNTER — Ambulatory Visit: Attending: Internal Medicine

## 2024-08-19 DIAGNOSIS — M5459 Other low back pain: Secondary | ICD-10-CM | POA: Diagnosis present

## 2024-08-19 DIAGNOSIS — M7918 Myalgia, other site: Secondary | ICD-10-CM | POA: Diagnosis present

## 2024-08-19 DIAGNOSIS — M6281 Muscle weakness (generalized): Secondary | ICD-10-CM | POA: Insufficient documentation

## 2024-08-19 DIAGNOSIS — M359 Systemic involvement of connective tissue, unspecified: Secondary | ICD-10-CM | POA: Insufficient documentation

## 2024-08-19 NOTE — Patient Instructions (Signed)
   Aquatic Therapy: What to Expect!  Where:  [facility] at [facility] [address] [place], [place]  [address] [phone]  NOTE:  You will receive an automated phone message reminding you of your appointment and it will say the appointment is at the [facility] on [address]  We are working to fix this- just know that you will meet [place] at the pool!  How to Prepare: Please make sure you drink 8 ounces of water about one hour prior to your pool session A caregiver MUST attend the entire session with the patient.  The caregiver will be responsible for assisting with dressing as well as any toileting needs.  If the patient will be doing a home program this should likely be the person who will assist as well.  Patients must wear either their street shoes or pool shoes until they are ready to enter the pool with the therapist.  Patients must also wear either street shoes or pool shoes once exiting the pool to walk to the locker room.  This will helps [place] prevent slips and falls.  Please arrive 15 minutes early to prepare for your pool therapy session Sign in at the front desk on the clipboard marked for [place] You may use the locker rooms on your right and then enter directly into the recreation pool (NOT the competition pool) Please make sure to attend to any toileting needs prior to entering the pool Please be dressed in your swim suit and on the pool deck at least 5 minutes before your appointment Once on the pool deck your therapist will ask you to sign the Patient  Consent and Assignment of Benefits form Your therapist may take your blood pressure prior to, during and after your session if indicated  About the pool  and parking: Entering the pool Your therapist will assist you; there are 2 ways to enter:  stairs with railings or with a chair lift.   Your therapist will determine the most appropriate way for you. Water temperature is usually between 86-87 degrees There  may be other swimmers in the pool at the same time Parking is free.   Contact Info:     Appointments: [facility]  All sessions are 45 minutes   [address]  [address]     Please call the [facility] if   [place], [place]   [address]    you need to cancel or reschedule an appointment.  [phone]

## 2024-08-22 ENCOUNTER — Encounter

## 2024-08-29 ENCOUNTER — Encounter: Payer: 59 | Admitting: Internal Medicine

## 2024-08-30 ENCOUNTER — Ambulatory Visit: Payer: Self-pay | Admitting: Obstetrics and Gynecology

## 2024-08-30 NOTE — Therapy (Signed)
 OUTPATIENT PHYSICAL THERAPY THORACOLUMBAR TREATMENT   Patient Name: Joanne Wallace MRN: [id number] DOB:1996/10/29, 28 y.o., female Today's Date: 08/30/2024  END OF SESSION:    Past Medical History:  Diagnosis Date   Anxiety 2014   Depression 2014   IBS (irritable bowel syndrome)    Mixed connective tissue disease    Neuromuscular disorder (HCC) 2021   mixed connective tissue disease   Past Surgical History:  Procedure Laterality Date   HERNIA REPAIR     double   TONSILLECTOMY AND ADENOIDECTOMY     Patient Active Problem List   Diagnosis Date Noted   Restless leg 01/18/2024   Routine general medical examination at a health care facility 08/27/2023   Concentration deficit 03/17/2023   Bilateral knee pain 03/17/2023   Autoimmune disease 09/26/2020   Menstrual changes 05/07/2019   Fatigue 05/07/2019    PCP: Rollene Almarie LABOR, MD  REFERRING PROVIDER: Rollene Almarie LABOR, MD  REFERRING DIAG: M35.9 (ICD-10-CM) - Autoimmune disease [facility])  Rationale for Evaluation and Treatment: Rehabilitation  THERAPY DIAG:  No diagnosis found.  ONSET DATE: chronic  SUBJECTIVE:                                                                                                                                                                                           SUBJECTIVE STATEMENT: Patient arrives to PT for issues related to pain globally due to underlying soft tissue diease.  She has TENS unit for symptoms relief but most beneficial    PERTINENT HISTORY:  Chronic hip, neck, and shoulder pain   She experiences chronic severe pain in her hip, neck, and shoulder, with previous treatments proving ineffective. Methocarbamol  provides relief but is not suitable for long-term use. Physical therapy has been beneficial but is costly, and dry needling is recommended but cost-prohibitive. A TENS unit is suggested as a non-invasive option. Refer her to physical therapy for an integrative  treatment approach. Recommend purchasing a TENS unit for home use to manage pain. Encourage continuation of bursitis stretches and exercises.  PAIN:  Are you having pain? Yes: NPRS scale: 10/10 Pain location: global Pain description: ache Aggravating factors: strenuous activity,  Relieving factors: TENS  PRECAUTIONS: None  RED FLAGS: None   WEIGHT BEARING RESTRICTIONS: No  FALLS:  Has patient fallen in last 6 months? No  OCCUPATION: hospitality  PLOF: Independent  PATIENT GOALS: To manage my pain symptoms  NEXT MD VISIT: TBD  OBJECTIVE:  Note: Objective measures were completed at Evaluation unless otherwise noted.  DIAGNOSTIC FINDINGS:  none  PATIENT SURVEYS:  Patient-specific activity scoring scheme (Point to one number):  0 represents "unable to  perform." 10 represents "able to perform at prior level. 0 1 2 3 4 5 6 7 8 9  10 (Date and Score) Activity Initial  Activity Eval     Cooking/cleaning  5    Hiking/walking  3    travel 4     Total score = sum of the activity scores/number of activities Minimum detectable change (90%CI) for average score = 2 points Minimum detectable change (90%CI) for single activity score = 3 points PSFS developed by: Rosalee MYRTIS Marvis KYM Charlet CHRISTELLA., & Binkley, J. (1995). Assessing disability and change on individual  patients: a report of a patient specific measure. Physiotherapy [place], 47, [phone]. Reproduced with the permission of the authors  Score: 12/30  MUSCLE LENGTH: Hamstrings: Right 90 deg; Left 90 deg Thomas test: PKB unremarkable B  POSTURE: rounded shoulders and elevated R shoulder, scapular winging B  PALPATION: TTP B UT and levators  LUMBAR ROM:   AROM eval  Flexion 100%  Extension 100%  Right lateral flexion 100%  Left lateral flexion 100%  Right rotation 100%  Left rotation 100%   (Blank rows = not tested) CERVICAL ROM:   Active ROM A/PROM (deg) eval  Flexion 90%  Extension 90%   Right lateral flexion 75%  Left lateral flexion 75%  Right rotation 90%  Left rotation 90%   (Blank rows = not tested)   LOWER EXTREMITY ROM:   WNL  Active  Right eval Left eval  Hip flexion    Hip extension    Hip abduction    Hip adduction    Hip internal rotation    Hip external rotation    Knee flexion    Knee extension    Ankle dorsiflexion    Ankle plantarflexion    Ankle inversion    Ankle eversion     (Blank rows = not tested)  LOWER EXTREMITY MMT:  see 30s chair stand test  MMT Right eval Left eval  Hip flexion    Hip extension    Hip abduction    Hip adduction    Hip internal rotation    Hip external rotation    Knee flexion    Knee extension    Ankle dorsiflexion    Ankle plantarflexion    Ankle inversion    Ankle eversion     (Blank rows = not tested)  LUMBAR SPECIAL TESTS:  Straight leg raise test: Negative and Slump test: Negative  FUNCTIONAL TESTS:  30 seconds chair stand test 7  GAIT: Distance walked: 39ft x2 Assistive device utilized: None Level of assistance: Complete Independence Comments: unremarkable  TREATMENT: [facility] Adult PT Treatment:                                                DATE: 08/31/24 - Seated Upper Trapezius Stretch  - 1-2 x daily - 5 x weekly - 1 sets - 30s hold - Seated Shoulder Horizontal Abduction with Resistance  - 1-2 x daily - 5 x weekly - 2 sets - 10 reps - Clamshell  - 1-2 x daily - 5 x weekly - 2 sets - 10 reps - Sit to Stand with Arms Crossed  - 1-2 x daily - 5 x weekly - 2 sets - 5 reps Therapeutic Exercise: *** Manual Therapy: *** Neuromuscular re-ed: *** Therapeutic Activity: *** Modalities: *** Self Care: ***  [facility] Adult PT Treatment:                                                DATE: 08/18/24  Self Care: Additional minutes spent for educating on updated Therapeutic  Home Exercise Program as well as comparing current status to condition at start of symptoms. This included exercises focusing on stretching, strengthening, with focus on eccentric aspects. Long term goals include an improvement in range of motion, strength, endurance as well as avoiding reinjury. Patient's frequency would include in 1-2 times a day, 3-5 times a week for a duration of 6-12 weeks. Proper technique shown and discussed handout in great detail. All questions were discussed and addressed.      PATIENT EDUCATION:  Education details: Discussed eval findings, rehab rationale and POC and patient is in agreement  Person educated: Patient Education method: Explanation and Handouts Education comprehension: verbalized understanding and needs further education  HOME EXERCISE PROGRAM: Access Code: [facility] URL: [email or web address] Date: 08/19/2024 Prepared by: Jeffrey Ziemba  Exercises - Seated Upper Trapezius Stretch  - 1-2 x daily - 5 x weekly - 1 sets - 30s hold - Seated Shoulder Horizontal Abduction with Resistance  - 1-2 x daily - 5 x weekly - 2 sets - 10 reps - Clamshell  - 1-2 x daily - 5 x weekly - 2 sets - 10 reps - Sit to Stand with Arms Crossed  - 1-2 x daily - 5 x weekly - 2 sets - 5 reps  ASSESSMENT:  CLINICAL IMPRESSION: Patient is a 28 y.o. female who was seen today for physical therapy evaluation and treatment for chronic upper back and neck pain as well as low back and hip pain.  Symptoms attributed to chronic autoimmune disorder resulting in soft tissue irritation and inflammation.  Patient presents with full lumbar range of motion however cervical range of motion restricted in sidebending.  Range of motion and flexibility of lower extremities is within normal limits with mild discomfort noted with IT band stretching.  32nd chair stand test finds lower extremity weakness with fair body mechanics observed.  Palpation of upper back findings soft tissue  irritation and tightness to bilateral scalenes upper traps and levator scapular regions right greater than left.  Patient is a good candidate for outpatient physical therapy with the goal of developing a home exercise program for self-management.  Recommend a hybrid approach of land-based therapy for stretching and strengthening follow-up which would include aquatic therapy for pain management and functional activities.   OBJECTIVE IMPAIRMENTS: decreased activity tolerance, decreased endurance, decreased knowledge of condition, decreased mobility, decreased ROM, decreased strength, increased muscle spasms, improper body mechanics, postural dysfunction, and pain.   ACTIVITY LIMITATIONS: carrying, lifting, bending, sitting, standing, squatting, dressing, and locomotion level  PARTICIPATION LIMITATIONS: meal prep, cleaning, laundry, and community activity  PERSONAL FACTORS: Age, Fitness, Past/current experiences, Time since onset of injury/illness/exacerbation, and 1 comorbidity: auto immune disease are also affecting patient's functional outcome.   REHAB POTENTIAL: Fair based on chronicity and nature of symptoms  CLINICAL DECISION MAKING: Evolving/moderate complexity  EVALUATION COMPLEXITY: Moderate   GOALS: Goals reviewed with patient? No  SHORT TERM GOALS: Target date: 09/16/2024    Patient to demonstrate independence in HEP  Baseline: [facility] Goal status: INITIAL  2.  Initiate aquatic program Baseline: TBD Goal status: INITIAL   LONG TERM GOALS: Target date: 10/14/2024  Patient will acknowledge 6/10 pain at least once during episode of care   Baseline: 10/10 worst pain Goal status: INITIAL  2.  Patient will increase 30s chair stand reps from 7 to 10 with/without arms to demonstrate and improved functional ability with less pain/difficulty as well as reduce fall risk.  Baseline: 7 Goal status: INITIAL  3.  Patient will score at least 20/30 on PSFS to signify clinically  meaningful improvement in functional abilities.   Baseline: 12/30 Goal status: INITIAL  4.  Increase cervical B SB to 90% Baseline: 75% B Goal status: INITIAL    PLAN:  PT FREQUENCY: 1-2x/week  PT DURATION: 8 weeks  PLANNED INTERVENTIONS: 97110-Therapeutic exercises, 97530- Therapeutic activity, [id number]- Neuromuscular re-education, 97535- Self Care, 02859- Manual therapy, 20560 (1-2 muscles), 20561 (3+ muscles)- Dry Needling, and Patient/Family education.  PLAN FOR NEXT SESSION: HEP review and update, manual techniques as appropriate, aerobic tasks, ROM and flexibility activities, strengthening and PREs, TPDN, gait and balance training as needed    For all possible CPT codes, reference the Planned Interventions line above.     Check all conditions that are expected to impact treatment: {Conditions expected to impact treatment:Musculoskeletal disorders and Associated genetic disorder   If treatment provided at initial evaluation, no treatment charged due to lack of authorization.       Dasie Daft, PT 08/30/2024, 9:39 PM

## 2024-08-31 ENCOUNTER — Ambulatory Visit: Attending: Internal Medicine

## 2024-08-31 DIAGNOSIS — M6281 Muscle weakness (generalized): Secondary | ICD-10-CM | POA: Diagnosis present

## 2024-08-31 DIAGNOSIS — M5459 Other low back pain: Secondary | ICD-10-CM | POA: Diagnosis present

## 2024-08-31 DIAGNOSIS — M7918 Myalgia, other site: Secondary | ICD-10-CM | POA: Diagnosis present

## 2024-09-01 NOTE — Therapy (Signed)
 OUTPATIENT PHYSICAL THERAPY THORACOLUMBAR TREATMENT   Patient Name: Joanne Wallace MRN: [id number] DOB:July 14, 1996, 28 y.o., female Today's Date: 09/02/2024  END OF SESSION:  PT End of Session - 09/02/24 0930     Visit Number 3    Number of Visits 8    Date for Recertification  10/19/24    Authorization Type [place]    PT Start Time 0930    PT Stop Time 1010    PT Time Calculation (min) 40 min    Activity Tolerance Patient tolerated treatment well    Behavior During Therapy [facility] for tasks assessed/performed            Past Medical History:  Diagnosis Date   Anxiety 2014   Depression 2014   IBS (irritable bowel syndrome)    Mixed connective tissue disease    Neuromuscular disorder (HCC) 2021   mixed connective tissue disease   Past Surgical History:  Procedure Laterality Date   HERNIA REPAIR     double   TONSILLECTOMY AND ADENOIDECTOMY     Patient Active Problem List   Diagnosis Date Noted   Restless leg 01/18/2024   Routine general medical examination at a health care facility 08/27/2023   Concentration deficit 03/17/2023   Bilateral knee pain 03/17/2023   Autoimmune disease 09/26/2020   Menstrual changes 05/07/2019   Fatigue 05/07/2019    PCP: Rollene Almarie LABOR, MD  REFERRING PROVIDER: Rollene Almarie LABOR, MD  REFERRING DIAG: M35.9 (ICD-10-CM) - Autoimmune disease [facility])  Rationale for Evaluation and Treatment: Rehabilitation  THERAPY DIAG:  Other low back pain  Muscle pain, myofascial  Muscle weakness (generalized)  ONSET DATE: chronic  SUBJECTIVE:                                                                                                                                                                                           SUBJECTIVE STATEMENT:  Pt reports today is a good day. Pain is low. She has not had a chance to complete her HEP since the last Appt.  PERTINENT HISTORY:  Chronic hip, neck, and shoulder pain   She experiences  chronic severe pain in her hip, neck, and shoulder, with previous treatments proving ineffective. Methocarbamol  provides relief but is not suitable for long-term use. Physical therapy has been beneficial but is costly, and dry needling is recommended but cost-prohibitive. A TENS unit is suggested as a non-invasive option. Refer her to physical therapy for an integrative treatment approach. Recommend purchasing a TENS unit for home use to manage pain. Encourage continuation of bursitis stretches and exercises.  PAIN:  Are you having pain? Yes: NPRS scale: 10/10. Currently:  4/10 Pain location: global Pain description: ache Aggravating factors: strenuous activity,  Relieving factors: TENS  PRECAUTIONS: None  RED FLAGS: None   WEIGHT BEARING RESTRICTIONS: No  FALLS:  Has patient fallen in last 6 months? No  OCCUPATION: hospitality  PLOF: Independent  PATIENT GOALS: To manage my pain symptoms  NEXT MD VISIT: TBD  OBJECTIVE:  Note: Objective measures were completed at Evaluation unless otherwise noted.  DIAGNOSTIC FINDINGS:  none  PATIENT SURVEYS:  Patient-specific activity scoring scheme (Point to one number):  0 represents "unable to perform." 10 represents "able to perform at prior level. 0 1 2 3 4 5 6 7 8 9  10 (Date and Score) Activity Initial  Activity Eval     Cooking/cleaning  5    Hiking/walking  3    travel 4     Total score = sum of the activity scores/number of activities Minimum detectable change (90%CI) for average score = 2 points Minimum detectable change (90%CI) for single activity score = 3 points PSFS developed by: Rosalee MYRTIS Marvis KYM Charlet CHRISTELLA., & Binkley, J. (1995). Assessing disability and change on individual  patients: a report of a patient specific measure. Physiotherapy [place], 47, [phone]. Reproduced with the permission of the authors  Score: 12/30  MUSCLE LENGTH: Hamstrings: Right 90 deg; Left 90 deg Thomas test: PKB  unremarkable B  POSTURE: rounded shoulders and elevated R shoulder, scapular winging B  PALPATION: TTP B UT and levators  LUMBAR ROM:   AROM eval  Flexion 100%  Extension 100%  Right lateral flexion 100%  Left lateral flexion 100%  Right rotation 100%  Left rotation 100%   (Blank rows = not tested) CERVICAL ROM:   Active ROM A/PROM (deg) eval  Flexion 90%  Extension 90%  Right lateral flexion 75%  Left lateral flexion 75%  Right rotation 90%  Left rotation 90%   (Blank rows = not tested)   LOWER EXTREMITY ROM:   WNL  Active  Right eval Left eval  Hip flexion    Hip extension    Hip abduction    Hip adduction    Hip internal rotation    Hip external rotation    Knee flexion    Knee extension    Ankle dorsiflexion    Ankle plantarflexion    Ankle inversion    Ankle eversion     (Blank rows = not tested)  LOWER EXTREMITY MMT:  see 30s chair stand test  MMT Right eval Left eval  Hip flexion    Hip extension    Hip abduction    Hip adduction    Hip internal rotation    Hip external rotation    Knee flexion    Knee extension    Ankle dorsiflexion    Ankle plantarflexion    Ankle inversion    Ankle eversion     (Blank rows = not tested)  LUMBAR SPECIAL TESTS:  Straight leg raise test: Negative and Slump test: Negative  FUNCTIONAL TESTS:  30 seconds chair stand test 7  GAIT: Distance walked: 38ft x2 Assistive device utilized: None Level of assistance: Complete Independence Comments: unremarkable  TREATMENT: [facility] Adult PT Treatment:                                                DATE: 09/02/24 Therapeutic Exercise: Rec bike x5 mins L3  Supine Lower Trunk Rotation  x10 5 Supine Piriformis Stretch x2 30 each Supine Figure 4 Piriformis Stretch x2 30 each Supine Bridge with Resistance Band x10 3 each Clamshell  x2 10 RTB PPT x10 3 Supine Pelvic Tilt with Straight Leg Raise and ball press x10 3 Hip ext at Counter x10 RTB Updated  HEP  [facility] Adult PT Treatment:                                                DATE: 08/31/24 Therapeutic Exercise: Supine chin tucks x5 3' Seated scapular retractions x5 5 Pectoral stretch 90d x3 30 Seated Upper Trapezius Stretch  x2 15 Seated Shoulder Horizontal Abduction star pattern x2 5 patterns RTB Clamshell  x2 10 RTB Sit to Stand with Arms Crossed x2 5 Self Care: Instructed in use of tennis ball on wall for massage and theracane for massage with pt returning demonstration Instructed proper sitting posture with and without lumbar support                                                                                                                           [facility] Adult PT Treatment:                                                DATE: 08/18/24  Self Care: Additional minutes spent for educating on updated Therapeutic Home Exercise Program as well as comparing current status to condition at start of symptoms. This included exercises focusing on stretching, strengthening, with focus on eccentric aspects. Long term goals include an improvement in range of motion, strength, endurance as well as avoiding reinjury. Patient's frequency would include in 1-2 times a day, 3-5 times a week for a duration of 6-12 weeks. Proper technique shown and discussed handout in great detail. All questions were discussed and addressed.      PATIENT EDUCATION:  Education details: Discussed eval findings, rehab rationale and POC and patient is in agreement  Person educated: Patient Education method: Explanation and Handouts Education comprehension: verbalized understanding and needs further education  HOME EXERCISE PROGRAM: Access Code: [facility] URL: [email or web address] Date: 09/02/2024 Prepared by: Dasie Daft  Exercises - Seated Upper Trapezius Stretch  - 1-2 x daily - 5 x weekly - 1 sets - 30s hold - Seated Shoulder Horizontal Abduction with Resistance  - 1-2 x daily - 5 x weekly - 2  sets - 10 reps - Clamshell  - 1-2 x daily - 5 x weekly - 2 sets - 10 reps - Sit to Stand with Arms Crossed  - 1-2 x daily - 5 x weekly - 2 sets - 5 reps - Supine Cervical Retraction with Towel  - 1 x  daily - 7 x weekly - 1 sets - 10 reps - 3 hold - Seated Cervical Retraction  - 6 x daily - 7 x weekly - 1 sets - 3 reps - 3 hold - Doorway Pec Stretch at 90 Degrees Abduction  - 1 x daily - 7 x weekly - 1 sets - 3 reps - 30 hold - Seated Scapular Retraction  - 6 x daily - 7 x weekly - 3 sets - 3-5 reps - 3 hold - Supine Lower Trunk Rotation  - 1 x daily - 7 x weekly - 1 sets - 10 reps - 10 hold - Supine Piriformis Stretch with Foot on Ground  - 1 x daily - 7 x weekly - 1 sets - 3 reps - 20-30 hold - Supine Figure 4 Piriformis Stretch  - 1 x daily - 7 x weekly - 1 sets - 3 reps - 20-30 hold - Supine Bridge with Resistance Band  - 1 x daily - 7 x weekly - 2 sets - 10 reps - 3 hold - Supine Pelvic Tilt with Straight Leg Raise  - 1 x daily - 7 x weekly - 2 sets - 10 reps - 3 hold - Bird Dog on Counter  - 1 x daily - 7 x weekly - 2 sets - 10 reps - 3 hold  ASSESSMENT:  CLINICAL IMPRESSION: PT today focused on pelvic/lower body flexibility and strengthening. Decreased core/pelvic stability was observed during bridges and SLRs. Skilled PT was provided for most effective techniques c exs and to monotor pt's response. Pt tolerated PT today without adverse effects. Pt's HEP was updated. Pt will continue to benefit from skilled PT to address impairments for improved function with minimized pain.  Patient is a 29 y.o. female who was seen today for physical therapy evaluation and treatment for chronic upper back and neck pain as well as low back and hip pain.  Symptoms attributed to chronic autoimmune disorder resulting in soft tissue irritation and inflammation.  Patient presents with full lumbar range of motion however cervical range of motion restricted in sidebending.  Range of motion and flexibility of lower  extremities is within normal limits with mild discomfort noted with IT band stretching.  32nd chair stand test finds lower extremity weakness with fair body mechanics observed.  Palpation of upper back findings soft tissue irritation and tightness to bilateral scalenes upper traps and levator scapular regions right greater than left.  Patient is a good candidate for outpatient physical therapy with the goal of developing a home exercise program for self-management.  Recommend a hybrid approach of land-based therapy for stretching and strengthening follow-up which would include aquatic therapy for pain management and functional activities.   OBJECTIVE IMPAIRMENTS: decreased activity tolerance, decreased endurance, decreased knowledge of condition, decreased mobility, decreased ROM, decreased strength, increased muscle spasms, improper body mechanics, postural dysfunction, and pain.   ACTIVITY LIMITATIONS: carrying, lifting, bending, sitting, standing, squatting, dressing, and locomotion level  PARTICIPATION LIMITATIONS: meal prep, cleaning, laundry, and community activity  PERSONAL FACTORS: Age, Fitness, Past/current experiences, Time since onset of injury/illness/exacerbation, and 1 comorbidity: auto immune disease are also affecting patient's functional outcome.   REHAB POTENTIAL: Fair based on chronicity and nature of symptoms  CLINICAL DECISION MAKING: Evolving/moderate complexity  EVALUATION COMPLEXITY: Moderate   GOALS: Goals reviewed with patient? No  SHORT TERM GOALS: Target date: 09/16/2024    Patient to demonstrate independence in HEP  Baseline: [facility] Goal status: ONGOING  2.  Initiate aquatic program Baseline:  TBD Goal status: INITIAL   LONG TERM GOALS: Target date: 10/14/2024  Patient will acknowledge 6/10 pain at least once during episode of care   Baseline: 10/10 worst pain Goal status: INITIAL  2.  Patient will increase 30s chair stand reps from 7 to 10  with/without arms to demonstrate and improved functional ability with less pain/difficulty as well as reduce fall risk.  Baseline: 7 Goal status: INITIAL  3.  Patient will score at least 20/30 on PSFS to signify clinically meaningful improvement in functional abilities.   Baseline: 12/30 Goal status: INITIAL  4.  Increase cervical B SB to 90% Baseline: 75% B Goal status: INITIAL    PLAN:  PT FREQUENCY: 1-2x/week  PT DURATION: 8 weeks  PLANNED INTERVENTIONS: 97110-Therapeutic exercises, 97530- Therapeutic activity, [id number]- Neuromuscular re-education, 97535- Self Care, [id number]- Manual therapy, 20560 (1-2 muscles), 20561 (3+ muscles)- Dry Needling, and Patient/Family education.  PLAN FOR NEXT SESSION: HEP review and update, manual techniques as appropriate, aerobic tasks, ROM and flexibility activities, strengthening and PREs, TPDN, gait and balance training as needed    For all possible CPT codes, reference the Planned Interventions line above.     Check all conditions that are expected to impact treatment: {Conditions expected to impact treatment:Musculoskeletal disorders and Associated genetic disorder   If treatment provided at initial evaluation, no treatment charged due to lack of authorization.      Dray Dente MS, PT 09/02/24 12:30 PM

## 2024-09-02 ENCOUNTER — Ambulatory Visit

## 2024-09-02 DIAGNOSIS — M7918 Myalgia, other site: Secondary | ICD-10-CM

## 2024-09-02 DIAGNOSIS — M5459 Other low back pain: Secondary | ICD-10-CM

## 2024-09-02 DIAGNOSIS — M6281 Muscle weakness (generalized): Secondary | ICD-10-CM

## 2024-09-07 ENCOUNTER — Ambulatory Visit: Admitting: Physical Therapy

## 2024-09-09 ENCOUNTER — Ambulatory Visit

## 2024-09-13 ENCOUNTER — Ambulatory Visit

## 2024-09-14 ENCOUNTER — Encounter: Payer: Self-pay | Admitting: Internal Medicine

## 2024-09-14 ENCOUNTER — Encounter: Payer: Self-pay | Admitting: Physical Therapy

## 2024-09-14 ENCOUNTER — Ambulatory Visit: Payer: Self-pay | Admitting: Physical Therapy

## 2024-09-14 DIAGNOSIS — M5459 Other low back pain: Secondary | ICD-10-CM | POA: Diagnosis not present

## 2024-09-14 DIAGNOSIS — M6281 Muscle weakness (generalized): Secondary | ICD-10-CM

## 2024-09-14 DIAGNOSIS — M7918 Myalgia, other site: Secondary | ICD-10-CM

## 2024-09-14 NOTE — Therapy (Signed)
 OUTPATIENT PHYSICAL THERAPY THORACOLUMBAR TREATMENT   Patient Name: Joanne Wallace MRN: [id number] DOB:[birth year], 28 y.o., female Today's Date: 09/14/2024  END OF SESSION:  PT End of Session - 09/14/24 1324     Visit Number 4    Number of Visits 8    Date for Recertification  10/19/24    Authorization Type [place]    PT Start Time 0125    PT Stop Time 0205    PT Time Calculation (min) 40 min    Activity Tolerance Patient tolerated treatment well    Behavior During Therapy [facility] for tasks assessed/performed             Past Medical History:  Diagnosis Date   Anxiety 2014   Depression 2014   IBS (irritable bowel syndrome)    Mixed connective tissue disease    Neuromuscular disorder (HCC) 2021   mixed connective tissue disease   Past Surgical History:  Procedure Laterality Date   HERNIA REPAIR     double   TONSILLECTOMY AND ADENOIDECTOMY     Patient Active Problem List   Diagnosis Date Noted   Restless leg 01/18/2024   Routine general medical examination at a health care facility 08/27/2023   Concentration deficit 03/17/2023   Bilateral knee pain 03/17/2023   Autoimmune disease 09/26/2020   Menstrual changes 05/07/2019   Fatigue 05/07/2019    PCP: Rollene Almarie LABOR, MD  REFERRING PROVIDER: Rollene Almarie LABOR, MD  REFERRING DIAG: M35.9 (ICD-10-CM) - Autoimmune disease (HCC)  Rationale for Evaluation and Treatment: Rehabilitation  THERAPY DIAG:  Other low back pain  Muscle pain, myofascial  Muscle weakness (generalized)  ONSET DATE: chronic  SUBJECTIVE:                                                                                                                                                                                           SUBJECTIVE STATEMENT:  Pt reports that she is feeling pretty good today.  Feels she overdid it a bit last visit.  PERTINENT HISTORY:  Chronic hip, neck, and shoulder pain   She experiences chronic severe pain  in her hip, neck, and shoulder, with previous treatments proving ineffective. Methocarbamol  provides relief but is not suitable for long-term use. Physical therapy has been beneficial but is costly, and dry needling is recommended but cost-prohibitive. A TENS unit is suggested as a non-invasive option. Refer her to physical therapy for an integrative treatment approach. Recommend purchasing a TENS unit for home use to manage pain. Encourage continuation of bursitis stretches and exercises.  PAIN:  Are you having pain? Yes: NPRS scale: 10/10. Currently: 4/10 Pain location: global Pain  description: ache Aggravating factors: strenuous activity,  Relieving factors: TENS  PRECAUTIONS: None  RED FLAGS: None   WEIGHT BEARING RESTRICTIONS: No  FALLS:  Has patient fallen in last 6 months? No  OCCUPATION: hospitality  PLOF: Independent  PATIENT GOALS: To manage my pain symptoms  NEXT MD VISIT: TBD  OBJECTIVE:  Note: Objective measures were completed at Evaluation unless otherwise noted.  DIAGNOSTIC FINDINGS:  none  PATIENT SURVEYS:  Patient-specific activity scoring scheme (Point to one number):  0 represents "unable to perform." 10 represents "able to perform at prior level. 0 1 2 3 4 5 6 7 8 9  10 (Date and Score) Activity Initial  Activity Eval     Cooking/cleaning  5    Hiking/walking  3    travel 4     Total score = sum of the activity scores/number of activities Minimum detectable change (90%CI) for average score = 2 points Minimum detectable change (90%CI) for single activity score = 3 points PSFS developed by: Rosalee MYRTIS Marvis KYM Charlet CHRISTELLA., & Binkley, J. (1995). Assessing disability and change on individual  patients: a report of a patient specific measure. Physiotherapy [place], 47, [phone]. Reproduced with the permission of the authors  Score: 12/30  MUSCLE LENGTH: Hamstrings: Right 90 deg; Left 90 deg Thomas test: PKB unremarkable B  POSTURE:  rounded shoulders and elevated R shoulder, scapular winging B  PALPATION: TTP B UT and levators  LUMBAR ROM:   AROM eval  Flexion 100%  Extension 100%  Right lateral flexion 100%  Left lateral flexion 100%  Right rotation 100%  Left rotation 100%   (Blank rows = not tested) CERVICAL ROM:   Active ROM A/PROM (deg) eval  Flexion 90%  Extension 90%  Right lateral flexion 75%  Left lateral flexion 75%  Right rotation 90%  Left rotation 90%   (Blank rows = not tested)   LOWER EXTREMITY ROM:   WNL  Active  Right eval Left eval  Hip flexion    Hip extension    Hip abduction    Hip adduction    Hip internal rotation    Hip external rotation    Knee flexion    Knee extension    Ankle dorsiflexion    Ankle plantarflexion    Ankle inversion    Ankle eversion     (Blank rows = not tested)  LOWER EXTREMITY MMT:  see 30s chair stand test  MMT Right eval Left eval  Hip flexion    Hip extension    Hip abduction    Hip adduction    Hip internal rotation    Hip external rotation    Knee flexion    Knee extension    Ankle dorsiflexion    Ankle plantarflexion    Ankle inversion    Ankle eversion     (Blank rows = not tested)  LUMBAR SPECIAL TESTS:  Straight leg raise test: Negative and Slump test: Negative  FUNCTIONAL TESTS:  30 seconds chair stand test 7  GAIT: Distance walked: 47ft x2 Assistive device utilized: None Level of assistance: Complete Independence Comments: unremarkable  TREATMENT:  TREATMENT 09/14/24:  Aquatic therapy at [facility]- [address] - therapeutic pool temp 90-92 degrees   Aquatic Therapy:  Water walking for warm up fwd/lat/bkwds  HS stretch Piriformis stretch Hip flexor stretch Standing hip abd Squats Heel raises  HS curl  STS from bench Step up fwd and lat Lat walking with DB Bil shoulder ext with water bells Alt  shoulder ext with water bells NBOS walking with DB  Pt requires the buoyancy of water for  active assisted exercises with buoyancy supported for strengthening and AROM exercises. Hydrostatic pressure also supports joints by unweighting joint load by at least 50 % in 3-4 feet depth water. 80% in chest to neck deep water. Water will provide assistance with movement using the current and laminar flow while the buoyancy reduces weight bearing. Pt requires the viscosity of the water for resistance with strengthening exercises.   [facility] Adult PT Treatment:                                                DATE: 09/02/24 Therapeutic Exercise: Rec bike x5 mins L3 Supine Lower Trunk Rotation  x10 5 Supine Piriformis Stretch x2 30 each Supine Figure 4 Piriformis Stretch x2 30 each Supine Bridge with Resistance Band x10 3 each Clamshell  x2 10 RTB PPT x10 3 Supine Pelvic Tilt with Straight Leg Raise and ball press x10 3 Hip ext at Counter x10 RTB Updated HEP  [facility] Adult PT Treatment:                                                DATE: 08/31/24 Therapeutic Exercise: Supine chin tucks x5 3' Seated scapular retractions x5 5 Pectoral stretch 90d x3 30 Seated Upper Trapezius Stretch  x2 15 Seated Shoulder Horizontal Abduction star pattern x2 5 patterns RTB Clamshell  x2 10 RTB Sit to Stand with Arms Crossed x2 5 Self Care: Instructed in use of tennis ball on wall for massage and theracane for massage with pt returning demonstration Instructed proper sitting posture with and without lumbar support                                                                                                                           [facility] Adult PT Treatment:                                                DATE: 08/18/24  Self Care: Additional minutes spent for educating on updated Therapeutic Home Exercise Program as well as comparing current status to condition at start of symptoms. This included exercises focusing on stretching, strengthening, with focus on eccentric aspects. Long term goals include an  improvement in range of motion, strength, endurance as well as avoiding reinjury. Patient's frequency would include in 1-2 times a day, 3-5 times a week for a duration of 6-12 weeks. Proper technique shown and discussed handout in great detail. All questions were discussed and addressed.  PATIENT EDUCATION:  Education details: Discussed eval findings, rehab rationale and POC and patient is in agreement  Person educated: Patient Education method: Explanation and Handouts Education comprehension: verbalized understanding and needs further education  HOME EXERCISE PROGRAM: Access Code: [facility] URL: [email or web address] Date: 09/02/2024 Prepared by: Dasie Daft  Exercises - Seated Upper Trapezius Stretch  - 1-2 x daily - 5 x weekly - 1 sets - 30s hold - Seated Shoulder Horizontal Abduction with Resistance  - 1-2 x daily - 5 x weekly - 2 sets - 10 reps - Clamshell  - 1-2 x daily - 5 x weekly - 2 sets - 10 reps - Sit to Stand with Arms Crossed  - 1-2 x daily - 5 x weekly - 2 sets - 5 reps - Supine Cervical Retraction with Towel  - 1 x daily - 7 x weekly - 1 sets - 10 reps - 3 hold - Seated Cervical Retraction  - 6 x daily - 7 x weekly - 1 sets - 3 reps - 3 hold - Doorway Pec Stretch at 90 Degrees Abduction  - 1 x daily - 7 x weekly - 1 sets - 3 reps - 30 hold - Seated Scapular Retraction  - 6 x daily - 7 x weekly - 3 sets - 3-5 reps - 3 hold - Supine Lower Trunk Rotation  - 1 x daily - 7 x weekly - 1 sets - 10 reps - 10 hold - Supine Piriformis Stretch with Foot on Ground  - 1 x daily - 7 x weekly - 1 sets - 3 reps - 20-30 hold - Supine Figure 4 Piriformis Stretch  - 1 x daily - 7 x weekly - 1 sets - 3 reps - 20-30 hold - Supine Bridge with Resistance Band  - 1 x daily - 7 x weekly - 2 sets - 10 reps - 3 hold - Supine Pelvic Tilt with Straight Leg Raise  - 1 x daily - 7 x weekly - 2 sets - 10 reps - 3 hold - Bird Dog on Counter  - 1 x daily - 7 x weekly - 2 sets - 10 reps -  3 hold  ASSESSMENT:  CLINICAL IMPRESSION: Session today focused on hip and shoulder strengthening in the aquatic environment for use of buoyancy to offload joints and the viscosity of water as resistance during therapeutic exercise.  Pt reports no pain in session and mild/mod fatigue with STS and noodle stomp.  Intensity kept relatively low to gauge response given soreness after last visit. Patient was able to tolerate all prescribed exercises in the aquatic environment with no adverse effects and reports no increase pain at the end of the session. Patient continues to benefit from skilled PT services on land and aquatic based and should be progressed as able to improve functional independence.    Patient is a 28 y.o. female who was seen today for physical therapy evaluation and treatment for chronic upper back and neck pain as well as low back and hip pain.  Symptoms attributed to chronic autoimmune disorder resulting in soft tissue irritation and inflammation.  Patient presents with full lumbar range of motion however cervical range of motion restricted in sidebending.  Range of motion and flexibility of lower extremities is within normal limits with mild discomfort noted with IT band stretching.  32nd chair stand test finds lower extremity weakness with fair body mechanics observed.  Palpation of upper back findings soft tissue irritation and tightness to bilateral scalenes upper  traps and levator scapular regions right greater than left.  Patient is a good candidate for outpatient physical therapy with the goal of developing a home exercise program for self-management.  Recommend a hybrid approach of land-based therapy for stretching and strengthening follow-up which would include aquatic therapy for pain management and functional activities.   OBJECTIVE IMPAIRMENTS: decreased activity tolerance, decreased endurance, decreased knowledge of condition, decreased mobility, decreased ROM, decreased  strength, increased muscle spasms, improper body mechanics, postural dysfunction, and pain.   ACTIVITY LIMITATIONS: carrying, lifting, bending, sitting, standing, squatting, dressing, and locomotion level  PARTICIPATION LIMITATIONS: meal prep, cleaning, laundry, and community activity  PERSONAL FACTORS: Age, Fitness, Past/current experiences, Time since onset of injury/illness/exacerbation, and 1 comorbidity: auto immune disease are also affecting patient's functional outcome.   REHAB POTENTIAL: Fair based on chronicity and nature of symptoms  CLINICAL DECISION MAKING: Evolving/moderate complexity  EVALUATION COMPLEXITY: Moderate   GOALS: Goals reviewed with patient? No  SHORT TERM GOALS: Target date: 09/16/2024    Patient to demonstrate independence in HEP  Baseline: [facility] Goal status: ONGOING  2.  Initiate aquatic program Baseline: TBD Goal status: INITIAL   LONG TERM GOALS: Target date: 10/14/2024  Patient will acknowledge 6/10 pain at least once during episode of care   Baseline: 10/10 worst pain Goal status: INITIAL  2.  Patient will increase 30s chair stand reps from 7 to 10 with/without arms to demonstrate and improved functional ability with less pain/difficulty as well as reduce fall risk.  Baseline: 7 Goal status: INITIAL  3.  Patient will score at least 20/30 on PSFS to signify clinically meaningful improvement in functional abilities.   Baseline: 12/30 Goal status: INITIAL  4.  Increase cervical B SB to 90% Baseline: 75% B Goal status: INITIAL    PLAN:  PT FREQUENCY: 1-2x/week  PT DURATION: 8 weeks  PLANNED INTERVENTIONS: 97110-Therapeutic exercises, 97530- Therapeutic activity, [id number]- Neuromuscular re-education, 97535- Self Care, [id number]- Manual therapy, 20560 (1-2 muscles), 20561 (3+ muscles)- Dry Needling, and Patient/Family education.  PLAN FOR NEXT SESSION: HEP review and update, manual techniques as appropriate, aerobic tasks, ROM and  flexibility activities, strengthening and PREs, TPDN, gait and balance training as needed    For all possible CPT codes, reference the Planned Interventions line above.     Check all conditions that are expected to impact treatment: {Conditions expected to impact treatment:Musculoskeletal disorders and Associated genetic disorder   If treatment provided at initial evaluation, no treatment charged due to lack of authorization.      Zaheer Wageman E Gracelee Stemmler PT 09/14/24 2:15 PM

## 2024-09-15 ENCOUNTER — Ambulatory Visit

## 2024-09-20 ENCOUNTER — Ambulatory Visit

## 2024-09-20 NOTE — Therapy (Incomplete)
 OUTPATIENT PHYSICAL THERAPY THORACOLUMBAR TREATMENT   Patient Name: Pyper Olexa MRN: [id number] DOB:[birth year], 28 y.o., female Today's Date: 09/20/2024  END OF SESSION:       Past Medical History:  Diagnosis Date   Anxiety 2014   Depression 2014   IBS (irritable bowel syndrome)    Mixed connective tissue disease    Neuromuscular disorder (HCC) 2021   mixed connective tissue disease   Past Surgical History:  Procedure Laterality Date   HERNIA REPAIR     double   TONSILLECTOMY AND ADENOIDECTOMY     Patient Active Problem List   Diagnosis Date Noted   Restless leg 01/18/2024   Routine general medical examination at a health care facility 08/27/2023   Concentration deficit 03/17/2023   Bilateral knee pain 03/17/2023   Autoimmune disease 09/26/2020   Menstrual changes 05/07/2019   Fatigue 05/07/2019    PCP: Rollene Almarie LABOR, MD  REFERRING PROVIDER: Rollene Almarie LABOR, MD  REFERRING DIAG: M35.9 (ICD-10-CM) - Autoimmune disease [facility])  Rationale for Evaluation and Treatment: Rehabilitation  THERAPY DIAG:  No diagnosis found.  ONSET DATE: chronic  SUBJECTIVE:                                                                                                                                                                                           SUBJECTIVE STATEMENT:  ***  PERTINENT HISTORY:  Chronic hip, neck, and shoulder pain   She experiences chronic severe pain in her hip, neck, and shoulder, with previous treatments proving ineffective. Methocarbamol  provides relief but is not suitable for long-term use. Physical therapy has been beneficial but is costly, and dry needling is recommended but cost-prohibitive. A TENS unit is suggested as a non-invasive option. Refer her to physical therapy for an integrative treatment approach. Recommend purchasing a TENS unit for home use to manage pain. Encourage continuation of bursitis stretches and  exercises.  PAIN:  Are you having pain? Yes: NPRS scale: 10/10. Currently: 4/10 Pain location: global Pain description: ache Aggravating factors: strenuous activity,  Relieving factors: TENS  PRECAUTIONS: None  RED FLAGS: None   WEIGHT BEARING RESTRICTIONS: No  FALLS:  Has patient fallen in last 6 months? No  OCCUPATION: hospitality  PLOF: Independent  PATIENT GOALS: To manage my pain symptoms  NEXT MD VISIT: TBD  OBJECTIVE:  Note: Objective measures were completed at Evaluation unless otherwise noted.  DIAGNOSTIC FINDINGS:  none  PATIENT SURVEYS:  Patient-specific activity scoring scheme (Point to one number):  0 represents "unable to perform." 10 represents "able to perform at prior level. 0 1 2 3 4 5 6 7 8 9  10 (Date and  Score) Activity Initial  Activity Eval     Cooking/cleaning  5    Hiking/walking  3    travel 4     Total score = sum of the activity scores/number of activities Minimum detectable change (90%CI) for average score = 2 points Minimum detectable change (90%CI) for single activity score = 3 points PSFS developed by: Rosalee MYRTIS Marvis KYM Charlet CHRISTELLA., & Binkley, J. (1995). Assessing disability and change on individual  patients: a report of a patient specific measure. Physiotherapy [place], 47, [phone]. Reproduced with the permission of the authors  Score: 12/30  MUSCLE LENGTH: Hamstrings: Right 90 deg; Left 90 deg Thomas test: PKB unremarkable B  POSTURE: rounded shoulders and elevated R shoulder, scapular winging B  PALPATION: TTP B UT and levators  LUMBAR ROM:   AROM eval  Flexion 100%  Extension 100%  Right lateral flexion 100%  Left lateral flexion 100%  Right rotation 100%  Left rotation 100%   (Blank rows = not tested) CERVICAL ROM:   Active ROM A/PROM (deg) eval  Flexion 90%  Extension 90%  Right lateral flexion 75%  Left lateral flexion 75%  Right rotation 90%  Left rotation 90%   (Blank rows = not  tested)   LOWER EXTREMITY ROM:   WNL  Active  Right eval Left eval  Hip flexion    Hip extension    Hip abduction    Hip adduction    Hip internal rotation    Hip external rotation    Knee flexion    Knee extension    Ankle dorsiflexion    Ankle plantarflexion    Ankle inversion    Ankle eversion     (Blank rows = not tested)  LOWER EXTREMITY MMT:  see 30s chair stand test  MMT Right eval Left eval  Hip flexion    Hip extension    Hip abduction    Hip adduction    Hip internal rotation    Hip external rotation    Knee flexion    Knee extension    Ankle dorsiflexion    Ankle plantarflexion    Ankle inversion    Ankle eversion     (Blank rows = not tested)  LUMBAR SPECIAL TESTS:  Straight leg raise test: Negative and Slump test: Negative  FUNCTIONAL TESTS:  30 seconds chair stand test 7  GAIT: Distance walked: 41ft x2 Assistive device utilized: None Level of assistance: Complete Independence Comments: unremarkable  TREATMENT:  [facility] Adult PT Treatment:                                                DATE: 09/20/2024  Therapeutic Exercise: Rec bike x5 mins L3 Supine Lower Trunk Rotation  x10 5 Supine Piriformis Stretch x2 30 each Supine Figure 4 Piriformis Stretch x2 30 each Supine Bridge with Resistance Band x10 3 each Clamshell  x2 10 RTB PPT x10 3 Supine Pelvic Tilt with Straight Leg Raise and ball press x10 3 Hip ext at Counter x10 RTB Updated HEP   TREATMENT 09/14/24:  Aquatic therapy at [facility]- [address] - therapeutic pool temp 90-92 degrees   Aquatic Therapy:  Water walking for warm up fwd/lat/bkwds  HS stretch Piriformis stretch Hip flexor stretch Standing hip abd Squats Heel raises  HS curl  STS from bench Step up fwd and lat Lat  walking with DB Bil shoulder ext with water bells Alt shoulder ext with water bells NBOS walking with DB  Pt requires the buoyancy of water for active assisted exercises with  buoyancy supported for strengthening and AROM exercises. Hydrostatic pressure also supports joints by unweighting joint load by at least 50 % in 3-4 feet depth water. 80% in chest to neck deep water. Water will provide assistance with movement using the current and laminar flow while the buoyancy reduces weight bearing. Pt requires the viscosity of the water for resistance with strengthening exercises.   [facility] Adult PT Treatment:                                                DATE: 09/02/24 Therapeutic Exercise: Rec bike x5 mins L3 Supine Lower Trunk Rotation  x10 5 Supine Piriformis Stretch x2 30 each Supine Figure 4 Piriformis Stretch x2 30 each Supine Bridge with Resistance Band x10 3 each Clamshell  x2 10 RTB PPT x10 3 Supine Pelvic Tilt with Straight Leg Raise and ball press x10 3 Hip ext at Counter x10 RTB Updated HEP  [facility] Adult PT Treatment:                                                DATE: 08/31/24 Therapeutic Exercise: Supine chin tucks x5 3' Seated scapular retractions x5 5 Pectoral stretch 90d x3 30 Seated Upper Trapezius Stretch  x2 15 Seated Shoulder Horizontal Abduction star pattern x2 5 patterns RTB Clamshell  x2 10 RTB Sit to Stand with Arms Crossed x2 5 Self Care: Instructed in use of tennis ball on wall for massage and theracane for massage with pt returning demonstration Instructed proper sitting posture with and without lumbar support                                                                                                                           [facility] Adult PT Treatment:                                                DATE: 08/18/24  Self Care: Additional minutes spent for educating on updated Therapeutic Home Exercise Program as well as comparing current status to condition at start of symptoms. This included exercises focusing on stretching, strengthening, with focus on eccentric aspects. Long term goals include an improvement in range of motion,  strength, endurance as well as avoiding reinjury. Patient's frequency would include in 1-2 times a day, 3-5 times a week for a duration of 6-12 weeks. Proper technique shown and discussed handout  in great detail. All questions were discussed and addressed.      PATIENT EDUCATION:  Education details: Discussed eval findings, rehab rationale and POC and patient is in agreement  Person educated: Patient Education method: Explanation and Handouts Education comprehension: verbalized understanding and needs further education  HOME EXERCISE PROGRAM: Access Code: [facility] URL: [email or web address] Date: 09/02/2024 Prepared by: Dasie Daft  Exercises - Seated Upper Trapezius Stretch  - 1-2 x daily - 5 x weekly - 1 sets - 30s hold - Seated Shoulder Horizontal Abduction with Resistance  - 1-2 x daily - 5 x weekly - 2 sets - 10 reps - Clamshell  - 1-2 x daily - 5 x weekly - 2 sets - 10 reps - Sit to Stand with Arms Crossed  - 1-2 x daily - 5 x weekly - 2 sets - 5 reps - Supine Cervical Retraction with Towel  - 1 x daily - 7 x weekly - 1 sets - 10 reps - 3 hold - Seated Cervical Retraction  - 6 x daily - 7 x weekly - 1 sets - 3 reps - 3 hold - Doorway Pec Stretch at 90 Degrees Abduction  - 1 x daily - 7 x weekly - 1 sets - 3 reps - 30 hold - Seated Scapular Retraction  - 6 x daily - 7 x weekly - 3 sets - 3-5 reps - 3 hold - Supine Lower Trunk Rotation  - 1 x daily - 7 x weekly - 1 sets - 10 reps - 10 hold - Supine Piriformis Stretch with Foot on Ground  - 1 x daily - 7 x weekly - 1 sets - 3 reps - 20-30 hold - Supine Figure 4 Piriformis Stretch  - 1 x daily - 7 x weekly - 1 sets - 3 reps - 20-30 hold - Supine Bridge with Resistance Band  - 1 x daily - 7 x weekly - 2 sets - 10 reps - 3 hold - Supine Pelvic Tilt with Straight Leg Raise  - 1 x daily - 7 x weekly - 2 sets - 10 reps - 3 hold - Bird Dog on Counter  - 1 x daily - 7 x weekly - 2 sets - 10 reps - 3  hold  ASSESSMENT:  CLINICAL IMPRESSION: 09/20/2024 ***   Patient is a 28 y.o. female who was seen today for physical therapy evaluation and treatment for chronic upper back and neck pain as well as low back and hip pain.  Symptoms attributed to chronic autoimmune disorder resulting in soft tissue irritation and inflammation.  Patient presents with full lumbar range of motion however cervical range of motion restricted in sidebending.  Range of motion and flexibility of lower extremities is within normal limits with mild discomfort noted with IT band stretching.  32nd chair stand test finds lower extremity weakness with fair body mechanics observed.  Palpation of upper back findings soft tissue irritation and tightness to bilateral scalenes upper traps and levator scapular regions right greater than left.  Patient is a good candidate for outpatient physical therapy with the goal of developing a home exercise program for self-management.  Recommend a hybrid approach of land-based therapy for stretching and strengthening follow-up which would include aquatic therapy for pain management and functional activities.   OBJECTIVE IMPAIRMENTS: decreased activity tolerance, decreased endurance, decreased knowledge of condition, decreased mobility, decreased ROM, decreased strength, increased muscle spasms, improper body mechanics, postural dysfunction, and pain.   ACTIVITY LIMITATIONS: carrying, lifting, bending, sitting, standing,  squatting, dressing, and locomotion level  PARTICIPATION LIMITATIONS: meal prep, cleaning, laundry, and community activity  PERSONAL FACTORS: Age, Fitness, Past/current experiences, Time since onset of injury/illness/exacerbation, and 1 comorbidity: auto immune disease are also affecting patient's functional outcome.   REHAB POTENTIAL: Fair based on chronicity and nature of symptoms  CLINICAL DECISION MAKING: Evolving/moderate complexity  EVALUATION COMPLEXITY:  Moderate   GOALS: Goals reviewed with patient? No  SHORT TERM GOALS: Target date: 09/16/2024    Patient to demonstrate independence in HEP  Baseline: [facility] Goal status: ONGOING  2.  Initiate aquatic program Baseline: TBD Goal status: INITIAL   LONG TERM GOALS: Target date: 10/14/2024  Patient will acknowledge 6/10 pain at least once during episode of care   Baseline: 10/10 worst pain Goal status: INITIAL  2.  Patient will increase 30s chair stand reps from 7 to 10 with/without arms to demonstrate and improved functional ability with less pain/difficulty as well as reduce fall risk.  Baseline: 7 Goal status: INITIAL  3.  Patient will score at least 20/30 on PSFS to signify clinically meaningful improvement in functional abilities.   Baseline: 12/30 Goal status: INITIAL  4.  Increase cervical B SB to 90% Baseline: 75% B Goal status: INITIAL    PLAN:  PT FREQUENCY: 1-2x/week  PT DURATION: 8 weeks  PLANNED INTERVENTIONS: 97110-Therapeutic exercises, 97530- Therapeutic activity, [id number]- Neuromuscular re-education, 97535- Self Care, 02859- Manual therapy, 20560 (1-2 muscles), 20561 (3+ muscles)- Dry Needling, and Patient/Family education.  PLAN FOR NEXT SESSION: HEP review and update, manual techniques as appropriate, aerobic tasks, ROM and flexibility activities, strengthening and PREs, TPDN, gait and balance training as needed    For all possible CPT codes, reference the Planned Interventions line above.     Check all conditions that are expected to impact treatment: {Conditions expected to impact treatment:Musculoskeletal disorders and Associated genetic disorder   If treatment provided at initial evaluation, no treatment charged due to lack of authorization.      Marko Molt, PT, DPT  09/20/2024 7:33 AM

## 2024-09-21 MED ORDER — HYDROXYZINE HCL 10 MG PO TABS
10.0000 mg | ORAL_TABLET | Freq: Three times a day (TID) | ORAL | 0 refills | Status: AC | PRN
Start: 2024-09-21 — End: ?

## 2024-09-22 ENCOUNTER — Ambulatory Visit

## 2024-09-29 ENCOUNTER — Ambulatory Visit

## 2024-09-30 ENCOUNTER — Ambulatory Visit: Payer: Self-pay

## 2024-10-17 ENCOUNTER — Ambulatory Visit (INDEPENDENT_AMBULATORY_CARE_PROVIDER_SITE_OTHER): Admitting: Obstetrics and Gynecology

## 2024-10-17 ENCOUNTER — Encounter: Payer: Self-pay | Admitting: Internal Medicine

## 2024-10-17 ENCOUNTER — Encounter: Payer: Self-pay | Admitting: Obstetrics and Gynecology

## 2024-10-17 VITALS — BP 130/80 | HR 101 | Ht 65.16 in | Wt 190.0 lb

## 2024-10-17 DIAGNOSIS — N946 Dysmenorrhea, unspecified: Secondary | ICD-10-CM

## 2024-10-17 DIAGNOSIS — N938 Other specified abnormal uterine and vaginal bleeding: Secondary | ICD-10-CM | POA: Diagnosis not present

## 2024-10-17 DIAGNOSIS — Z01818 Encounter for other preprocedural examination: Secondary | ICD-10-CM

## 2024-10-17 DIAGNOSIS — N921 Excessive and frequent menstruation with irregular cycle: Secondary | ICD-10-CM

## 2024-10-17 MED ORDER — METOCLOPRAMIDE HCL 10 MG PO TABS
10.0000 mg | ORAL_TABLET | Freq: Three times a day (TID) | ORAL | 0 refills | Status: AC | PRN
Start: 2024-10-17 — End: ?

## 2024-10-17 MED ORDER — OXYCODONE HCL 5 MG PO TABS: 5.0000 mg | ORAL_TABLET | ORAL | 0 refills | Status: AC | PRN

## 2024-10-17 MED ORDER — IMVEXXY MAINTENANCE PACK 10 MCG VA INST: 1.0000 | VAGINAL_INSERT | VAGINAL | 6 refills | Status: AC

## 2024-10-17 MED ORDER — IBUPROFEN 800 MG PO TABS: 800.0000 mg | ORAL_TABLET | Freq: Three times a day (TID) | ORAL | 1 refills | Status: AC | PRN

## 2024-10-17 NOTE — H&P (View-Only) (Signed)
 Acute Office Visit  Subjective:    Patient ID: Joanne Wallace, female    DOB: July 02, 1996, 28 y.o.   MRN: [id number]  PREOP for RLH, bilateral salpingectomy, cystoscopy  HPI 28 y.o. presents today for Pre-op Exam (Pre-op/Pap-01/16/23/11/11/24- Total Hysterectomy ) .  Patient's last menstrual period was 10/04/2024 (exact date). Period Duration (Days): 6 Menstrual Flow: Heavy Menstrual Control: Other (Comment) (period underwear) Dysmenorrhea: (!) Severe (Mild to severe) Dysmenorrhea Symptoms: Cramping, Nausea, Diarrhea, Headache  Patient is a surgical consult from J[facility] C. Patient with severe debilitating periods when she has them. Has heavy periods and can bleed twice a month or longer. She has tried ocp's in the past with no resolution or improvement of her symptoms. Has dyspareunia and her and her partner would not like to have any children. Her family has been aware of this as well for a long while that she did not want kids and has medical issues and does not want to have complications in an undesired pregnancy. Periods and the pain have affected her quality of life. Patient counseled that the hysterectomy is permanent and she will no longer be able to conceive, and she voiced understanding. Patient also with vagal responses with any cervical exam or during intercourse. She avoids intercourse because of the discomfort. GYN exams are extremely difficult as well and she feels removing the cervix with the hysterectomy will be beneficial. Patient counseled her pain could still continue, even with a hysterectomy and she is willing to take the risk.  Review of Systems     Objective:    Physical Exam Constitutional:      Appearance: Normal appearance.  Musculoskeletal:     Cervical back: Normal range of motion.  Neurological:     Mental Status: She is alert and oriented to person, place, and time.  Psychiatric:        Mood and Affect: Mood normal.        Behavior: Behavior  normal.  Vitals and nursing note reviewed.     BP 130/80   Pulse (!) 101   Ht 5' 5.16 (1.655 m)   Wt 190 lb (86.2 kg)   LMP 10/04/2024 (Exact Date)   SpO2 97%   BMI 31.47 kg/m  Wt Readings from Last 3 Encounters:  10/17/24 190 lb (86.2 kg)  08/03/24 188 lb 6.4 oz (85.5 kg)  07/21/24 190 lb (86.2 kg)       OB History     Gravida  0   Para  0   Term  0   Preterm  0   AB  0   Living  0      SAB  0   IAB  0   Ectopic  0   Multiple  0   Live Births  0          Past Surgical History:  Procedure Laterality Date   HERNIA REPAIR     double   TONSILLECTOMY AND ADENOIDECTOMY     Past Medical History:  Diagnosis Date   Anxiety 2014   Depression 2014   IBS (irritable bowel syndrome)    Mixed connective tissue disease    Neuromuscular disorder (HCC) 2021   mixed connective tissue disease   Social History   Socioeconomic History   Marital status: Single    Spouse name: Not on file   Number of children: Not on file   Years of education: Not on file   Highest education level: Associate degree: occupational, scientist, product/process development, or  vocational program  Occupational History   Not on file  Tobacco Use   Smoking status: Former    Current packs/day: 0.00    Average packs/day: 0.3 packs/day for 7.0 years (1.8 ttl pk-yrs)    Types: Cigarettes    Start date: 2012    Quit date: 2019    Years since quitting: 6.8   Smokeless tobacco: Never   Tobacco comments:    Only tough when I am around smokers, but I've never had a craving I couldnt handle.  Vaping Use   Vaping status: Former  Substance and Sexual Activity   Alcohol use: Yes    Alcohol/week: 6.0 - 12.0 standard drinks of alcohol    Types: 6 - 12 Standard drinks or equivalent per week    Comment: 1 drink daily   Drug use: Yes    Frequency: 7.0 times per week    Types: Marijuana   Sexual activity: Yes    Partners: Male    Birth control/protection: Condom    Comment: menarche 28yo, sexual debut 28yo   Other Topics Concern   Not on file  Social History Narrative   Not on file   Social Drivers of Health   Financial Resource Strain: Medium Risk (01/14/2024)   Overall Financial Resource Strain (CARDIA)    Difficulty of Paying Living Expenses: Somewhat hard  Food Insecurity: Food Insecurity Present (01/14/2024)   Hunger Vital Sign    Worried About Running Out of Food in the Last Year: Sometimes true    Ran Out of Food in the Last Year: Sometimes true  Transportation Needs: Unmet Transportation Needs (01/14/2024)   PRAPARE - Transportation    Lack of Transportation (Medical): Yes    Lack of Transportation (Non-Medical): Yes  Physical Activity: Insufficiently Active (01/14/2024)   Exercise Vital Sign    Days of Exercise per Week: 3 days    Minutes of Exercise per Session: 30 min  Stress: Stress Concern Present (01/14/2024)   [employer] of Occupational Health - Occupational Stress Questionnaire    Feeling of Stress : Rather much  Social Connections: Moderately Isolated (01/14/2024)   Social Connection and Isolation Panel    Frequency of Communication with Friends and Family: Twice a week    Frequency of Social Gatherings with Friends and Family: Twice a week    Attends Religious Services: Never    Diplomatic Services Operational Officer: No    Attends Engineer, Structural: Not on file    Marital Status: Living with partner  Intimate Partner Violence: Not on file   Family History  Problem Relation Age of Onset   Breast cancer Mother    Hypothyroidism Mother    Depression Mother    Obesity Mother    Hypothyroidism Father    Obesity Father    Gout Father    Healthy Brother    Rheum arthritis Paternal Aunt    Osteoarthritis Paternal Aunt    Arthritis Maternal Grandmother    Diabetes Paternal Grandmother    Obesity Paternal Grandfather    Colon cancer Neg Hx    Rectal cancer Neg Hx    Current Outpatient Medications on File Prior to Visit  Medication Sig  Dispense Refill   Cyanocobalamin  (B-12 PO) Take by mouth.     DULoxetine  (CYMBALTA ) 20 MG capsule Take 2 capsules (40 mg total) by mouth daily. 180 capsule 3   hydroxychloroquine  (PLAQUENIL ) 200 MG tablet TAKE 1 TABLET BY MOUTH TWICE DAILY MONDAY THROUGH FRIDAY ONLY 120 tablet  0   hydrOXYzine (ATARAX) 10 MG tablet Take 1 tablet (10 mg total) by mouth 3 (three) times daily as needed. 30 tablet 0   methocarbamol  (ROBAXIN ) 500 MG tablet TAKE 1 TABLET BY MOUTH EVERY DAY AS NEEDED 30 tablet 0   Multiple Vitamin (MULTIVITAMIN WITH MINERALS) TABS tablet Take 1 tablet by mouth daily.     Probiotic Product (PROBIOTIC PO) Take by mouth. Probiotic/prebiotic     thiamine  (VITAMIN B1) 100 MG tablet Take 1 tablet (100 mg total) by mouth daily. 90 tablet 3   Cyanocobalamin  (VITAMIN B 12 PO) Take by mouth daily. (Patient not taking: Reported on 10/17/2024)     dicyclomine  (BENTYL ) 10 MG capsule Take 1 capsule (10 mg total) by mouth 4 (four) times daily -  before meals and at bedtime. (Patient not taking: Reported on 10/17/2024) 90 capsule 2   estradiol  (ESTRACE  VAGINAL) 0.1 MG/GM vaginal cream Place 1 g vaginally 3 (three) times a week. As well as a pea sized amount to upper vulva and clitoris (Patient not taking: Reported on 10/17/2024) 127.5 g 3   metroNIDAZOLE  (FLAGYL ) 500 MG tablet Take 1 tablet (500 mg total) by mouth 2 (two) times daily. (Patient not taking: Reported on 10/17/2024) 14 tablet 0   oxybutynin  (DITROPAN ) 5 MG tablet Take 1 tablet (5 mg total) by mouth every 8 (eight) hours as needed for bladder spasms. (Patient not taking: Reported on 10/17/2024) 30 tablet 0   rOPINIRole  (REQUIP ) 0.5 MG tablet TAKE 1 TABLET BY MOUTH AT BEDTIME. (Patient not taking: Reported on 10/17/2024) 30 tablet 1   No current facility-administered medications on file prior to visit.   .  Assessment & Plan:  PREOP H&P for RLH, bilateral salpingectomy, cystoscopy Dysmenorrhea DUB Cervical pain Dyspareunia Difficulty  with pelvic exams Desires definitive management with RLH Likely endometriosis  Counseled on all options.  She would like to have the [facility].  Counseled extensively on the procedure including but not limited to what to expect and risks and benefits.  Counseled on postop care and pelvic rest for 10 weeks after the surgery with restricted lifting for 6 weeks after.  Counseled on the benefits of the robotic procedure with faster return to daily activities, improved outcomes, and less risk for complications.  Discussed risk of still having pain in the future and risk for additional surgeries in the future, if endometriosis is found. Discussed removal of as much lesions as possible, if in a safe areas.  She again voiced that she understands the hysterectomy is permanent and she cannot conceive in the future and wanted to proceed with the procedure.   The risks of surgery were discussed in detail with the patient including but not limited to: bleeding which may require transfusion or reoperation; infection which may require prolonged hospitalization or re-hospitalization and antibiotic therapy; injury to bowel, bladder, ureters and major vessels or other surrounding organs which may lead to other procedures; formation of adhesions; need for additional procedures including laparotomy or subsequent procedures secondary to intraoperative injury or abnormal pathology; thromboembolic phenomenon; incisional problems and other postoperative or anesthesia complications.  The postoperative expectations were also discussed in detail. The patient also understands the alternative treatment options which were discussed in full. All questions were answered.  Patient would like to proceed with the procedure.  Dr. Glennon   30 minutes spent on reviewing records, imaging,  and one on one patient time and counseling patient and documentation   Almarie MARLA Glennon

## 2024-10-17 NOTE — Patient Instructions (Signed)
 Robotic Laparoscopic Hysterectomy, Care After  The following information offers guidance on how to care for yourself after your procedure. Your health care provider may also give you more specific instructions. If you have problems or questions, contact your health care provider. What can I expect after the procedure? After the procedure, it is common to have: Pain, bruising, and numbness around your incisions. Tiredness (fatigue).  Abdominal bloating Poor appetite. Chest discomfort that radiates to your shoulder from the carbon dioxide gas for a few days after  Vaginal discharge or spotting. You will need to use a sanitary pad after this procedure.  HEAVY BLEEDING LIKE A PERIOD IS NOT NORMAL.  PLEASE CALL YOUR PROVIDER IF SOAKING A PAD or have copious discharge and or pain.  Feelings of sadness or other emotions.  If your ovaries were also removed, it is also common to have symptoms of menopause, such as hot flashes, night sweats, and lack of sleep (insomnia).  Ovaries should stay in if at all possible until at least the age of 62. Follow these instructions at home: Medicines Take over-the-counter and prescription medicines only as told by your health care provider. Ask your health care provider if the medicine prescribed to you: Requires you to avoid driving or using machinery. You cannot drive for 24 hours after anesthesia Can cause constipation. You may need to take these actions to prevent or treat constipation: Drink enough fluid to keep your urine pale yellow. Take over-the-counter or prescription medicines. Eat foods that are high in fiber, such as beans, whole grains, and fresh fruits and vegetables. Limit foods that are high in fat and processed sugars, such as fried or sweet foods.  Also, avoid spicy foods.  NAUSEA IS COMMON THE FIRST NIGHT OF SURGERY.  IF IT LASTS BEYOND 24 HOURS, CALL YOUR PROVIDER.  NAUSEA MEDICATION WAS GIVEN AT YOUR PREOP APPOINTMENT THAT YOU CAN TAKE  AFTER SURGERY. Incision care  Follow instructions from your health care provider about how to take care of your incisions. Make sure you: LEAVE INCISION OPEN AND DRY-NO BANDAGES Leave stitches (sutures), skin glue, or adhesive strips in place UNTIL 2 WEEKS THEN REMOVE IN THE SHOWER.  If adhesive strip edges start to loosen and curl up, you may trim the loose edges. Check your incision areas every day for signs of infection. Check for: More redness, swelling, or pain. Fluid or blood. Warmth. Pus or a bad smell. Activity  Rest as told by your health care provider. Avoid sitting for a long time without moving. Get up to take short walks every 1-2 hours. This is important to improve blood flow and breathing. Ask for help if you feel weak or unsteady.  If you are sore or tired, rest. Return to your normal activities as told by your health care provider. Ask your health care provider what activities are safe for you. Do not lift, push or pull anything that is heavier than 13 lb (4.5 kg), or the limit that you are told, for 6 WEEKS after surgery or until your health care provider says that it is safe. If you were given a sedative during the procedure, it can affect you for several hours. Do not drive or operate machinery until your health care provider says that it is safe. Lifestyle Do not use any products that contain nicotine or tobacco. These products include cigarettes, chewing tobacco, and vaping devices, such as e-cigarettes. These can delay healing after surgery. If you need help quitting, ask your health care provider.  Do not drink alcohol until your health care provider approves. Take a daily multivitamin and keep a high protein diet for wound healing  DO NOT HAVE INTERCOURSE UNTIL YOU ARE INSTRUCTED THAT IT IS SAFE TO DO SO  Post operative appointments need to be scheduled at 2, 6 and 10 weeks.  You can come anytime before these with any concerns.   Discussed and reviewed with patient  risks with early intercourse or use of any foreign objects (externally or internally) can increase your risk including but not limited to the risk of vaginal cuff separation and or infection, risks for bowel involvement, risk for emergent surgery, and hospital admission with need for antibiotics.  Discussed in cases with cuff separation and bowel involvement there may be the need for colostomy placement as well.  In no situation should she have intercourse unless cleared to do so.  This can be anywhere from 10 weeks or longer after surgery.  General instructions YOU MAY TAKE SHOWERS ONLY FOR 2 WEEKS AFTER SURGERY, THEN YOU MAY USE TUBS AND HOT TUBS OR SWIM AFTER THAT Do not douche, use tampons, or have sex for at least 10 weeks, or possibly longer. You will need to have an exam done in the office to be cleared to have intercourse. If you struggle with physical or emotional changes after your procedure, speak with your health care provider or a therapist.  IF YOU HAVE BURNING WITH URINATION, PLEASE CALL YOUR DOCTOR. BLADDER INFECTIONS MAY OCCUR AFTER SURGERY Try to have someone at home with you for the first week to help with your daily chores.  Most patients are driving by the end of the first week after the robotic hysterectomy. Wear compression stockings as told by your health care provider. These stockings help to prevent blood clots and reduce swelling in your legs. Keep all follow-up visits. This is important. Contact a health care provider if: You have any of these signs of infection: Chills or a fever 148f OR GREATER. More redness, swelling, or pain around an incision. Fluid or blood coming from an incision. Warmth coming from an incision. Pus or a bad smell coming from an incision. Burning with urination. Urinary frequency or cramping.   IF YOU HAVE THESE SYMPTOMS, PLEASE CALL THE OFFICE TO COME EVALUATE FOR A BLADDER INFECTION AT [phone] An incision opens. You feel dizzy or  light-headed. You have pain or bleeding when you urinate, or you are unable to urinate. You have abnormal vaginal discharge. You have pain that does not get better with medicine. Get help right away if: You have a fever and your symptoms suddenly get worse. You have severe abdominal pain. Heavy vaginal bleeding, like a period You have chest pain or shortness of breath. You may have chest pain and shortness of breath from the CO2 gas for a few days after surgery.  This is very common.  Walking, Gas-X and motrin  will usually help relieve this discomfort You faint. You have pain, swelling, or redness in your leg.  These symptoms may represent a serious problem that is an emergency. Do not wait to see if the symptoms will go away. Get medical help right away. Call your local emergency services (911 in the [place] Do not drive yourself to the hospital. Summary  CONSTIPATION MEDICATION AFTER SURGERY: COLACE, MOM, MIRALAX , GAS X are all helpful to have on hand, if needed.  FILL ALL POSTOP MEDICATION BEFORE SURGERY   [email or web address] is a nice blog site for women preparing for the  robotic hysterectomy

## 2024-10-17 NOTE — Addendum Note (Signed)
 Addended by: GLENNON [name] on: 10/17/2024 01:48 PM   Modules accepted: Orders

## 2024-10-17 NOTE — Progress Notes (Signed)
 Acute Office Visit  Subjective:    Patient ID: Joanne Wallace, female    DOB: July 02, 1996, 28 y.o.   MRN: [id number]  PREOP for RLH, bilateral salpingectomy, cystoscopy  HPI 28 y.o. presents today for Pre-op Exam (Pre-op/Pap-01/16/23/11/11/24- Total Hysterectomy ) .  Patient's last menstrual period was 10/04/2024 (exact date). Period Duration (Days): 6 Menstrual Flow: Heavy Menstrual Control: Other (Comment) (period underwear) Dysmenorrhea: (!) Severe (Mild to severe) Dysmenorrhea Symptoms: Cramping, Nausea, Diarrhea, Headache  Patient is a surgical consult from Jami C. Patient with severe debilitating periods when she has them. Has heavy periods and can bleed twice a month or longer. She has tried ocp's in the past with no resolution or improvement of her symptoms. Has dyspareunia and her and her partner would not like to have any children. Her family has been aware of this as well for a long while that she did not want kids and has medical issues and does not want to have complications in an undesired pregnancy. Periods and the pain have affected her quality of life. Patient counseled that the hysterectomy is permanent and she will no longer be able to conceive, and she voiced understanding. Patient also with vagal responses with any cervical exam or during intercourse. She avoids intercourse because of the discomfort. GYN exams are extremely difficult as well and she feels removing the cervix with the hysterectomy will be beneficial. Patient counseled her pain could still continue, even with a hysterectomy and she is willing to take the risk.  Review of Systems     Objective:    Physical Exam Constitutional:      Appearance: Normal appearance.  Musculoskeletal:     Cervical back: Normal range of motion.  Neurological:     Mental Status: She is alert and oriented to person, place, and time.  Psychiatric:        Mood and Affect: Mood normal.        Behavior: Behavior  normal.  Vitals and nursing note reviewed.     BP 130/80   Pulse (!) 101   Ht 5' 5.16 (1.655 m)   Wt 190 lb (86.2 kg)   LMP 10/04/2024 (Exact Date)   SpO2 97%   BMI 31.47 kg/m  Wt Readings from Last 3 Encounters:  10/17/24 190 lb (86.2 kg)  08/03/24 188 lb 6.4 oz (85.5 kg)  07/21/24 190 lb (86.2 kg)       OB History     Gravida  0   Para  0   Term  0   Preterm  0   AB  0   Living  0      SAB  0   IAB  0   Ectopic  0   Multiple  0   Live Births  0          Past Surgical History:  Procedure Laterality Date   HERNIA REPAIR     double   TONSILLECTOMY AND ADENOIDECTOMY     Past Medical History:  Diagnosis Date   Anxiety 2014   Depression 2014   IBS (irritable bowel syndrome)    Mixed connective tissue disease    Neuromuscular disorder (HCC) 2021   mixed connective tissue disease   Social History   Socioeconomic History   Marital status: Single    Spouse name: Not on file   Number of children: Not on file   Years of education: Not on file   Highest education level: Associate degree: occupational, scientist, product/process development, or  vocational program  Occupational History   Not on file  Tobacco Use   Smoking status: Former    Current packs/day: 0.00    Average packs/day: 0.3 packs/day for 7.0 years (1.8 ttl pk-yrs)    Types: Cigarettes    Start date: 2012    Quit date: 2019    Years since quitting: 6.8   Smokeless tobacco: Never   Tobacco comments:    Only tough when I am around smokers, but I've never had a craving I couldnt handle.  Vaping Use   Vaping status: Former  Substance and Sexual Activity   Alcohol use: Yes    Alcohol/week: 6.0 - 12.0 standard drinks of alcohol    Types: 6 - 12 Standard drinks or equivalent per week    Comment: 1 drink daily   Drug use: Yes    Frequency: 7.0 times per week    Types: Marijuana   Sexual activity: Yes    Partners: Male    Birth control/protection: Condom    Comment: menarche 28yo, sexual debut 28yo   Other Topics Concern   Not on file  Social History Narrative   Not on file   Social Drivers of Health   Financial Resource Strain: Medium Risk (01/14/2024)   Overall Financial Resource Strain (CARDIA)    Difficulty of Paying Living Expenses: Somewhat hard  Food Insecurity: Food Insecurity Present (01/14/2024)   Hunger Vital Sign    Worried About Running Out of Food in the Last Year: Sometimes true    Ran Out of Food in the Last Year: Sometimes true  Transportation Needs: Unmet Transportation Needs (01/14/2024)   PRAPARE - Transportation    Lack of Transportation (Medical): Yes    Lack of Transportation (Non-Medical): Yes  Physical Activity: Insufficiently Active (01/14/2024)   Exercise Vital Sign    Days of Exercise per Week: 3 days    Minutes of Exercise per Session: 30 min  Stress: Stress Concern Present (01/14/2024)   [employer] of Occupational [employer] - Occupational Stress Questionnaire    Feeling of Stress : Rather much  Social Connections: Moderately Isolated (01/14/2024)   Social Connection and Isolation Panel    Frequency of Communication with Friends and Family: Twice a week    Frequency of Social Gatherings with Friends and Family: Twice a week    Attends Religious Services: Never    Diplomatic Services Operational Officer: No    Attends Engineer, Structural: Not on file    Marital Status: Living with partner  Intimate Partner Violence: Not on file   Family History  Problem Relation Age of Onset   Breast cancer Mother    Hypothyroidism Mother    Depression Mother    Obesity Mother    Hypothyroidism Father    Obesity Father    Gout Father    Healthy Brother    Rheum arthritis Paternal Aunt    Osteoarthritis Paternal Aunt    Arthritis Maternal Grandmother    Diabetes Paternal Grandmother    Obesity Paternal Grandfather    Colon cancer Neg Hx    Rectal cancer Neg Hx    Current Outpatient Medications on File Prior to Visit  Medication Sig  Dispense Refill   Cyanocobalamin  (B-12 PO) Take by mouth.     DULoxetine  (CYMBALTA ) 20 MG capsule Take 2 capsules (40 mg total) by mouth daily. 180 capsule 3   hydroxychloroquine  (PLAQUENIL ) 200 MG tablet TAKE 1 TABLET BY MOUTH TWICE DAILY MONDAY THROUGH FRIDAY ONLY 120 tablet  0   hydrOXYzine (ATARAX) 10 MG tablet Take 1 tablet (10 mg total) by mouth 3 (three) times daily as needed. 30 tablet 0   methocarbamol  (ROBAXIN ) 500 MG tablet TAKE 1 TABLET BY MOUTH EVERY DAY AS NEEDED 30 tablet 0   Multiple Vitamin (MULTIVITAMIN WITH MINERALS) TABS tablet Take 1 tablet by mouth daily.     Probiotic Product (PROBIOTIC PO) Take by mouth. Probiotic/prebiotic     thiamine  (VITAMIN B1) 100 MG tablet Take 1 tablet (100 mg total) by mouth daily. 90 tablet 3   Cyanocobalamin  (VITAMIN B 12 PO) Take by mouth daily. (Patient not taking: Reported on 10/17/2024)     dicyclomine  (BENTYL ) 10 MG capsule Take 1 capsule (10 mg total) by mouth 4 (four) times daily -  before meals and at bedtime. (Patient not taking: Reported on 10/17/2024) 90 capsule 2   estradiol  (ESTRACE  VAGINAL) 0.1 MG/GM vaginal cream Place 1 g vaginally 3 (three) times a week. As well as a pea sized amount to upper vulva and clitoris (Patient not taking: Reported on 10/17/2024) 127.5 g 3   metroNIDAZOLE  (FLAGYL ) 500 MG tablet Take 1 tablet (500 mg total) by mouth 2 (two) times daily. (Patient not taking: Reported on 10/17/2024) 14 tablet 0   oxybutynin  (DITROPAN ) 5 MG tablet Take 1 tablet (5 mg total) by mouth every 8 (eight) hours as needed for bladder spasms. (Patient not taking: Reported on 10/17/2024) 30 tablet 0   rOPINIRole  (REQUIP ) 0.5 MG tablet TAKE 1 TABLET BY MOUTH AT BEDTIME. (Patient not taking: Reported on 10/17/2024) 30 tablet 1   No current facility-administered medications on file prior to visit.   .  Assessment & Plan:  PREOP H&P for RLH, bilateral salpingectomy, cystoscopy Dysmenorrhea DUB Cervical pain Dyspareunia Difficulty  with pelvic exams Desires definitive management with RLH Likely endometriosis  Counseled on all options.  She would like to have the [facility].  Counseled extensively on the procedure including but not limited to what to expect and risks and benefits.  Counseled on postop care and pelvic rest for 10 weeks after the surgery with restricted lifting for 6 weeks after.  Counseled on the benefits of the robotic procedure with faster return to daily activities, improved outcomes, and less risk for complications.  Discussed risk of still having pain in the future and risk for additional surgeries in the future, if endometriosis is found. Discussed removal of as much lesions as possible, if in a safe areas.  She again voiced that she understands the hysterectomy is permanent and she cannot conceive in the future and wanted to proceed with the procedure.   The risks of surgery were discussed in detail with the patient including but not limited to: bleeding which may require transfusion or reoperation; infection which may require prolonged hospitalization or re-hospitalization and antibiotic therapy; injury to bowel, bladder, ureters and major vessels or other surrounding organs which may lead to other procedures; formation of adhesions; need for additional procedures including laparotomy or subsequent procedures secondary to intraoperative injury or abnormal pathology; thromboembolic phenomenon; incisional problems and other postoperative or anesthesia complications.  The postoperative expectations were also discussed in detail. The patient also understands the alternative treatment options which were discussed in full. All questions were answered.  Patient would like to proceed with the procedure.  Dr. Glennon   30 minutes spent on reviewing records, imaging,  and one on one patient time and counseling patient and documentation   Joanne Wallace

## 2024-11-02 ENCOUNTER — Encounter: Payer: Self-pay | Admitting: *Deleted

## 2024-11-04 ENCOUNTER — Encounter (HOSPITAL_COMMUNITY): Payer: Self-pay | Admitting: Obstetrics and Gynecology

## 2024-11-04 NOTE — Progress Notes (Signed)
 Surgical Instructions  Your procedure is scheduled on :  Friday December 12th, 2025 Report to [facility] Main Entrance A at 12:30 PM, then check in the Admitting office. Any questions or running late day of surgery :  call [phone]  Questions prior to your surgery day:  call [phone], Monday -- Friday 8am - 4pm. If you experience any cold or flu symptoms such as cough, fever, chills, shortness of breath, etc. between now and you scheduled surgery, please notify your surgeon office.   Remember: Do Not eat any food after midnight the night before surgery. You may have clear liquids from midnight night before surgery until 12:30 PM.   Clear liquids allowed are:  Water             Carbonated Beverages (diabetics choose diet or no sugar options)  Clear Tea ( no milk, no honey, etc.)  Black coffee ( NO MILK, CREAM OR POWDERED CREAMER OF ANY KIND)  Sport drinks, like Gatorade (diabetes choose diet or no sugar options)  NO clear liquid after 1130 AM day of surgery.  This includes No water,  candy,  gum, and mints.  Take these medicines the morning of surgery with A SIPS OF WATER:  NONE   May take these medicines IF NEEDED:     One week prior to surgery, STOP taking any Aspirin (unless otherwise instructed by your surgeon) Aleve , Naproxen , ibuprofen , Motrin , Advil , Goody's, BC's, all herbal medications/ supplements, fish oil, and non-prescription vitamins.  Do NOT Smoke (tobacco/ vaping) and Do Not drink alcohol for 24 hours prior to your procedure.  For those patients that use a CPAP.  Please bring your CPAP/ mask/ tubing with them day of surgery . Anesthesia may ask recovery room nurse to use and if you stay the night you be asked to use it.  You will be asked to removed any contacts, glasses, piercing's, hearing aid's, dentures/ partials prior to surgery.  Please bring cases/ container/ solution/ etc., for them day of surgery.   Patients discharged the day of surgery will NOT be  allowed to drive home.  You must have responsible driver and caregiver to stay at home with you the next 24 hours.  SURGICAL WAITING ROOM VISITATION Patients may have no more than 2 support people in the waiting area - if more than 2 , these visitors may rotate.  Pre-op nurse will coordinate an appropriate time for 1 Adult support person, who may not rotate, to accompany patient in pre-op.  Aware some patients may have certain circumstances, speak to pre-op nurse day of surgery.  Children under the age 31 must have an adult with them who is not the patient and must remain in the main waiting area with an adult.  If the patient needs to stay at the hospital during part of their recovery, the visitor guidelines for inpatient rooms apply.  Please refer to the [facility] website for the visitor guidelines for any additional information.  If you received a COVID test during your pre-op visit it is requested that you wear a mask when out in public, stay away from anyone that may not be feeling well and notify your surgeon if you develop symptoms.  If you have been in contact with anyone that has tested positive in the past 10 days notify your surgeon.     [facility] - Preparing for Surgery  Before surgery, you can play an important role. Because skin is not sterile, it needs to be as  free of germs as possible. You can reduce the number of germs on your skin by washing with CHG (chlorhexidine gluconate) soap before surgery. CHG is an antiseptic cleaner which kills germs and bonds with the skin to continue killing germs even after washing. Oral hygiene is also important in reducing the risk of infection. Remember to brush your teeth with your regular toothpaste the morning of surgery.  Please DO NOT use if you have an allergy to CHG or antibacterial soaps. If your skin becomes reddened/irritated stop using the CHG and inform your Pre-op nurse day of surgery.  DO NOT shave (including legs and genital  area) for at least 48 hours prior to your CHG shower.   Please follow these instructions carefully:  Shower with CHG soap the night before surgery. If you choose to wash your hair, wash your hair first as usual with your normal shampoo. After you shampoo, rinse your hair and body thoroughly to remove the shampoo. Use CHG as you would any other liquid soap. You can apply CHG directly to the skin and wash gently with a clean washcloth or shower sponge. Apply the CHG soap to your body ONLY FROM THE NECK DOWN. Do not use on open wounds or open sores. Avoid contact with your eyes, ears, mouth, and genitals (private parts). Wash genitals (private parts) with your normal soap. Wash thoroughly, paying special attention to the area where your surgery will be performed. Thoroughly rinse your body with warm water from the neck down. DO NOT shower/wash with your normal soap after using and rinsing off the CHG soap. DO NOT use lotions, oils, etc., after showering with CHG. Pat yourself dry with a clean towel. Wear clean pajamas. Place clean sheets on your bed the night of your CHG shower and do not sleep with pets.  Day of Surgery  DO NOT Apply any lotions,  powder,  oils,  deodorants (may use underarm deodorant),  cologne/  perfumes  or makeup Do Not wear jewelry /  piercing's/  metal/  permanent jewelry must be removed prior to arrival day of surgery. (No plastic piercing) Do Not wear nail polish,  gel polish,  artificial nails, or any other type of covering on natural finger nails (toe nails are okay) Remember to brush your teeth and rinse mouth out. Put on clean / comfortable clothes. [facility] is not responsible for valuables/ personal belongings

## 2024-11-04 NOTE — Progress Notes (Signed)
 Spoke w/ via phone for pre-op interview--- [facility] Lab needs dos----  CBC, T&S per surgeon, lab appt 11/09/24 at 1 pm. UPT day if surgery.       Lab results------ COVID test -----patient states asymptomatic no test needed Arrive at -------1230 NPO after MN NO Solid Food.  Clear liquids from MN until---1130 Pre-Surgery Ensure or G2:  Med rec completed Medications to take morning of surgery -----NONE Diabetic medication -----  GLP1 agonist last dose: GLP1 instructions:  Patient instructed no nail polish to be worn day of surgery Patient instructed to bring photo id and insurance card day of surgery Patient aware to have Driver (ride ) / caregiver    for 24 hours after surgery - Partner Aureliano Gaskins Patient Special Instructions ----- Shower with CHG night before surgery. Pre-Op special Instructions -----  Patient verbalized understanding of instructions that were given at this phone interview. Patient denies chest pain, sob, fever, cough at the interview.

## 2024-11-07 NOTE — Telephone Encounter (Signed)
 Recommend updating x-rays of of both knees prior to applying for visco-supplementation. She is scheduled for an OV on 01/16/25-we can update x-rays at that time.

## 2024-11-07 NOTE — Telephone Encounter (Signed)
 Patient advised Recommend updating x-rays of of both knees prior to applying for visco-supplementation. She is scheduled for an OV on 01/16/25-we can update x-rays at that time. Patient verbalized understanding.

## 2024-11-09 ENCOUNTER — Ambulatory Visit: Payer: Self-pay | Admitting: Obstetrics and Gynecology

## 2024-11-09 ENCOUNTER — Encounter (HOSPITAL_COMMUNITY)
Admission: RE | Admit: 2024-11-09 | Discharge: 2024-11-09 | Disposition: A | Discharge status: Home / Self Care | Source: Ambulatory Visit | Attending: Obstetrics and Gynecology | Admitting: Obstetrics and Gynecology

## 2024-11-09 DIAGNOSIS — Z01812 Encounter for preprocedural laboratory examination: Secondary | ICD-10-CM | POA: Insufficient documentation

## 2024-11-09 DIAGNOSIS — Z01818 Encounter for other preprocedural examination: Secondary | ICD-10-CM

## 2024-11-09 LAB — CBC
HCT: 40.7 % (ref 36.0–46.0)
Hemoglobin: 14.4 g/dL (ref 12.0–15.0)
MCH: 32.6 pg (ref 26.0–34.0)
MCHC: 35.4 g/dL (ref 30.0–36.0)
MCV: 92.1 fL (ref 80.0–100.0)
Platelets: 268 K/uL (ref 150–400)
RBC: 4.42 MIL/uL (ref 3.87–5.11)
RDW: 12.1 % (ref 11.5–15.5)
WBC: 7.7 K/uL (ref 4.0–10.5)
nRBC: 0 % (ref 0.0–0.2)

## 2024-11-09 LAB — TYPE AND SCREEN
ABO/RH(D): O POS
Antibody Screen: NEGATIVE

## 2024-11-10 ENCOUNTER — Encounter: Payer: Self-pay | Admitting: Obstetrics and Gynecology

## 2024-11-10 MED ORDER — SODIUM CHLORIDE 0.9 % IV SOLN
Freq: Once | INTRAVENOUS | Status: DC
  Filled 2024-11-10: qty 10

## 2024-11-11 ENCOUNTER — Ambulatory Visit (HOSPITAL_COMMUNITY)
Admission: RE | Admit: 2024-11-11 | Discharge: 2024-11-11 | Disposition: A | Attending: Obstetrics and Gynecology | Admitting: Obstetrics and Gynecology

## 2024-11-11 ENCOUNTER — Ambulatory Visit (HOSPITAL_COMMUNITY): Admitting: Anesthesiology

## 2024-11-11 ENCOUNTER — Encounter (HOSPITAL_COMMUNITY): Admission: RE | Disposition: A | Payer: Self-pay | Source: Home / Self Care | Attending: Obstetrics and Gynecology

## 2024-11-11 ENCOUNTER — Other Ambulatory Visit: Payer: Self-pay

## 2024-11-11 ENCOUNTER — Encounter (HOSPITAL_COMMUNITY): Payer: Self-pay | Admitting: Obstetrics and Gynecology

## 2024-11-11 DIAGNOSIS — Z87891 Personal history of nicotine dependence: Secondary | ICD-10-CM | POA: Insufficient documentation

## 2024-11-11 DIAGNOSIS — N946 Dysmenorrhea, unspecified: Secondary | ICD-10-CM | POA: Insufficient documentation

## 2024-11-11 DIAGNOSIS — Z6831 Body mass index (BMI) 31.0-31.9, adult: Secondary | ICD-10-CM | POA: Insufficient documentation

## 2024-11-11 DIAGNOSIS — D259 Leiomyoma of uterus, unspecified: Secondary | ICD-10-CM | POA: Diagnosis not present

## 2024-11-11 DIAGNOSIS — N879 Dysplasia of cervix uteri, unspecified: Secondary | ICD-10-CM | POA: Insufficient documentation

## 2024-11-11 DIAGNOSIS — Z01818 Encounter for other preprocedural examination: Secondary | ICD-10-CM

## 2024-11-11 DIAGNOSIS — E669 Obesity, unspecified: Secondary | ICD-10-CM | POA: Insufficient documentation

## 2024-11-11 DIAGNOSIS — N938 Other specified abnormal uterine and vaginal bleeding: Secondary | ICD-10-CM | POA: Diagnosis present

## 2024-11-11 DIAGNOSIS — N921 Excessive and frequent menstruation with irregular cycle: Secondary | ICD-10-CM | POA: Diagnosis not present

## 2024-11-11 HISTORY — PX: HYSTERECTOMY, TOTAL, LAPAROSCOPIC, ROBOT-ASSISTED WITH SALPINGECTOMY: SHX7587

## 2024-11-11 HISTORY — PX: EXCISION, ENDOMETRIOSIS, ROBOTIC ASSISTED, LAPAROSCOPIC: SHX7564

## 2024-11-11 HISTORY — PX: CYSTOSCOPY: SHX5120

## 2024-11-11 LAB — ABO/RH: ABO/RH(D): O POS

## 2024-11-11 LAB — POCT PREGNANCY, URINE: Preg Test, Ur: NEGATIVE

## 2024-11-11 SURGERY — HYSTERECTOMY, TOTAL, LAPAROSCOPIC, ROBOT-ASSISTED WITH SALPINGECTOMY
Anesthesia: General | Site: Pelvis

## 2024-11-11 MED ORDER — FENTANYL CITRATE (PF) 250 MCG/5ML IJ SOLN
INTRAMUSCULAR | Status: AC
  Filled 2024-11-11: qty 5

## 2024-11-11 MED ORDER — SCOPOLAMINE 1 MG/3DAYS TD PT72
MEDICATED_PATCH | TRANSDERMAL | Status: AC
  Filled 2024-11-11: qty 1

## 2024-11-11 MED ORDER — ONDANSETRON HCL 4 MG/2ML IJ SOLN: 4.0000 mg | Freq: Once | INTRAMUSCULAR | Status: DC | PRN

## 2024-11-11 MED ORDER — SODIUM CHLORIDE 0.9 % IV SOLN
INTRAVENOUS | Status: AC
  Filled 2024-11-11: qty 2

## 2024-11-11 MED ORDER — LACTATED RINGERS IV SOLN: INTRAVENOUS | Status: DC

## 2024-11-11 MED ORDER — SUGAMMADEX SODIUM 200 MG/2ML IV SOLN
INTRAVENOUS | Status: DC | PRN
  Administered 2024-11-11: 200 mg via INTRAVENOUS

## 2024-11-11 MED ORDER — CHLORHEXIDINE GLUCONATE 0.12 % MT SOLN
15.0000 mL | Freq: Once | OROMUCOSAL | Status: AC
  Administered 2024-11-11: 15 mL via OROMUCOSAL

## 2024-11-11 MED ORDER — AMISULPRIDE (ANTIEMETIC) 5 MG/2ML IV SOLN: 10.0000 mg | Freq: Once | INTRAVENOUS | Status: DC | PRN

## 2024-11-11 MED ORDER — ACETAMINOPHEN 500 MG PO TABS
ORAL_TABLET | ORAL | Status: AC
  Filled 2024-11-11: qty 2

## 2024-11-11 MED ORDER — ONDANSETRON HCL 4 MG/2ML IJ SOLN
INTRAMUSCULAR | Status: AC
  Filled 2024-11-11: qty 2

## 2024-11-11 MED ORDER — LIDOCAINE 2% (20 MG/ML) 5 ML SYRINGE
INTRAMUSCULAR | Status: AC
  Filled 2024-11-11: qty 5

## 2024-11-11 MED ORDER — FENTANYL CITRATE (PF) 250 MCG/5ML IJ SOLN
INTRAMUSCULAR | Status: DC | PRN
  Administered 2024-11-11: 150 ug via INTRAVENOUS
  Administered 2024-11-11 (×2): 50 ug via INTRAVENOUS

## 2024-11-11 MED ORDER — DEXMEDETOMIDINE HCL IN NACL 80 MCG/20ML IV SOLN
INTRAVENOUS | Status: DC | PRN
  Administered 2024-11-11 (×4): 10 ug via INTRAVENOUS

## 2024-11-11 MED ORDER — SCOPOLAMINE 1 MG/3DAYS TD PT72
1.0000 | MEDICATED_PATCH | TRANSDERMAL | Status: DC
  Administered 2024-11-11: 1 mg via TRANSDERMAL

## 2024-11-11 MED ORDER — DEXMEDETOMIDINE HCL IN NACL 80 MCG/20ML IV SOLN
INTRAVENOUS | Status: AC
  Filled 2024-11-11: qty 20

## 2024-11-11 MED ORDER — PROPOFOL 10 MG/ML IV BOLUS
INTRAVENOUS | Status: AC
  Filled 2024-11-11: qty 20

## 2024-11-11 MED ORDER — PROPOFOL 10 MG/ML IV BOLUS
INTRAVENOUS | Status: DC | PRN
  Administered 2024-11-11: 150 mg via INTRAVENOUS

## 2024-11-11 MED ORDER — PHENYLEPHRINE 80 MCG/ML (10ML) SYRINGE FOR IV PUSH (FOR BLOOD PRESSURE SUPPORT)
PREFILLED_SYRINGE | INTRAVENOUS | Status: DC | PRN
  Administered 2024-11-11: 80 ug via INTRAVENOUS

## 2024-11-11 MED ORDER — KETAMINE HCL 50 MG/5ML IJ SOSY
PREFILLED_SYRINGE | INTRAMUSCULAR | Status: AC
  Filled 2024-11-11: qty 5

## 2024-11-11 MED ORDER — METRONIDAZOLE 500 MG/100ML IV SOLN
INTRAVENOUS | Status: AC
  Filled 2024-11-11: qty 100

## 2024-11-11 MED ORDER — 0.9 % SODIUM CHLORIDE (POUR BTL) OPTIME
TOPICAL | Status: DC | PRN
  Administered 2024-11-11: 1000 mL

## 2024-11-11 MED ORDER — PROPOFOL 1000 MG/100ML IV EMUL
INTRAVENOUS | Status: AC
  Filled 2024-11-11: qty 200

## 2024-11-11 MED ORDER — DEXAMETHASONE SOD PHOSPHATE PF 10 MG/ML IJ SOLN
INTRAMUSCULAR | Status: DC | PRN
  Administered 2024-11-11: 10 mg via INTRAVENOUS

## 2024-11-11 MED ORDER — METRONIDAZOLE 500 MG/100ML IV SOLN
500.0000 mg | Freq: Once | INTRAVENOUS | Status: AC
  Administered 2024-11-11: 500 mg via INTRAVENOUS
  Filled 2024-11-11: qty 100

## 2024-11-11 MED ORDER — ACETAMINOPHEN 500 MG PO TABS
1000.0000 mg | ORAL_TABLET | ORAL | Status: AC
  Administered 2024-11-11: 1000 mg via ORAL

## 2024-11-11 MED ORDER — LIDOCAINE 2% (20 MG/ML) 5 ML SYRINGE
INTRAMUSCULAR | Status: DC | PRN
  Administered 2024-11-11: 80 mg via INTRAVENOUS

## 2024-11-11 MED ORDER — FENTANYL CITRATE (PF) 100 MCG/2ML IJ SOLN
INTRAMUSCULAR | Status: AC
  Filled 2024-11-11: qty 2

## 2024-11-11 MED ORDER — MIDAZOLAM HCL (PF) 2 MG/2ML IJ SOLN
INTRAMUSCULAR | Status: DC | PRN
  Administered 2024-11-11: 2 mg via INTRAVENOUS

## 2024-11-11 MED ORDER — ORAL CARE MOUTH RINSE: 15.0000 mL | Freq: Once | OROMUCOSAL | Status: AC

## 2024-11-11 MED ORDER — ONDANSETRON HCL 4 MG/2ML IJ SOLN
INTRAMUSCULAR | Status: DC | PRN
  Administered 2024-11-11: 4 mg via INTRAVENOUS

## 2024-11-11 MED ORDER — MIDAZOLAM HCL 2 MG/2ML IJ SOLN
INTRAMUSCULAR | Status: AC
  Filled 2024-11-11: qty 2

## 2024-11-11 MED ORDER — ROCURONIUM BROMIDE 10 MG/ML (PF) SYRINGE
PREFILLED_SYRINGE | INTRAVENOUS | Status: AC
  Filled 2024-11-11: qty 10

## 2024-11-11 MED ORDER — FENTANYL CITRATE (PF) 100 MCG/2ML IJ SOLN
25.0000 ug | INTRAMUSCULAR | Status: DC | PRN
  Administered 2024-11-11 (×2): 50 ug via INTRAVENOUS

## 2024-11-11 MED ORDER — POVIDONE-IODINE 10 % EX SWAB
2.0000 | Freq: Once | CUTANEOUS | Status: AC
  Administered 2024-11-11: 2 via TOPICAL

## 2024-11-11 MED ORDER — CHLORHEXIDINE GLUCONATE 0.12 % MT SOLN
OROMUCOSAL | Status: AC
  Filled 2024-11-11: qty 15

## 2024-11-11 MED ORDER — SODIUM CHLORIDE 0.9 % IV SOLN
INTRAVENOUS | Status: DC | PRN
  Administered 2024-11-11: 1000 mL

## 2024-11-11 MED ORDER — KETAMINE HCL 50 MG/5ML IJ SOSY
PREFILLED_SYRINGE | INTRAMUSCULAR | Status: DC | PRN
  Administered 2024-11-11: 40 mg via INTRAVENOUS
  Administered 2024-11-11: 10 mg via INTRAVENOUS

## 2024-11-11 MED ORDER — BUPIVACAINE HCL (PF) 0.5 % IJ SOLN
INTRAMUSCULAR | Status: DC | PRN
  Administered 2024-11-11: 17 mL

## 2024-11-11 MED ORDER — ROCURONIUM BROMIDE 10 MG/ML (PF) SYRINGE
PREFILLED_SYRINGE | INTRAVENOUS | Status: DC | PRN
  Administered 2024-11-11: 60 mg via INTRAVENOUS
  Administered 2024-11-11: 20 mg via INTRAVENOUS

## 2024-11-11 MED ORDER — SODIUM CHLORIDE 0.9 % IV SOLN
2.0000 g | INTRAVENOUS | Status: AC
  Administered 2024-11-11: 2 g via INTRAVENOUS
  Filled 2024-11-11: qty 2

## 2024-11-11 MED FILL — henylephrine-NaCl Pref Syr 0.8 MG/10ML-0.9% (80 MCG/ML): INTRAVENOUS | Qty: 10 | Status: AC

## 2024-11-11 MED FILL — Dexmedetomidine HCl in NaCl 0.9% IV Soln 80 MCG/20ML: INTRAVENOUS | Qty: 20 | Status: AC

## 2024-11-11 SURGICAL SUPPLY — 49 items
BARRIER ADHS 3X4 INTERCEED (GAUZE/BANDAGES/DRESSINGS) IMPLANT
COVER BACK TABLE 60X90IN (DRAPES) ×3 IMPLANT
COVER TIP SHEARS 8 DVNC (MISCELLANEOUS) ×3 IMPLANT
DEFOGGER SCOPE WARM SEASHARP (MISCELLANEOUS) ×3 IMPLANT
DERMABOND ADVANCED .7 DNX12 (GAUZE/BANDAGES/DRESSINGS) ×3 IMPLANT
DRAPE ARM DVNC X/XI (DISPOSABLE) ×12 IMPLANT
DRAPE COLUMN DVNC XI (DISPOSABLE) ×3 IMPLANT
DRAPE SURG IRRIG POUCH 19X23 (DRAPES) ×3 IMPLANT
DRAPE UTILITY XL STRL (DRAPES) ×3 IMPLANT
DRIVER NDL MEGA SUTCUT DVNCXI (INSTRUMENTS) ×3 IMPLANT
DURAPREP 26ML APPLICATOR (WOUND CARE) ×3 IMPLANT
ELECTRODE REM PT RTRN 9FT ADLT (ELECTROSURGICAL) ×3 IMPLANT
FORCEPS PROGRASP DVNC XI (FORCEP) ×3 IMPLANT
GAUZE 4X4 16PLY ~~LOC~~+RFID DBL (SPONGE) IMPLANT
GLOVE BIOGEL PI IND STRL 7.0 (GLOVE) ×6 IMPLANT
GLOVE NEODERM STER SZ 7 (GLOVE) ×9 IMPLANT
GOWN STRL REUS W/ TWL LRG LVL3 (GOWN DISPOSABLE) ×3 IMPLANT
HOLDER FOLEY CATH W/STRAP (MISCELLANEOUS) IMPLANT
IRRIGATION SUCT STRKRFLW 2 WTP (MISCELLANEOUS) ×3 IMPLANT
KIT PINK PAD W/HEAD ARM REST (MISCELLANEOUS) ×3 IMPLANT
KIT TURNOVER KIT B (KITS) ×3 IMPLANT
LEGGING LITHOTOMY PAIR STRL (DRAPES) ×3 IMPLANT
MANIFOLD NEPTUNE II (INSTRUMENTS) ×3 IMPLANT
OBTURATOR OPTICALSTD 8 DVNC (TROCAR) ×3 IMPLANT
OCCLUDER COLPOPNEUMO (BALLOONS) IMPLANT
PACK CYSTO (CUSTOM PROCEDURE TRAY) ×3 IMPLANT
PACK ROBOT WH (CUSTOM PROCEDURE TRAY) ×3 IMPLANT
PACK ROBOTIC GOWN (GOWN DISPOSABLE) ×3 IMPLANT
PAD OB MATERNITY 11 LF (PERSONAL CARE ITEMS) ×3 IMPLANT
POWDER SURGICEL 3.0 GRAM (HEMOSTASIS) IMPLANT
RUMI II 3.0CM BLUE KOH-EFFICIE (DISPOSABLE) IMPLANT
RUMI II GYRUS 2.5CM BLUE (DISPOSABLE) IMPLANT
RUMI II GYRUS 3.5CM BLUE (DISPOSABLE) IMPLANT
RUMI II GYRUS 4.0CM BLUE (DISPOSABLE) IMPLANT
SCISSORS MNPLR CVD DVNC XI (INSTRUMENTS) ×3 IMPLANT
SEAL UNIV 5-12 XI (MISCELLANEOUS) ×9 IMPLANT
SEALER VESSEL EXT DVNC XI (MISCELLANEOUS) IMPLANT
SET CYSTO IRRIGATION (SET/KITS/TRAYS/PACK) ×3 IMPLANT
SET TUBE SMOKE EVAC HIGH FLOW (TUBING) ×3 IMPLANT
SOLN 0.9% NACL POUR BTL 1000ML (IV SOLUTION) ×3 IMPLANT
SPIKE FLUID TRANSFER (MISCELLANEOUS) ×3 IMPLANT
SUT MNCRL AB 4-0 PS2 18 (SUTURE) ×3 IMPLANT
SUT VLOC 180 0 9IN GS21 (SUTURE) ×6 IMPLANT
TIP ENDOSCOPIC SURGICEL (TIP) IMPLANT
TIP UTERINE 6.7X10CM GRN DISP (MISCELLANEOUS) IMPLANT
TIP UTERINE 6.7X8CM BLUE DISP (MISCELLANEOUS) IMPLANT
TOWEL GREEN STERILE (TOWEL DISPOSABLE) ×3 IMPLANT
TRAY FOLEY W/BAG SLVR 14FR LF (SET/KITS/TRAYS/PACK) IMPLANT
UNDERPAD 30X36 HEAVY ABSORB (UNDERPADS AND DIAPERS) ×3 IMPLANT

## 2024-11-11 NOTE — Interval H&P Note (Signed)
 History and Physical Interval Note:  11/11/2024 12:54 PM  Joanne Wallace  has presented today for surgery, with the diagnosis of dysmenorrhea, menorrhagia with irregular cycle, DUB.  The various methods of treatment have been discussed with the patient and family. After consideration of risks, benefits and other options for treatment, the patient has consented to  Procedures: HYSTERECTOMY, TOTAL, LAPAROSCOPIC, ROBOT-ASSISTED WITH SALPINGECTOMY (Bilateral) CYSTOSCOPY (N/A) as a surgical intervention.  The patient's history has been reviewed, patient examined, no change in status, stable for surgery.  I have reviewed the patient's chart and labs.  Questions were answered to the patient's satisfaction.     [name]

## 2024-11-11 NOTE — H&P (Signed)
 10/17/2024 [facility], [name], MD Obstetrics and Gynecology Preop examination Dx Pre-op Exam ; Referred by Rollene Almarie LABOR, MD Reason for Visit   Additional Documentation  Vitals: BP 130/80   Pulse 101 Important    Ht 5' 5.16 (1.655 m)   Wt 86.2 kg   LMP 10/04/2024 (Exact Date)   SpO2 97%   BMI 31.47 kg/m   BSA 1.99 m      More Vitals  Flowsheets: Anthropometrics,   Menstrual History,   NEWS,   MEWS Score,   Vital Signs  Encounter Info: Billing Info,   History,   Allergies,   Detailed Report   All Notes   Addendum Note by Glennon [name], MD at 10/17/2024 12:00 PM  Author: Glennon [name], MD Author Type: Physician Filed: 10/17/2024  1:48 PM  Note Status: Signed Cosign: Cosign Not Required Encounter Date: 10/17/2024  Editor: Glennon [name], MD (Physician)             Addended by: GLENNON [name] on: 10/17/2024 01:48 PM     Modules accepted: Orders        Progress Notes by Glennon [name], MD at 10/17/2024 12:00 PM  Author: Glennon [name], MD Author Type: Physician Filed: 10/17/2024  1:31 PM  Note Status: Signed Cosign: Cosign Not Required Encounter Date: 10/17/2024  Editor: Glennon [name], MD (Physician)             Expand All Collapse All    Acute Office Visit   Subjective:    Subjective Patient ID: Joanne Wallace, female    DOB: 08-07-1996, 28 y.o.   MRN: [id number]   PREOP for RLH, bilateral salpingectomy, cystoscopy   HPI 28 y.o. presents today for Pre-op Exam (Pre-op/Pap-01/16/23/11/11/24- Total Hysterectomy ) .   Patient's last menstrual period was 10/04/2024 (exact date). Period Duration (Days): 6 Menstrual Flow: Heavy Menstrual Control: Other (Comment) (period underwear) Dysmenorrhea: (!) Severe (Mild to severe) Dysmenorrhea Symptoms: Cramping, Nausea, Diarrhea, Headache   Patient is a surgical consult from Jami C. Patient  with severe debilitating periods when she has them. Has heavy periods and can bleed twice a month or longer. She has tried ocp's in the past with no resolution or improvement of her symptoms. Has dyspareunia and her and her partner would not like to have any children. Her family has been aware of this as well for a long while that she did not want kids and has medical issues and does not want to have complications in an undesired pregnancy. Periods and the pain have affected her quality of life. Patient counseled that the hysterectomy is permanent and she will no longer be able to conceive, and she voiced understanding. Patient also with vagal responses with any cervical exam or during intercourse. She avoids intercourse because of the discomfort. GYN exams are extremely difficult as well and she feels removing the cervix with the hysterectomy will be beneficial. Patient counseled her pain could still continue, even with a hysterectomy and she is willing to take the risk.   Review of Systems       Objective:    Objective Physical Exam Constitutional:      Appearance: Normal appearance.  Musculoskeletal:     Cervical back: Normal range of motion.  Neurological:     Mental Status: She is alert and oriented to person, place, and time.  Psychiatric:        Mood and Affect: Mood normal.  Behavior: Behavior normal.  Vitals and nursing note reviewed.       BP 130/80   Pulse (!) 101   Ht 5' 5.16 (1.655 m)   Wt 190 lb (86.2 kg)   LMP 10/04/2024 (Exact Date)   SpO2 97%   BMI 31.47 kg/m     Wt Readings from Last 3 Encounters:  10/17/24 190 lb (86.2 kg)  08/03/24 188 lb 6.4 oz (85.5 kg)  07/21/24 190 lb (86.2 kg)          OB History       Gravida  0   Para  0   Term  0   Preterm  0   AB  0   Living  0        SAB  0   IAB  0   Ectopic  0   Multiple  0   Live Births  0                  Past Surgical History:  Procedure Laterality Date   HERNIA  REPAIR        double   TONSILLECTOMY AND ADENOIDECTOMY                Past Medical History:  Diagnosis Date   Anxiety 2014   Depression 2014   IBS (irritable bowel syndrome)     Mixed connective tissue disease     Neuromuscular disorder (HCC) 2021    mixed connective tissue disease        Social History         Socioeconomic History   Marital status: Single      Spouse name: Not on file   Number of children: Not on file   Years of education: Not on file   Highest education level: Associate degree: occupational, scientist, product/process development, or vocational program  Occupational History   Not on file  Tobacco Use   Smoking status: Former      Current packs/day: 0.00      Average packs/day: 0.3 packs/day for 7.0 years (1.8 ttl pk-yrs)      Types: Cigarettes      Start date: 2012      Quit date: 2019      Years since quitting: 6.8   Smokeless tobacco: Never   Tobacco comments:      Only tough when I am around smokers, but I've never had a craving I couldnt handle.  Vaping Use   Vaping status: Former  Substance and Sexual Activity   Alcohol use: Yes      Alcohol/week: 6.0 - 12.0 standard drinks of alcohol      Types: 6 - 12 Standard drinks or equivalent per week      Comment: 1 drink daily   Drug use: Yes      Frequency: 7.0 times per week      Types: Marijuana   Sexual activity: Yes      Partners: Male      Birth control/protection: Condom      Comment: menarche 28yo, sexual debut 28yo  Other Topics Concern   Not on file  Social History Narrative   Not on file    Social Drivers of Health        Financial Resource Strain: Medium Risk (01/14/2024)    Overall Financial Resource Strain (CARDIA)     Difficulty of Paying Living Expenses: Somewhat hard  Food Insecurity: Food Insecurity Present (01/14/2024)    Hunger Vital Sign  Worried About Programme Researcher, Broadcasting/film/video in the Last Year: Sometimes true     Ran Out of Food in the Last Year: Sometimes true  Transportation Needs: Unmet  Transportation Needs (01/14/2024)    PRAPARE - Therapist, Art (Medical): Yes     Lack of Transportation (Non-Medical): Yes  Physical Activity: Insufficiently Active (01/14/2024)    Exercise Vital Sign     Days of Exercise per Week: 3 days     Minutes of Exercise per Session: 30 min  Stress: Stress Concern Present (01/14/2024)    [employer] of Occupational Health - Occupational Stress Questionnaire     Feeling of Stress : Rather much  Social Connections: Moderately Isolated (01/14/2024)    Social Connection and Isolation Panel     Frequency of Communication with Friends and Family: Twice a week     Frequency of Social Gatherings with Friends and Family: Twice a week     Attends Religious Services: Never     Diplomatic Services Operational Officer: No     Attends Engineer, Structural: Not on file     Marital Status: Living with partner  Intimate Partner Violence: Not on file         Family History  Problem Relation Age of Onset   Breast cancer Mother     Hypothyroidism Mother     Depression Mother     Obesity Mother     Hypothyroidism Father     Obesity Father     Gout Father     Healthy Brother     Rheum arthritis Paternal Aunt     Osteoarthritis Paternal Aunt     Arthritis Maternal Grandmother     Diabetes Paternal Grandmother     Obesity Paternal Grandfather     Colon cancer Neg Hx     Rectal cancer Neg Hx          Medications Ordered Prior to Encounter        Current Outpatient Medications on File Prior to Visit  Medication Sig Dispense Refill   Cyanocobalamin  (B-12 PO) Take by mouth.       DULoxetine  (CYMBALTA ) 20 MG capsule Take 2 capsules (40 mg total) by mouth daily. 180 capsule 3   hydroxychloroquine  (PLAQUENIL ) 200 MG tablet TAKE 1 TABLET BY MOUTH TWICE DAILY MONDAY THROUGH FRIDAY ONLY 120 tablet 0   hydrOXYzine  (ATARAX ) 10 MG tablet Take 1 tablet (10 mg total) by mouth 3 (three) times daily as needed. 30 tablet 0    methocarbamol  (ROBAXIN ) 500 MG tablet TAKE 1 TABLET BY MOUTH EVERY DAY AS NEEDED 30 tablet 0   Multiple Vitamin (MULTIVITAMIN WITH MINERALS) TABS tablet Take 1 tablet by mouth daily.       Probiotic Product (PROBIOTIC PO) Take by mouth. Probiotic/prebiotic       thiamine  (VITAMIN B1) 100 MG tablet Take 1 tablet (100 mg total) by mouth daily. 90 tablet 3   Cyanocobalamin  (VITAMIN B 12 PO) Take by mouth daily. (Patient not taking: Reported on 10/17/2024)       dicyclomine  (BENTYL ) 10 MG capsule Take 1 capsule (10 mg total) by mouth 4 (four) times daily -  before meals and at bedtime. (Patient not taking: Reported on 10/17/2024) 90 capsule 2   estradiol  (ESTRACE  VAGINAL) 0.1 MG/GM vaginal cream Place 1 g vaginally 3 (three) times a week. As well as a pea sized amount to upper vulva and clitoris (Patient not taking: Reported on 10/17/2024)  127.5 g 3   metroNIDAZOLE  (FLAGYL ) 500 MG tablet Take 1 tablet (500 mg total) by mouth 2 (two) times daily. (Patient not taking: Reported on 10/17/2024) 14 tablet 0   oxybutynin  (DITROPAN ) 5 MG tablet Take 1 tablet (5 mg total) by mouth every 8 (eight) hours as needed for bladder spasms. (Patient not taking: Reported on 10/17/2024) 30 tablet 0   rOPINIRole  (REQUIP ) 0.5 MG tablet TAKE 1 TABLET BY MOUTH AT BEDTIME. (Patient not taking: Reported on 10/17/2024) 30 tablet 1    No current facility-administered medications on file prior to visit.      .   Assessment & Plan:  PREOP H&P for RLH, bilateral salpingectomy, cystoscopy Dysmenorrhea DUB Cervical pain Dyspareunia Difficulty with pelvic exams Desires definitive management with RLH Likely endometriosis   Counseled on all options.  She would like to have the [facility].  Counseled extensively on the procedure including but not limited to what to expect and risks and benefits.  Counseled on postop care and pelvic rest for 10 weeks after the surgery with restricted lifting for 6 weeks after.  Counseled on the benefits  of the robotic procedure with faster return to daily activities, improved outcomes, and less risk for complications.  Discussed risk of still having pain in the future and risk for additional surgeries in the future, if endometriosis is found. Discussed removal of as much lesions as possible, if in a safe areas.  She again voiced that she understands the hysterectomy is permanent and she cannot conceive in the future and wanted to proceed with the procedure.    The risks of surgery were discussed in detail with the patient including but not limited to: bleeding which may require transfusion or reoperation; infection which may require prolonged hospitalization or re-hospitalization and antibiotic therapy; injury to bowel, bladder, ureters and major vessels or other surrounding organs which may lead to other procedures; formation of adhesions; need for additional procedures including laparotomy or subsequent procedures secondary to intraoperative injury or abnormal pathology; thromboembolic phenomenon; incisional problems and other postoperative or anesthesia complications.  The postoperative expectations were also discussed in detail. The patient also understands the alternative treatment options which were discussed in full. All questions were answered.  Patient would like to proceed with the procedure.   Dr. Glennon     30 minutes spent on reviewing records, imaging,  and one on one patient time and counseling patient and documentation     Almarie MARLA Glennon

## 2024-11-11 NOTE — Op Note (Signed)
 11/11/2024  [id number] Joanne Wallace        OPERATIVE REPORT   Preop Diagnosis: menorrhagia, dysmenorrhea, anemia, fibroids  Procedure: robotic hysterectomy, bilateral salpingectomy, excision of endometriosis lesion, cystoscopy   Surgeon: Dr. Almarie Rollo Carpen Assistant:  Circulator: Raguel Sherra CROME, RN Scrub Person: Janell, Marina  CINDI Rubin Hadassah CHRISTELLA RN First Assistant: Starla Duwaine BROCKS, RN    Fluids: please see anesthesia report   Complications: None Anesthesia: General     Findings:  fibroid contour 8cm uterus, normal ovaries and tubes, stage 1 endometriosis with lesion seen on the left posterior side wall. Normal appendix Cystoscopy at the end of the case with normal bladder and patent ureters bilaterally.   Estimated blood loss: Minimal <15cc   Specimens: Uterus, cervix and bilateral tubes, excision of endometriosis lesion   Disposition of specimen: Pathology           Patient is taken to the operating room. She is placed in the supine position. She is a running IV in place. Informed consent was present on the chart. SCDs on her lower extremities and functioning properly. Patient was positioned while she was awake.  Her legs were placed in the low lithotomy position in [place] stirrups. Her arms were tucked by the side.  General endotracheal anesthesia was administered by the anesthesia staff without difficulty.       Dura prep was then used to prep the abdomen and Hibiclens was used to prep the inner thighs, perineum and vagina. Once 3 minutes had past the patient was draped in a normal standard fashion. A proper time out was performed and everyone agreed.  The legs were lifted to the high lithotomy position. A bivalve speculum was inserted into the vagina and the anterior lip of the cervix was grasped with single-tooth tenaculum.  The uterus sounded to 8 cm. Pratt dilators were used to dilate the cervix.  The RUMI uterine manipulator was obtained inserted into  the endometrial cavity and the bulb of the disposable tip was inflated with 8 cc of normal saline. There was a good fit of the KOH ring around the cervix. The tenaculum and bivavle speculum was removed. There is also good manipulation of the uterus.  A Foley catheter was placed to straight drain.  Clear urine was noted. Legs were lowered to the low lithotomy position and attention was turned the abdomen.   Superior to the umbilicus, marcaine 0.25% used to anesthetize the skin.  Using #11 blade, 8mm skin incision was made.  The 8mm robotic trocar and sleeve was inserted under direct visualization.  CO2 gas was  started and patient was placed in trendelenburg position.  Two additional 8mm ports were placed under direct visualization in the left and right lower quadrant.     Ureters were identifies.  Attention was turned to the left side. The left tube was elevated and the mesosalpinx was desiccated with the vessel sealer.  The left uterine ovarian pedicle was serially clamped cauterized and incised. Left round ligament was serially clamped cauterized and incised. The anterior and posterior peritoneum of the inferior leaf of the broad ligament were opened. The beginning of the bladder flap was created.  The bladder was taken down below the level of the KOH ring. The left uterine artery skeletonized and then just superior to the KOH ring this vessel was serially clamped, cauterized, and incised.   Attention was turned the right side.  The uterus was placed on stretch to the opposite side.    The  mesosalpinx was incised freeing the tube. Then the right uterine ovarian pedicle was serially clamped cauterized and incised. Next the right round ligament was serially clamped cauterized and incised. The anterior posterior peritoneum of the inferiorly for the broad ligament were opened. The anterior peritoneum was carried across to the dissection on the left side. The remainder of the bladder flap was created using sharp  dissection. The bladder was well below the level of the KOH ring. The right uterine artery skeletonized. Then the right uterine artery, above the level of the KOH ring, was serially clamped cauterized and incised. The uterus was devascularized at this point.   The colpotomy was performed.  This was carried around a circumferential fashion until the vaginal mucosa was completely incised in the specimen was freed.  The specimen was then delivered to the vagina intact.  A vaginal occlusive device was used to maintain the pneumoperitoneum   Instruments were changed with a needle driver and prograsp.  Using a 9 inch  zero V-lock suture, the cuff was closed by incorporating the anterior and posterior vaginal mucosa in each stitch. This was carried across all the way to the left corner and a running fashion. Two stitches were brought back towards the midline and the suture was cut flush with the vagina. The needle was brought out the pelvis. The pelvis was irrigated. All pedicles were inspected. No bleeding was noted.   Co2 pressures were lowered to 8mm Hg.  Again, no bleeding was noted.  Ureters were noted deep in the pelvis to be peristalsing.  At this point the procedure was completed.  The remaining instruments were removed.  The ports were removed under direct visualization of the laparoscope and the pneumoperitoneum was relieved.   The skin was then closed with subcuticular stitches of 3-0 Vicryl. The skin was cleansed Dermabond was applied. Attention was then turned the vagina and the cuff was inspected. No bleeding was noted.  The Foley catheter was removed.  Cystoscopy was performed.  No sutures or bladder injuries were noted.  Ureters were noted with normal urine jets from each one was seen.  Foley was left out after the cystoscopic fluid was drained and cystoscope removed.  Sponge, lap, needle, instrument counts were correct x2. Patient tolerated the procedure very well. She was awakened from anesthesia,  extubated and taken to recovery in stable condition.      Dr. Glennon

## 2024-11-11 NOTE — Transfer of Care (Signed)
 Immediate Anesthesia Transfer of Care Note  Patient: Joanne Wallace  Procedure(s) Performed: HYSTERECTOMY, TOTAL, LAPAROSCOPIC, ROBOT-ASSISTED WITH SALPINGECTOMY (Bilateral: Pelvis) CYSTOSCOPY (Bladder) EXCISION, ENDOMETRIOSIS, ROBOTIC ASSISTED, LAPAROSCOPIC (Abdomen)  Patient Location: PACU  Anesthesia Type:General  Level of Consciousness: awake, alert , oriented, sedated, and patient cooperative  Airway & Oxygen Therapy: Patient Spontanous Breathing and Patient connected to nasal cannula oxygen  Post-op Assessment: Report given to RN, Post -op Vital signs reviewed and stable, and Patient moving all extremities  Post vital signs: Reviewed and stable  Last Vitals:  Vitals Value Taken Time  BP 116/74 11/11/24 14:51  Temp    Pulse 97 11/11/24 14:52  Resp 22 11/11/24 14:52  SpO2 97 % 11/11/24 14:52  Vitals shown include unfiled device data.  Last Pain:  Vitals:   11/11/24 1131  TempSrc: Oral  PainSc: 0-No pain      Patients Stated Pain Goal: 6 (11/11/24 1131)  Complications: There were no known notable events for this encounter.

## 2024-11-11 NOTE — Anesthesia Preprocedure Evaluation (Addendum)
 Anesthesia Evaluation  Patient identified by MRN, date of birth, ID band Patient awake    Reviewed: Allergy & Precautions, NPO status , Patient's Chart, lab work & pertinent test results  History of Anesthesia Complications (+) AWARENESS UNDER ANESTHESIA and history of anesthetic complications  Airway Mallampati: I  TM Distance: >3 FB Neck ROM: Full    Dental  (+) Teeth Intact, Dental Advisory Given   Pulmonary Current Smoker and Patient abstained from smoking.   Pulmonary exam normal breath sounds clear to auscultation       Cardiovascular Exercise Tolerance: Good (-) hypertensionnegative cardio ROS Normal cardiovascular exam Rhythm:Regular Rate:Normal     Neuro/Psych  PSYCHIATRIC DISORDERS Anxiety Depression     Neuromuscular disease    GI/Hepatic negative GI ROS,,,(+)     substance abuse  alcohol use and marijuana use  Endo/Other  Obesity   Renal/GU negative Renal ROS     Musculoskeletal negative musculoskeletal ROS (+)    Abdominal   Peds  Hematology negative hematology ROS (+)   Anesthesia Other Findings Day of surgery medications reviewed with the patient.  Reproductive/Obstetrics Dysmenorrhea  Menorrhagia with irregular cycle Dysfunctional uterine bleeding                                Anesthesia Physical Anesthesia Plan  ASA: 2  Anesthesia Plan: General   Post-op Pain Management: Tylenol  PO (pre-op)* and Toradol IV (intra-op)*   Induction: Intravenous  PONV Risk Score and Plan: 4 or greater and Midazolam, Scopolamine patch - Pre-op, Dexamethasone and Ondansetron  Airway Management Planned: Oral ETT  Additional Equipment:   Intra-op Plan:   Post-operative Plan: Extubation in OR  Informed Consent: I have reviewed the patients History and Physical, chart, labs and discussed the procedure including the risks, benefits and alternatives for the proposed anesthesia  with the patient or authorized representative who has indicated his/her understanding and acceptance.     Dental advisory given  Plan Discussed with: CRNA  Anesthesia Plan Comments: (2nd PIV)         Anesthesia Quick Evaluation

## 2024-11-12 NOTE — Anesthesia Postprocedure Evaluation (Signed)
 Anesthesia Post Note  Patient: Joanne Wallace  Procedure(s) Performed: HYSTERECTOMY, TOTAL, LAPAROSCOPIC, ROBOT-ASSISTED WITH SALPINGECTOMY (Bilateral: Pelvis) CYSTOSCOPY (Bladder) EXCISION, ENDOMETRIOSIS, ROBOTIC ASSISTED, LAPAROSCOPIC (Abdomen)     Patient location during evaluation: PACU Anesthesia Type: General Level of consciousness: awake and alert Pain management: pain level controlled Vital Signs Assessment: post-procedure vital signs reviewed and stable Respiratory status: spontaneous breathing, nonlabored ventilation, respiratory function stable and patient connected to nasal cannula oxygen Cardiovascular status: blood pressure returned to baseline and stable Postop Assessment: no apparent nausea or vomiting Anesthetic complications: no   There were no known notable events for this encounter.  Last Vitals:  Vitals:   11/11/24 1530 11/11/24 1545  BP: 119/75 119/78  Pulse: 90 90  Resp: 19 18  Temp:  36.9 C  SpO2: 93% 95%    Last Pain:  Vitals:   11/11/24 1545  TempSrc:   PainSc: 3                  Luanna Weesner L Kenlyn Lose

## 2024-11-13 ENCOUNTER — Encounter (HOSPITAL_COMMUNITY): Payer: Self-pay | Admitting: Obstetrics and Gynecology

## 2024-11-14 ENCOUNTER — Ambulatory Visit: Payer: Self-pay | Admitting: Obstetrics and Gynecology

## 2024-11-14 LAB — SURGICAL PATHOLOGY

## 2024-11-15 ENCOUNTER — Encounter: Payer: Self-pay | Admitting: Obstetrics and Gynecology

## 2024-11-21 ENCOUNTER — Telehealth: Payer: Self-pay | Admitting: *Deleted

## 2024-11-21 NOTE — Telephone Encounter (Signed)
 Spoke with patient. Patient is s/p RLH BS on 11/11/24.   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   Confirmed no longer taking narcotic pain medications. Advised ok to resume driving if she feels comfortable driving. Advised I will provide update to Dr. Glennon, our office will return call if any additional recommendations. Patient agreeable.   Routing to provider for final review. Patient is agreeable to disposition. Will close encounter.

## 2024-11-25 ENCOUNTER — Ambulatory Visit: Payer: Self-pay | Admitting: Obstetrics and Gynecology

## 2024-11-25 ENCOUNTER — Encounter: Payer: Self-pay | Admitting: Obstetrics and Gynecology

## 2024-11-25 ENCOUNTER — Ambulatory Visit (INDEPENDENT_AMBULATORY_CARE_PROVIDER_SITE_OTHER): Admitting: Obstetrics and Gynecology

## 2024-11-25 VITALS — BP 116/78 | HR 89

## 2024-11-25 DIAGNOSIS — N898 Other specified noninflammatory disorders of vagina: Secondary | ICD-10-CM

## 2024-11-25 DIAGNOSIS — Z09 Encounter for follow-up examination after completed treatment for conditions other than malignant neoplasm: Secondary | ICD-10-CM

## 2024-11-25 LAB — WET PREP FOR TRICH, YEAST, CLUE

## 2024-11-25 NOTE — Progress Notes (Signed)
" ° °  Acute Office Visit  Subjective:    Patient ID: Joanne Wallace, female    DOB: [birth year], 28 y.o.   MRN: [id number]   HPI 28 y.o. presents today for Post-op Follow-up (Post op 2 week 11/11/24- Total Hysterectomy ) . C/o vaginal itching. No VB, fevers or dysuria No severe abdominal pain Patient's last menstrual period was 10/04/2024.     Pathology: fibroids, endometriosis Review of Systems     Objective:    OBGyn Exam  BP 116/78 (BP Location: Left Arm, Patient Position: Sitting)   Pulse 89   LMP 10/04/2024   SpO2 97%  Wt Readings from Last 3 Encounters:  11/11/24 190 lb (86.2 kg)  10/17/24 190 lb (86.2 kg)  08/03/24 188 lb 6.4 oz (85.5 kg)        Self swab collected to rule out yeast Assessment & Plan:  2 wk PO RLH doing well Vaginal itching  Pelvic rest until cleared No heavy lifting x6 weeks 13lbs or greater RTC in 6 weeks or sooner with any concerns Wet mount sent to rule out yeast Dr. Glennon Almarie MARLA Glennon "

## 2024-12-22 ENCOUNTER — Ambulatory Visit: Payer: Self-pay | Admitting: Obstetrics and Gynecology

## 2024-12-22 ENCOUNTER — Ambulatory Visit (INDEPENDENT_AMBULATORY_CARE_PROVIDER_SITE_OTHER): Admitting: Obstetrics and Gynecology

## 2024-12-22 ENCOUNTER — Encounter: Payer: Self-pay | Admitting: Obstetrics and Gynecology

## 2024-12-22 VITALS — BP 116/78 | HR 93

## 2024-12-22 DIAGNOSIS — Z09 Encounter for follow-up examination after completed treatment for conditions other than malignant neoplasm: Secondary | ICD-10-CM

## 2024-12-22 DIAGNOSIS — N898 Other specified noninflammatory disorders of vagina: Secondary | ICD-10-CM

## 2024-12-22 LAB — WET PREP FOR TRICH, YEAST, CLUE

## 2024-12-22 NOTE — Progress Notes (Signed)
" ° °  Acute Office Visit  Subjective:    Patient ID: Joanne Wallace, female    DOB: 28-Aug-1996, 29 y.o.   MRN: [id number]   HPI 29 y.o. presents today for Post-op Follow-up (Post op 6 week 11/11/24- Total Hysterectomy/Has been having some morning sickness and troubles with bowl movements ) .loose stool that resolved Noted these symptoms before her cycle would start before the [facility] No fevers, VB, pain Patient's last menstrual period was 10/04/2024.    Review of Systems     Objective:    OBGyn Exam  BP 116/78 (BP Location: Left Arm, Patient Position: Sitting)   Pulse 93   LMP 10/04/2024   SpO2 98%  Wt Readings from Last 3 Encounters:  11/11/24 190 lb (86.2 kg)  10/17/24 190 lb (86.2 kg)  08/03/24 188 lb 6.4 oz (85.5 kg)        SVE: cuff intact with good support. Possible bv discharge No vb suture seen and dissolving  Assessment & Plan:  6wk PO RLH doing well  Continue pelvic rest Wet mount sent Dr. Glennon Almarie MARLA Glennon  "

## 2025-01-06 ENCOUNTER — Other Ambulatory Visit: Payer: Self-pay | Admitting: Internal Medicine

## 2025-01-16 ENCOUNTER — Ambulatory Visit: Admitting: Physician Assistant

## 2025-01-16 DIAGNOSIS — M7061 Trochanteric bursitis, right hip: Secondary | ICD-10-CM

## 2025-01-16 DIAGNOSIS — M79671 Pain in right foot: Secondary | ICD-10-CM

## 2025-01-16 DIAGNOSIS — Z79899 Other long term (current) drug therapy: Secondary | ICD-10-CM

## 2025-01-16 DIAGNOSIS — Z8269 Family history of other diseases of the musculoskeletal system and connective tissue: Secondary | ICD-10-CM

## 2025-01-16 DIAGNOSIS — Z8261 Family history of arthritis: Secondary | ICD-10-CM

## 2025-01-16 DIAGNOSIS — F32A Depression, unspecified: Secondary | ICD-10-CM

## 2025-01-16 DIAGNOSIS — M79641 Pain in right hand: Secondary | ICD-10-CM

## 2025-01-16 DIAGNOSIS — M351 Other overlap syndromes: Secondary | ICD-10-CM

## 2025-01-16 DIAGNOSIS — G8929 Other chronic pain: Secondary | ICD-10-CM

## 2025-01-16 DIAGNOSIS — R5383 Other fatigue: Secondary | ICD-10-CM

## 2025-01-16 DIAGNOSIS — M25551 Pain in right hip: Secondary | ICD-10-CM

## 2025-01-16 DIAGNOSIS — J302 Other seasonal allergic rhinitis: Secondary | ICD-10-CM

## 2025-01-16 DIAGNOSIS — Z8719 Personal history of other diseases of the digestive system: Secondary | ICD-10-CM

## 2025-01-16 DIAGNOSIS — M542 Cervicalgia: Secondary | ICD-10-CM

## 2025-01-16 DIAGNOSIS — K529 Noninfective gastroenteritis and colitis, unspecified: Secondary | ICD-10-CM

## 2025-01-16 DIAGNOSIS — M7918 Myalgia, other site: Secondary | ICD-10-CM

## 2025-01-23 ENCOUNTER — Encounter: Admitting: Obstetrics and Gynecology

## 2025-01-27 ENCOUNTER — Ambulatory Visit: Payer: 59 | Admitting: Radiology
